# Patient Record
Sex: Female | Born: 1955 | Race: White | Hispanic: No | Marital: Married | Smoking: Never smoker
Health system: Southern US, Community
[De-identification: ages and names within clinical notes are randomized; demographics above are authoritative.]

## PROBLEM LIST (undated history)

## (undated) DIAGNOSIS — G4700 Insomnia, unspecified: Secondary | ICD-10-CM

## (undated) DIAGNOSIS — M8580 Other specified disorders of bone density and structure, unspecified site: Secondary | ICD-10-CM

## (undated) DIAGNOSIS — E785 Hyperlipidemia, unspecified: Secondary | ICD-10-CM

## (undated) HISTORY — DX: Other specified disorders of bone density and structure, unspecified site: M85.80

## (undated) HISTORY — PX: OTHER SURGICAL HISTORY: SHX169

## (undated) HISTORY — DX: Hyperlipidemia, unspecified: E78.5

## (undated) HISTORY — DX: Insomnia, unspecified: G47.00

---

## 1997-02-13 HISTORY — PX: OOPHORECTOMY: SHX86

## 1998-04-01 ENCOUNTER — Other Ambulatory Visit: Admission: RE | Admit: 1998-04-01 | Discharge: 1998-04-01 | Payer: Self-pay | Admitting: Obstetrics and Gynecology

## 1999-04-12 ENCOUNTER — Other Ambulatory Visit: Admission: RE | Admit: 1999-04-12 | Discharge: 1999-04-12 | Payer: Self-pay | Admitting: Obstetrics and Gynecology

## 2000-04-24 ENCOUNTER — Other Ambulatory Visit: Admission: RE | Admit: 2000-04-24 | Discharge: 2000-04-24 | Payer: Self-pay | Admitting: Obstetrics and Gynecology

## 2001-05-13 ENCOUNTER — Other Ambulatory Visit: Admission: RE | Admit: 2001-05-13 | Discharge: 2001-05-13 | Payer: Self-pay | Admitting: Obstetrics and Gynecology

## 2002-05-26 ENCOUNTER — Other Ambulatory Visit: Admission: RE | Admit: 2002-05-26 | Discharge: 2002-05-26 | Payer: Self-pay | Admitting: Obstetrics & Gynecology

## 2003-06-05 ENCOUNTER — Other Ambulatory Visit: Admission: RE | Admit: 2003-06-05 | Discharge: 2003-06-05 | Payer: Self-pay | Admitting: Obstetrics & Gynecology

## 2005-06-16 ENCOUNTER — Encounter: Admission: RE | Admit: 2005-06-16 | Discharge: 2005-06-16 | Payer: Self-pay | Admitting: Family Medicine

## 2005-07-09 ENCOUNTER — Emergency Department (HOSPITAL_COMMUNITY): Admission: EM | Admit: 2005-07-09 | Discharge: 2005-07-09 | Payer: Self-pay | Admitting: Emergency Medicine

## 2005-08-15 ENCOUNTER — Ambulatory Visit: Payer: Self-pay | Admitting: Family Medicine

## 2006-06-08 DIAGNOSIS — E785 Hyperlipidemia, unspecified: Secondary | ICD-10-CM

## 2006-06-08 DIAGNOSIS — G43909 Migraine, unspecified, not intractable, without status migrainosus: Secondary | ICD-10-CM | POA: Insufficient documentation

## 2006-06-08 DIAGNOSIS — F329 Major depressive disorder, single episode, unspecified: Secondary | ICD-10-CM

## 2006-06-08 DIAGNOSIS — F32A Depression, unspecified: Secondary | ICD-10-CM | POA: Insufficient documentation

## 2006-06-08 DIAGNOSIS — G4700 Insomnia, unspecified: Secondary | ICD-10-CM | POA: Insufficient documentation

## 2007-03-05 ENCOUNTER — Ambulatory Visit: Payer: Self-pay | Admitting: Family Medicine

## 2007-03-06 LAB — CONVERTED CEMR LAB
Basophils Relative: 0.6 % (ref 0.0–1.0)
Cholesterol: 184 mg/dL (ref 0–200)
Eosinophils Relative: 4.4 % (ref 0.0–5.0)
LDL Cholesterol: 101 mg/dL — ABNORMAL HIGH (ref 0–99)
Lymphocytes Relative: 24.5 % (ref 12.0–46.0)
MCHC: 35.2 g/dL (ref 30.0–36.0)
Monocytes Absolute: 0.3 10*3/uL (ref 0.2–0.7)
Neutro Abs: 3.5 10*3/uL (ref 1.4–7.7)
Platelets: 223 10*3/uL (ref 150–400)
RBC: 4.02 M/uL (ref 3.87–5.11)
TSH: 1.4 microintl units/mL (ref 0.35–5.50)
Total CHOL/HDL Ratio: 2.9

## 2007-03-07 ENCOUNTER — Encounter (INDEPENDENT_AMBULATORY_CARE_PROVIDER_SITE_OTHER): Payer: Self-pay | Admitting: *Deleted

## 2007-05-01 ENCOUNTER — Ambulatory Visit: Payer: Self-pay | Admitting: Gastroenterology

## 2007-08-07 ENCOUNTER — Ambulatory Visit: Payer: Self-pay | Admitting: Gastroenterology

## 2007-08-14 ENCOUNTER — Ambulatory Visit: Payer: Self-pay | Admitting: Gastroenterology

## 2007-08-14 HISTORY — PX: COLONOSCOPY: SHX174

## 2008-07-30 ENCOUNTER — Ambulatory Visit: Payer: Self-pay | Admitting: Family Medicine

## 2008-07-30 DIAGNOSIS — M25529 Pain in unspecified elbow: Secondary | ICD-10-CM | POA: Insufficient documentation

## 2008-07-30 LAB — CONVERTED CEMR LAB
ALT: 20 units/L (ref 0–35)
Alkaline Phosphatase: 46 units/L (ref 39–117)
Bilirubin, Direct: 0.1 mg/dL (ref 0.0–0.3)
CO2: 30 meq/L (ref 19–32)
Calcium: 8.8 mg/dL (ref 8.4–10.5)
Chloride: 103 meq/L (ref 96–112)
Creatinine, Ser: 0.7 mg/dL (ref 0.4–1.2)
Eosinophils Absolute: 0.4 10*3/uL (ref 0.0–0.7)
Eosinophils Relative: 6.7 % — ABNORMAL HIGH (ref 0.0–5.0)
HCT: 38.4 % (ref 36.0–46.0)
HDL: 60.3 mg/dL (ref >39.00)
Hemoglobin: 13.8 g/dL (ref 12.0–15.0)
LDL Cholesterol: 116 mg/dL — ABNORMAL HIGH (ref 0–99)
MCV: 94.8 fL (ref 78.0–100.0)
Monocytes Absolute: 0.4 10*3/uL (ref 0.1–1.0)
Neutro Abs: 4.1 10*3/uL (ref 1.4–7.7)
RBC: 4.05 M/uL (ref 3.87–5.11)
Sodium: 141 meq/L (ref 135–145)
Total CHOL/HDL Ratio: 3
WBC: 6.3 10*3/uL (ref 4.5–10.5)

## 2008-07-31 ENCOUNTER — Encounter (INDEPENDENT_AMBULATORY_CARE_PROVIDER_SITE_OTHER): Payer: Self-pay | Admitting: *Deleted

## 2009-03-05 ENCOUNTER — Ambulatory Visit: Payer: Self-pay | Admitting: Internal Medicine

## 2009-09-06 ENCOUNTER — Ambulatory Visit: Payer: Self-pay | Admitting: Family Medicine

## 2009-09-06 DIAGNOSIS — K1370 Unspecified lesions of oral mucosa: Secondary | ICD-10-CM | POA: Insufficient documentation

## 2009-09-07 ENCOUNTER — Encounter: Payer: Self-pay | Admitting: Family Medicine

## 2009-09-08 ENCOUNTER — Telehealth (INDEPENDENT_AMBULATORY_CARE_PROVIDER_SITE_OTHER): Payer: Self-pay | Admitting: *Deleted

## 2009-09-08 LAB — CONVERTED CEMR LAB
ALT: 32 units/L (ref 0–35)
AST: 21 units/L (ref 0–37)
Alkaline Phosphatase: 50 units/L (ref 39–117)
Basophils Absolute: 0 10*3/uL (ref 0.0–0.1)
Basophils Relative: 0.7 % (ref 0.0–3.0)
Calcium: 9.5 mg/dL (ref 8.4–10.5)
Chloride: 102 meq/L (ref 96–112)
Cholesterol: 256 mg/dL — ABNORMAL HIGH (ref 0–200)
Direct LDL: 170.5 mg/dL
Eosinophils Absolute: 0.2 10*3/uL (ref 0.0–0.7)
Eosinophils Relative: 3.7 % (ref 0.0–5.0)
GFR calc non Af Amer: 102.58 mL/min (ref >60)
Glucose, Bld: 89 mg/dL (ref 70–99)
HDL: 65.8 mg/dL (ref >39.00)
Monocytes Absolute: 0.3 10*3/uL (ref 0.1–1.0)
Monocytes Relative: 5.3 % (ref 3.0–12.0)
Neutro Abs: 3.4 10*3/uL (ref 1.4–7.7)
Platelets: 221 10*3/uL (ref 150.0–400.0)
RDW: 12.7 % (ref 11.5–14.6)
Total Bilirubin: 1 mg/dL (ref 0.3–1.2)
Total CHOL/HDL Ratio: 4
WBC: 5.7 10*3/uL (ref 4.5–10.5)

## 2009-11-19 ENCOUNTER — Ambulatory Visit: Payer: Self-pay | Admitting: Family Medicine

## 2009-11-23 LAB — CONVERTED CEMR LAB
Albumin: 5.2 g/dL (ref 3.5–5.2)
Alkaline Phosphatase: 53 units/L (ref 39–117)
Bilirubin, Direct: 0.2 mg/dL (ref 0.0–0.3)
Indirect Bilirubin: 0.7 mg/dL (ref 0.0–0.9)
Total Bilirubin: 0.9 mg/dL (ref 0.3–1.2)

## 2009-12-02 ENCOUNTER — Encounter: Payer: Self-pay | Admitting: Family Medicine

## 2009-12-02 ENCOUNTER — Ambulatory Visit: Payer: Self-pay | Admitting: Family Medicine

## 2009-12-02 DIAGNOSIS — D485 Neoplasm of uncertain behavior of skin: Secondary | ICD-10-CM

## 2010-01-24 ENCOUNTER — Telehealth (INDEPENDENT_AMBULATORY_CARE_PROVIDER_SITE_OTHER): Payer: Self-pay | Admitting: *Deleted

## 2010-02-15 ENCOUNTER — Ambulatory Visit
Admission: RE | Admit: 2010-02-15 | Discharge: 2010-02-15 | Payer: Self-pay | Source: Home / Self Care | Attending: Family Medicine | Admitting: Family Medicine

## 2010-02-15 DIAGNOSIS — M7580 Other shoulder lesions, unspecified shoulder: Secondary | ICD-10-CM

## 2010-03-02 ENCOUNTER — Telehealth: Payer: Self-pay | Admitting: Family Medicine

## 2010-03-07 ENCOUNTER — Ambulatory Visit: Admit: 2010-03-07 | Payer: Self-pay | Admitting: Family Medicine

## 2010-03-15 NOTE — Assessment & Plan Note (Signed)
Summary: growth in mouth//lch   Vital Signs:  Patient profile:   55 year old female Weight:      113 pounds BP sitting:   120 / 74  (left arm)  Vitals Entered By: Doristine Devoid CMA (September 06, 2009 9:48 AM) CC: sore in mouth x3 months    History of Present Illness: 55 yo woman here today for mouth sore.  first appeared 3 months ago.  'it's not bad today'.  'when something bothers it the whole area will get red'.  'it will look like a pimple'.  has never drained.  will get painful when 'it gets a whitehead'.  no fevers.  remote hx of cold sores.  has been evaluated by dentist- 'he told me not to worry about it'.  Problems Prior to Update: 1)  Other&unspecified Diseases The Oral Soft Tissues  (ICD-528.9) 2)  Elbow Pain, Left  (ICD-719.42) 3)  Screening Colorectal-cancer  (ICD-V76.51) 4)  Well Adult Exam  (ICD-V70.0) 5)  Family History Diabetes 1st Degree Relative  (ICD-V18.0) 6)  Insomnia  (ICD-780.52) 7)  Migraine Headache  (ICD-346.90) 8)  Hyperlipidemia  (ICD-272.4) 9)  Depression  (ICD-311)  Current Medications (verified): 1)  Aspirin 81 Mg  Tabs (Aspirin) .... One Tablet By Mouth Once Daily 2)  Fish Oil   Oil (Fish Oil) .... One Tablet By Mouth Once Daily 3)  Calcium Carbonate-Vitamin D 600-400 Mg-Unit  Tabs (Calcium Carbonate-Vitamin D) .... One Tablet By Mouth Once Daily 4)  Niacin 500 Mg  Tabs (Niacin) .... Take 1 Tablet By Mouth Once A Day 5)  Maxalt-Mlt 10 Mg Tbdp (Rizatriptan Benzoate) .Marland Kitchen.. 1 By Mouth Once Daily As Needed  Allergies (verified): 1)  ! Penicillin  Review of Systems      See HPI  Physical Exam  General:  alert, well-developed, and well-nourished.   Mouth:  <1 cm firm, pearly mass on hard palate.  nontender.   Impression & Recommendations:  Problem # 1:  OTHER&UNSPECIFIED DISEASES THE ORAL SOFT TISSUES (ICD-528.9) Assessment New small, firm mass on hard palate.  appears to be a cyst.  defer tx to dentist or peridontist.  Complete Medication  List: 1)  Aspirin 81 Mg Tabs (Aspirin) .... One tablet by mouth once daily 2)  Fish Oil Oil (Fish oil) .... One tablet by mouth once daily 3)  Calcium Carbonate-vitamin D 600-400 Mg-unit Tabs (Calcium carbonate-vitamin d) .... One tablet by mouth once daily 4)  Niacin 500 Mg Tabs (Niacin) .... Take 1 tablet by mouth once a day 5)  Maxalt-mlt 10 Mg Tbdp (Rizatriptan benzoate) .Marland Kitchen.. 1 by mouth once daily as needed  Other Orders: Venipuncture (04540) TLB-Lipid Panel (80061-LIPID) TLB-Hepatic/Liver Function Pnl (80076-HEPATIC) TLB-BMP (Basic Metabolic Panel-BMET) (80048-METABOL) TLB-CBC Platelet - w/Differential (85025-CBCD) TLB-TSH (Thyroid Stimulating Hormone) (84443-TSH) T-Vitamin D (25-Hydroxy) (98119-14782) Specimen Handling (95621)  Patient Instructions: 1)  Please schedule your physical at your convenience- you can eat before this appt 2)  Please discuss your mouth sore with your dentist- it appears to be a small cyst 3)  If you don't make any headway w/ your dentist- let me know- we can refer you to a peridontist.  Prevention & Chronic Care Immunizations   Influenza vaccine: Historical  (12/29/2008)    Tetanus booster: 02/13/1998: given    Pneumococcal vaccine: Not documented  Colorectal Screening   Hemoccult: Not documented    Colonoscopy: Location:  [facility].    (08/14/2007)   Colonoscopy due: 08/2017  Other Screening   Pap smear: Not  documented    Mammogram: normal  (11/02/2008)   Smoking status: never  (03/05/2009)  Lipids   Total Cholesterol: 200  (07/30/2008)   LDL: 116  (07/30/2008)   LDL Direct: Not documented   HDL: 60.30  (07/30/2008)   Triglycerides: 121.0  (07/30/2008)    SGOT (AST): 22  (07/30/2008)   SGPT (ALT): 20  (07/30/2008)   Alkaline phosphatase: 46  (07/30/2008)   Total bilirubin: 1.1  (07/30/2008)  Self-Management Support :    Lipid self-management support: Not documented

## 2010-03-15 NOTE — Assessment & Plan Note (Signed)
Summary: acute/ migraines nausea/ pt of Dr Tabori/alr   Vital Signs:  Patient profile:   55 year old female Weight:      115 pounds BMI:     20.12 O2 Sat:      99 % on Room air Temp:     97.9 degrees F oral Pulse rate:   70 / minute Pulse rhythm:   regular Resp:     20 per minute BP sitting:   100 / 70  (left arm) Cuff size:   regular  Vitals Entered By: Glendell Docker CMA (March 05, 2009 11:29 AM)  O2 Flow:  Room air  Primary Care Provider:  Neena Rhymes MD  CC:  Headache.  History of Present Illness: c/o headache since Wednesday, with sensitivity ot light, but not sound, nausea but no vomiting. She has taken one dose of Imitrex yesterday with no relief.  some sinus congestion.  it feels like she may have cold.  Preventive Screening-Counseling & Management  Alcohol-Tobacco     Smoking Status: never  Allergies: 1)  ! 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   Ears:  R ear normal and L ear normal.   Mouth:  No deformity or lesions, dentition normal. Neck:  No deformities, masses, or tenderness noted. Lungs:  Normal respiratory effort, chest expands symmetrically. Lungs are clear to auscultation, no crackles or wheezes. Heart:  Normal rate and regular rhythm. S1 and S2 normal without gallop, murmur, click, rub or other extra sounds. Neurologic:  cranial nerves II-XII intact and gait normal.     Impression &  Recommendations:  Problem # 1:  MIGRAINE HEADACHE (ICD-346.90) 55 y/o with refractory migraine.  phenergen 12.5 mg IM x 1.  pt advised to take advil and maxal mlt together.  use prednisone as directed.  Patient advised to call office if symptoms persist or worsen.  The following medications were removed from the medication list:    Imitrex 50 Mg Tabs (Sumatriptan succinate) .Marland Kitchen... Take 1 tablet by mouth two times a day as needed migraine Her updated medication list for this problem includes:    Aspirin 81 Mg Tabs (Aspirin) ..... One tablet by mouth once daily    Maxalt-mlt 10 Mg Tbdp (Rizatriptan benzoate) .Marland Kitchen... 1 by mouth once daily as needed  Complete Medication List: 1)  Aspirin 81 Mg Tabs (Aspirin) .... One tablet by mouth once daily 2)  Fish Oil Oil (Fish oil) .... One tablet by mouth once daily 3)  Calcium Carbonate-vitamin D 600-400 Mg-unit Tabs (Calcium carbonate-vitamin d) .... One tablet by mouth once daily 4)  Niacin 500 Mg Tabs (Niacin) .... Take 1 tablet by mouth once a day 5)  Maxalt-mlt 10 Mg Tbdp (Rizatriptan benzoate) .Marland Kitchen.. 1 by mouth once daily as needed 6)  Prednisone 10 Mg Tabs (Prednisone) .... One by mouth two times a day x 3 days, one by mouth once daily x 3 days, than 1/2 tab x 4 days  Patient Instructions: 1)  Call our office  if your symptoms do not  improve or gets worse. 2)  take 400-600 mg of ibuprofen with maxalt. Prescriptions: PREDNISONE 10 MG TABS (PREDNISONE) one by mouth two times a day x 3 days, one by mouth once daily x 3 days, than 1/2 tab x 4 days  #11 x 0   Entered and Authorized by:   D. Thomos Lemons DO   Signed by:   D. Thomos Lemons DO on 03/05/2009   Method used:   Electronically to        [facility] [phone]* (retail)       [address]       [place], [place]  [address]       Ph: [phone]       Fax: [phone]   RxID:   [id number]    Immunization History:  Influenza Immunization History:    Influenza:  historical  (12/29/2008)    Preventive Care Screening  Mammogram:    Date:  11/02/2008    Results:  normal    Current Allergies (reviewed today): ! PENICILLIN

## 2010-03-15 NOTE — Progress Notes (Signed)
Summary: labs-  Phone Note Outgoing Call   Call placed by: Doristine Devoid CMA,  September 08, 2009 11:38 AM Call placed to: Patient Summary of Call: LDL has increased from 116-->170.5.  needs to start Simvastatin 40mg  at bedtime and repeat LFTs in 6-8 weeks  Follow-up for Phone Call        left message on machine .......Marland KitchenDoristine Devoid CMA  September 08, 2009 11:39 AM   spoke w/ patient aware of labs and that medication to be started also mailed copy of labs........Marland KitchenDoristine Devoid CMA  September 08, 2009 4:14 PM     New/Updated Medications: SIMVASTATIN 40 MG TABS (SIMVASTATIN) take one tablet at bedtime Prescriptions: SIMVASTATIN 40 MG TABS (SIMVASTATIN) take one tablet at bedtime  #30 x 3   Entered by:   Doristine Devoid CMA   Authorized by:   Neena Rhymes MD   Signed by:   Doristine Devoid CMA on 09/08/2009   Method used:   Electronically to        [facility] [address] #[id number]* (retail)       [address]       [place], [place]  [address]       Ph: [phone]       Fax: [phone]   RxID:   [id number]

## 2010-03-15 NOTE — Assessment & Plan Note (Signed)
Summary: cpx/cbs   Vital Signs:  Patient profile:   55 year old female Height:      63.50 inches Weight:      115 pounds BMI:     20.12 Pulse rate:   115 / minute BP sitting:   102 / 60  (left arm)  Vitals Entered By: Doristine Devoid CMA (December 02, 2009 3:24 PM) CC: CPX   History of Present Illness: 55 yo woman here today for CPE.  GYN- Cousins  1) hyperlipidemia- tolerating statin w/out difficulty.  no N/V, abd pain, myalgias.  LFTs at last check were normal.  Preventive Screening-Counseling & Management  Alcohol-Tobacco     Alcohol drinks/day: <1     Smoking Status: never  Caffeine-Diet-Exercise     Does Patient Exercise: no      Sexual History:  currently monogamous.        Drug Use:  never.    Current Medications (verified): 1)  Aspirin 81 Mg  Tabs (Aspirin) .... One Tablet By Mouth Once Daily 2)  Fish Oil   Oil (Fish Oil) .... One Tablet By Mouth Once Daily 3)  Calcium Carbonate-Vitamin D 600-400 Mg-Unit  Tabs (Calcium Carbonate-Vitamin D) .... One Tablet By Mouth Once Daily 4)  Maxalt-Mlt 10 Mg Tbdp (Rizatriptan Benzoate) .Marland Kitchen.. 1 By Mouth Once Daily As Needed 5)  Simvastatin 40 Mg Tabs (Simvastatin) .... Take One Tablet At Bedtime  Allergies (verified): 1)  ! 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  Past Medical History: Reviewed history from Mar 10, 2009 and no changes required. Depression Hyperlipidemia      Past  Surgical History: Reviewed history from 2009/03/10 and no changes required. Left oopherectomy-dermoid cyst, 1999     Family History: Reviewed history from 2009/03/10 and no changes required. parkinson: Father- deceased at age 84 Family History Diabetes 1st degree relative: mother Family History High cholesterol Family History Hypertension      Social History: Reviewed history from 03/10/09 and no changes required. 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    Physical Exam  General:  alert, well-developed, and well-nourished.   Head:  Normocephalic and atraumatic. Eyes:  No corneal or conjunctival inflammation noted. EOMI. Perrla. Funduscopic exam benign, without hemorrhages, exudates or papilledema. Vision grossly normal. Ears:  R ear normal and L ear normal.   Nose:  External nasal examination shows no deformity or inflammation. Nasal mucosa are pink and moist without lesions or exudates. Mouth:  Oral mucosa and oropharynx without lesions or exudates.  Teeth in good repair. Neck:  No deformities, masses, or tenderness noted. Breasts:  deferred to gyn Lungs:  Normal  respiratory effort, chest expands symmetrically. Lungs are clear to auscultation, no crackles or wheezes. Heart:  Normal rate and regular rhythm. S1 and S2 normal without gallop, murmur, click, rub or other extra sounds. Abdomen:  Soft, nontender and nondistended. No masses, hepatosplenomegaly or  hernias noted. Normal bowel sounds. Genitalia:  deferred to gyn Pulses:  +2 carotid, radial, DP Extremities:  No clubbing, cyanosis, edema or deformities noted. Neurologic:  No cranial nerve deficits noted. Station and gait are normal. Plantar reflexes are down-going bilaterally. DTRs are symmetrical throughout. Sensory, motor and coordinative functions appear intact. Skin:  hyperpigmented mole on R lateral thigh Cervical Nodes:  No lymphadenopathy noted Axillary Nodes:  No palpable lymphadenopathy Psych:  Cognition and judgment appear intact. Alert and cooperative with normal attention span and concentration. No apparent delusions, illusions, hallucinations   Impression & Recommendations:  Problem # 1:  WELL ADULT EXAM (ICD-V70.0) Assessment Unchanged  PE WNL.  UTD on health maintainence.  anticipatory guidance provided. EKG done as baseline.  Orders: EKG w/ Interpretation (93000)  Problem # 2:  HYPERLIPIDEMIA (ICD-272.4) Assessment: Unchanged tolerating statin w/out difficulty.  continue.  recheck in 6 months. The following medications were removed from the medication list:    Niacin 500 Mg Tabs (Niacin) .Marland Kitchen... Take 1 tablet by mouth once a day Her updated medication list for this problem includes:    Simvastatin 40 Mg Tabs (Simvastatin) .Marland Kitchen... Take one tablet at bedtime  Problem # 3:  NEOPLASM OF UNCERTAIN BEHAVIOR OF SKIN (ICD-238.2) Assessment: New  hyperpigmented mole on R lateral thigh.  refer to derm  Orders: Dermatology Referral (Derma)  Complete Medication List: 1)  Aspirin 81 Mg Tabs (Aspirin) .... One tablet by mouth once daily 2)  Fish Oil Oil (Fish oil) .... One tablet by mouth once daily 3)  Calcium Carbonate-vitamin D 600-400 Mg-unit Tabs (Calcium carbonate-vitamin d) .... One tablet by mouth once daily 4)  Maxalt-mlt 10 Mg Tbdp (Rizatriptan benzoate) .Marland Kitchen.. 1 by mouth once daily as needed 5)  Simvastatin 40 Mg Tabs (Simvastatin) .... Take one tablet at  bedtime  Patient Instructions: 1)  Recheck cholesterol in 6 months- do not eat before this appt 2)  Your exam looks great!  Keep up the good work! 3)  We'll refer you to derm for your mole 4)  Call with any questions or concerns 5)  Have a great holiday season!!!   Orders Added: 1)  Est. Patient 40-64 years [99396] 2)  EKG w/ Interpretation [93000] 3)  Dermatology Referral [Derma]

## 2010-03-17 NOTE — Progress Notes (Signed)
Summary: simvastatin refill  Phone Note Refill Request Message from:  Fax from Pharmacy on January 24, 2010 10:07 AM  Refills Requested: Medication #1:  SIMVASTATIN 40 MG TABS take one tablet at bedtime.   Last Refilled: 12/23/2009 [facility], [address], [place], [place]   phone=[phone]    fax = [phone]  qty - 30  Next Appointment Scheduled: none Initial call taken by: Jerolyn Shin,  January 24, 2010 10:17 AM    Prescriptions: SIMVASTATIN 40 MG TABS (SIMVASTATIN) take one tablet at bedtime  #30 x 3   Entered by:   Doristine Devoid CMA   Authorized by:   Neena Rhymes MD   Signed by:   Doristine Devoid CMA on 01/24/2010   Method used:   Electronically to        [facility] [address] #[id number]* (retail)       [address]       [place], [place]  [address]       Ph: [phone]       Fax: [phone]   RxID:   [id number]

## 2010-03-17 NOTE — Assessment & Plan Note (Signed)
Summary: LEFT SHOLDER PAIN/KN   Vital Signs:  Patient profile:   55 year old female Weight:      119 pounds BMI:     20.82 BP sitting:   100 / 64  (left arm)  Vitals Entered By: Doristine Devoid CMA (February 15, 2010 4:03 PM) CC: L shoulder pain xmonths worse w/ movement   History of Present Illness: 55 yo woman here today for L shoulder pain.  sxs started  ~2 months ago.  initially pain was located at head of humerus but has been progressively worsening and now is travelling down to elbow and in towards chest and collarbone.  difficulty yesterday picking up coffee cup.  denies weakness in arm but has limited motion due to pain.  no injury that she can recall.  has not been using NSAIDs.  unable to lift arm or reach behind back.  Current Medications (verified): 1)  Aspirin 81 Mg  Tabs (Aspirin) .... One Tablet By Mouth Once Daily 2)  Fish Oil   Oil (Fish Oil) .... One Tablet By Mouth Once Daily 3)  Calcium Carbonate-Vitamin D 600-400 Mg-Unit  Tabs (Calcium Carbonate-Vitamin D) .... One Tablet By Mouth Once Daily 4)  Maxalt-Mlt 10 Mg Tbdp (Rizatriptan Benzoate) .Marland Kitchen.. 1 By Mouth Once Daily As Needed 5)  Simvastatin 40 Mg Tabs (Simvastatin) .... Take One Tablet At Bedtime  Allergies (verified): 1)  ! Penicillin  Review of Systems      See HPI  Physical Exam  General:  alert, well-developed, and well-nourished.   Msk:  L shoulder normal to inspection and palpation + hawkings and neers.  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  if no improvement will refer for injxn.  reviewed supportive care and red flags that should prompt return.  Pt expresses understanding and is in agreement w/ this plan.  Complete Medication List: 1)  Aspirin 81 Mg Tabs (Aspirin) .... One tablet by mouth once daily 2)  Fish Oil Oil  (Fish oil) .... One tablet by mouth once daily 3)  Calcium Carbonate-vitamin D 600-400 Mg-unit Tabs (Calcium carbonate-vitamin d) .... One tablet by mouth once daily 4)  Maxalt-mlt 10 Mg Tbdp (Rizatriptan benzoate) .Marland Kitchen.. 1 by mouth once daily as needed 5)  Simvastatin 40 Mg Tabs (Simvastatin) .... Take one tablet at bedtime 6)  Naprosyn 500 Mg Tabs (Naproxen) .Marland Kitchen.. 1 two times a day x7-10 days and then as needed.  take w/ food.  Patient Instructions: 1)  You have shoulder impingement 2)  This should improve w/ anti-inflammatories.  If no improvement in pain or motion in the next 10 days- call me and we'll send you to sports med 3)  Take the Naprosyn as directed- take w/ food 4)  ICE! (or heat) 5)  Call with any questions or concerns 6)  Hang in there! 7)  Happy New Year! Prescriptions: NAPROSYN 500 MG TABS (NAPROXEN) 1 two times a day x7-10 days and then as needed.  take w/ food.  #60 x 0   Entered and Authorized by:   Neena Rhymes MD   Signed by:   Neena Rhymes MD on 02/15/2010   Method used:   Electronically to        [facility] [address] [facility] [address] #[id number]* (retail)       [address]  [place], [place]  [address]       Ph: [phone]       Fax: [phone]   RxID:   [id number]    Orders Added: 1)  Est. Patient Level III [59563] 2)  Est. Patient Level III [87564]

## 2010-03-17 NOTE — Progress Notes (Signed)
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

## 2010-03-28 ENCOUNTER — Encounter: Payer: Self-pay | Admitting: Family Medicine

## 2010-03-28 ENCOUNTER — Ambulatory Visit (INDEPENDENT_AMBULATORY_CARE_PROVIDER_SITE_OTHER): Payer: BC Managed Care – PPO | Admitting: Family Medicine

## 2010-04-06 NOTE — Assessment & Plan Note (Signed)
Summary: LEFT SHOULDER IMPINGEMENT SYNDROME/NP/LP/MJD   Vital Signs:  Patient profile:   55 year old female Height:      63 inches (160.02 cm) Weight:      116.2 pounds (52.82 kg) BMI:     20.66 Temp:     98.0 degrees F (36.67 degrees C) oral Pulse rate:   61 / minute BP sitting:   115 / 77  (right arm)  Vitals Entered By: Baxter Hire) (March 28, 2010 4:26 PM) CC: Left shoulder impingement  Pain Assessment Patient in pain? yes     Location: Lt. shoulder Intensity: 2 Onset of pain  gets worse with movement Nutritional Status BMI of 19 -24 = normal  Does patient need assistance? Functional Status Self care Ambulation Normal   Primary Care Provider:  Neena Rhymes MD  CC:  Left shoulder impingement .  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 Has minimally improved since then. Not yet tried PT, home exercises, injection. Pain worse when picking things up with arm extended, doing overhead activities. Can wake her up at night at times. No numbness or tingling. Is left handed No remote issues with left shoulder.  No right shoulder pain.  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 One Tablet By Mouth Once Daily 2)  Fish Oil   Oil (Fish Oil) .... One Tablet By Mouth Once Daily 3)  Calcium Carbonate-Vitamin D 600-400 Mg-Unit  Tabs (Calcium Carbonate-Vitamin D) .... One Tablet By Mouth Once Daily 4)  Maxalt-Mlt 10 Mg Tbdp (Rizatriptan Benzoate) .Marland Kitchen.. 1 By Mouth Once Daily As Needed 5)  Simvastatin 40 Mg Tabs (Simvastatin) .... Take One Tablet At Bedtime 6)  Naprosyn 500 Mg Tabs (Naproxen) .Marland Kitchen.. 1 Two Times A Day X7-10 Days and Then As Needed.  Take W/ Food.  Allergies: 1)  ! Penicillin  Family History: Reviewed history from 03/05/2009 and no changes required. parkinson: Father- deceased at age 4 Family History Diabetes 1st degree relative: mother Family History High cholesterol Family History Hypertension      Social History: Reviewed history from 03/05/2009 and no changes required. 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   Msk:  L shoulder: No gross deformity, swelling, bruising. No TTP at [facility] joint or focally about shoulder. FROM with painful arc. Strength 4+/5 with empty can and painful (strength limited  by pain), 5/5 with resisted IR/ER - pain with ER. + Hawkins and Neers NVI distally  R shoulder: No gross deformity, swelling, bruising. FROM without tenderness.   Impression & Recommendations:  Problem # 1:  SHOULDER IMPINGEMENT SYNDROME, LEFT (ICD-726.2) Assessment Unchanged Agree with diagnosis of left shoulder impingement syndrome.  Decided to proceed with PT and subacromial cortisone injection today.  Try to avoid overhead activities, reaching, lifting with  extended arm when possible.  Continue naprosyn.  F/u in 1 month, u/s if not improving.  After informed written consent patient was seated on the exam table and left shoulder was prepped with alcohol swab.  Utilizing a posterior approach, left subacromial space was injected with 3:1 marcaine:depomedrol.  Patient tolerated the procedure well without any immediate complications.   Orders: Joint Aspirate / Injection, Large (20610)  Complete Medication List: 1)  Aspirin 81 Mg Tabs (Aspirin) .... One tablet by mouth once daily 2)  Fish Oil Oil (Fish oil) .... One tablet by mouth once daily 3)  Calcium Carbonate-vitamin D 600-400 Mg-unit Tabs (Calcium carbonate-vitamin d) .... One tablet by mouth once daily 4)  Maxalt-mlt 10 Mg Tbdp (Rizatriptan benzoate) .Marland Kitchen.. 1 by mouth once daily as needed 5)  Simvastatin 40 Mg Tabs (Simvastatin) .... Take one tablet at bedtime 6)  Naprosyn 500 Mg Tabs (Naproxen) .Marland Kitchen.. 1 two times a day x7-10 days and then as needed.  take w/ food.  Patient Instructions: 1)  You have rotator cuff impingement/subacromial bursitis. 2)  Try to avoid painful activities (overhead activities, lifting with extended arm) as much as possible. 3)  Naproxen twice a day as you have been for pain for next week then as needed. 4)  Start physical therapy after 5-7 more days. 5)  Transition to home program when able. 6)  Follow up with me in 1 month for recheck on your progress. 7)  If not improving we can consider ultrasound to assess your rotator cuff further here in the office.   Orders Added: 1)  New Patient Level III [04540] 2)  Joint Aspirate / Injection, Large [20610]

## 2010-04-11 ENCOUNTER — Ambulatory Visit: Payer: BC Managed Care – PPO | Attending: Family Medicine | Admitting: Physical Therapy

## 2010-04-11 DIAGNOSIS — M25519 Pain in unspecified shoulder: Secondary | ICD-10-CM | POA: Insufficient documentation

## 2010-04-11 DIAGNOSIS — R293 Abnormal posture: Secondary | ICD-10-CM | POA: Insufficient documentation

## 2010-04-11 DIAGNOSIS — M25619 Stiffness of unspecified shoulder, not elsewhere classified: Secondary | ICD-10-CM | POA: Insufficient documentation

## 2010-04-11 DIAGNOSIS — IMO0001 Reserved for inherently not codable concepts without codable children: Secondary | ICD-10-CM | POA: Insufficient documentation

## 2010-04-13 ENCOUNTER — Ambulatory Visit: Payer: BC Managed Care – PPO

## 2010-04-19 ENCOUNTER — Ambulatory Visit: Payer: BC Managed Care – PPO | Attending: Family Medicine | Admitting: Rehabilitation

## 2010-04-19 DIAGNOSIS — M25619 Stiffness of unspecified shoulder, not elsewhere classified: Secondary | ICD-10-CM | POA: Insufficient documentation

## 2010-04-19 DIAGNOSIS — M25519 Pain in unspecified shoulder: Secondary | ICD-10-CM | POA: Insufficient documentation

## 2010-04-19 DIAGNOSIS — IMO0001 Reserved for inherently not codable concepts without codable children: Secondary | ICD-10-CM | POA: Insufficient documentation

## 2010-04-19 DIAGNOSIS — R293 Abnormal posture: Secondary | ICD-10-CM | POA: Insufficient documentation

## 2010-04-21 ENCOUNTER — Ambulatory Visit: Payer: BC Managed Care – PPO | Admitting: Physical Therapy

## 2010-04-23 ENCOUNTER — Encounter: Payer: Self-pay | Admitting: *Deleted

## 2010-04-26 ENCOUNTER — Ambulatory Visit: Payer: BC Managed Care – PPO | Admitting: Physical Therapy

## 2010-04-28 ENCOUNTER — Encounter: Payer: Self-pay | Admitting: Family Medicine

## 2010-04-28 ENCOUNTER — Ambulatory Visit (INDEPENDENT_AMBULATORY_CARE_PROVIDER_SITE_OTHER): Payer: BC Managed Care – PPO | Admitting: Family Medicine

## 2010-05-03 ENCOUNTER — Ambulatory Visit: Payer: BC Managed Care – PPO | Admitting: Physical Therapy

## 2010-05-03 NOTE — Assessment & Plan Note (Signed)
Summary: FOLLOW UP SHOULDER/MJD [phone]   Vital Signs:  Patient profile:   55 year old female Height:      63 inches (160.02 cm) Weight:      115 pounds (52.27 kg) BMI:     20.44 Temp:     98.4 degrees F (36.89 degrees C) oral Pulse rate:   65 / minute BP sitting:   106 / 66  (right arm)  Vitals Entered By: Baxter Hire) (April 28, 2010 4:18 PM) CC: follow-up visit Pain Assessment Patient in pain? no      Nutritional Status BMI of 19 -24 = normal  Does patient need assistance? Functional Status Self care Ambulation Normal   Primary Care Provider:  Neena Rhymes MD  CC:  follow-up visit.  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 At last OV did subacromial injection which patient states did not help even initially. Has done 5 visits of PT so far which seem to have helped though sore after working with them. Noted she has done mostly just range of motion exercises though. Pain worse when picking things up with arm extended, doing overhead activities. Can wake her up at night at times. No numbness or tingling. Is left handed No remote issues with left shoulder.  No right shoulder pain. Is taking ibuprofen and icing as well   Habits & Providers  Alcohol-Tobacco-Diet     Alcohol drinks/day: occassionally     Tobacco Status: never  Current Problems (verified): 1)  Shoulder Impingement Syndrome, Left  (ICD-726.2) 2)  Neoplasm of Uncertain Behavior of Skin  (ICD-238.2) 3)  Other&unspecified Diseases The Oral Soft Tissues  (ICD-528.9) 4)  Elbow Pain, Left  (ICD-719.42) 5)  Screening Colorectal-cancer   (ICD-V76.51) 6)  Well Adult Exam  (ICD-V70.0) 7)  Family History Diabetes 1st Degree Relative  (ICD-V18.0) 8)  Insomnia  (ICD-780.52) 9)  Migraine Headache  (ICD-346.90) 10)  Hyperlipidemia  (ICD-272.4) 11)  Depression  (ICD-311)  Medications Prior to Update: 1)  Aspirin 81 Mg  Tabs (Aspirin) .... One Tablet By Mouth Once Daily 2)  Fish Oil   Oil (Fish Oil) .... One Tablet By Mouth Once Daily 3)  Calcium Carbonate-Vitamin D 600-400 Mg-Unit  Tabs (Calcium Carbonate-Vitamin D) .... One Tablet By Mouth Once Daily 4)  Maxalt-Mlt 10 Mg Tbdp (Rizatriptan Benzoate) .Marland Kitchen.. 1 By Mouth Once Daily As Needed 5)  Simvastatin 40 Mg Tabs (Simvastatin) .... Take One Tablet At Bedtime 6)  Naprosyn 500 Mg Tabs (Naproxen) .Marland Kitchen.. 1 Two Times A Day X7-10 Days and Then As Needed.  Take W/ Food.  Allergies: 1)  ! Penicillin  Physical Exam  General:  alert, well-developed, and well-nourished.   Msk:  L shoulder: No gross deformity, swelling, bruising. No TTP at [facility] joint or focally about shoulder. FROM with painful arc. Strength 4+/5 with empty can and painful (strength limited by pain), 5/5 with resisted IR/ER - pain with ER. + Hawkins and Neers NVI distally  R shoulder: No gross deformity, swelling, bruising. FROM without tenderness. Additional Exam:  MSK u/s: L shoulder supraspinatus in long view appears intact at its insertion with overlying thickened and swollen  bursa.  No calcifications in long view.  In trans view hypoechoic area seen that is not visualized in long view that could represent a small partial tear.  Impingement is evident with abduction of supraspinatus on acromion.  Images saved for documentation.   Impression & Recommendations:  Problem # 1:  SHOULDER IMPINGEMENT SYNDROME, LEFT (ICD-726.2) Assessment Improved Slightly improved.  No full thickness rotator cuff visualized on ultrasound.  Will continue with physical therapy.  Consider MRI if not improving after 1 month of conservative  therapy.  Can consider repeat injection in future but had no benefit from this so doubtful this will help.  Given theraband and showed strengthening exercises in addition to her stretching in PT.  Complete Medication List: 1)  Aspirin 81 Mg Tabs (Aspirin) .... One tablet by mouth once daily 2)  Fish Oil Oil (Fish oil) .... One tablet by mouth once daily 3)  Calcium Carbonate-vitamin D 600-400 Mg-unit Tabs (Calcium carbonate-vitamin d) .... One tablet by mouth once daily 4)  Maxalt-mlt 10 Mg Tbdp (Rizatriptan benzoate) .Marland Kitchen.. 1 by mouth once daily as needed 5)  Simvastatin 40 Mg Tabs (Simvastatin) .... Take one tablet at bedtime 6)  Naprosyn 500 Mg Tabs (Naproxen) .Marland Kitchen.. 1 two times a day x7-10 days and then as needed.  take w/ food.  Patient Instructions: 1)  You have rotator cuff impingement. 2)  Try to avoid painful activities (overhead activities, lifting with extended arm) as much as possible. 3)  Naproxen as needed and before you go to physical therapy. 4)  Continue physical therapy but also do the exercises I showed you once a day. 5)  Follow up with me in 1 month for recheck on your progress. 6)  If not improving at that point we will order an MRI.   Orders Added: 1)  Est. Patient Level III [16109]

## 2010-05-05 ENCOUNTER — Encounter: Payer: BC Managed Care – PPO | Admitting: Physical Therapy

## 2010-05-10 ENCOUNTER — Ambulatory Visit: Payer: BC Managed Care – PPO | Admitting: Physical Therapy

## 2010-05-12 ENCOUNTER — Encounter: Payer: BC Managed Care – PPO | Admitting: Physical Therapy

## 2010-05-17 ENCOUNTER — Other Ambulatory Visit (INDEPENDENT_AMBULATORY_CARE_PROVIDER_SITE_OTHER): Payer: BC Managed Care – PPO

## 2010-05-17 ENCOUNTER — Ambulatory Visit: Payer: BC Managed Care – PPO | Attending: Family Medicine | Admitting: Physical Therapy

## 2010-05-17 DIAGNOSIS — IMO0001 Reserved for inherently not codable concepts without codable children: Secondary | ICD-10-CM | POA: Insufficient documentation

## 2010-05-17 DIAGNOSIS — R293 Abnormal posture: Secondary | ICD-10-CM | POA: Insufficient documentation

## 2010-05-17 DIAGNOSIS — M25619 Stiffness of unspecified shoulder, not elsewhere classified: Secondary | ICD-10-CM | POA: Insufficient documentation

## 2010-05-17 DIAGNOSIS — E785 Hyperlipidemia, unspecified: Secondary | ICD-10-CM

## 2010-05-17 DIAGNOSIS — M25519 Pain in unspecified shoulder: Secondary | ICD-10-CM | POA: Insufficient documentation

## 2010-05-18 ENCOUNTER — Encounter: Payer: Self-pay | Admitting: *Deleted

## 2010-05-18 LAB — LIPID PANEL: Cholesterol: 162 mg/dL (ref 0–200)

## 2010-05-18 LAB — HEPATIC FUNCTION PANEL
Total Bilirubin: 0.5 mg/dL (ref 0.3–1.2)
Total Protein: 6.4 g/dL (ref 6.0–8.3)

## 2010-05-23 ENCOUNTER — Ambulatory Visit: Payer: BC Managed Care – PPO | Admitting: Physical Therapy

## 2010-05-31 ENCOUNTER — Ambulatory Visit: Payer: BC Managed Care – PPO | Admitting: Physical Therapy

## 2010-06-02 ENCOUNTER — Ambulatory Visit: Payer: BC Managed Care – PPO | Admitting: Family Medicine

## 2010-06-02 ENCOUNTER — Ambulatory Visit: Payer: BC Managed Care – PPO | Admitting: Physical Therapy

## 2010-06-07 ENCOUNTER — Other Ambulatory Visit: Payer: Self-pay | Admitting: *Deleted

## 2010-06-07 ENCOUNTER — Ambulatory Visit: Payer: BC Managed Care – PPO | Admitting: Physical Therapy

## 2010-06-07 MED ORDER — SIMVASTATIN 40 MG PO TABS: 40.0000 mg | ORAL_TABLET | Freq: Every day | ORAL | Status: DC

## 2010-06-07 NOTE — Telephone Encounter (Signed)
Pt labs were just done, sent refills.

## 2010-06-09 ENCOUNTER — Encounter: Payer: BC Managed Care – PPO | Admitting: Physical Therapy

## 2010-06-14 ENCOUNTER — Ambulatory Visit: Payer: BC Managed Care – PPO | Admitting: Family Medicine

## 2010-06-20 ENCOUNTER — Encounter: Payer: Self-pay | Admitting: Family Medicine

## 2010-06-20 ENCOUNTER — Ambulatory Visit (INDEPENDENT_AMBULATORY_CARE_PROVIDER_SITE_OTHER): Payer: BC Managed Care – PPO | Admitting: Family Medicine

## 2010-06-20 DIAGNOSIS — M25512 Pain in left shoulder: Secondary | ICD-10-CM

## 2010-06-20 DIAGNOSIS — M25519 Pain in unspecified shoulder: Secondary | ICD-10-CM

## 2010-06-21 ENCOUNTER — Encounter: Payer: Self-pay | Admitting: Family Medicine

## 2010-06-21 DIAGNOSIS — M25512 Pain in left shoulder: Secondary | ICD-10-CM | POA: Insufficient documentation

## 2010-06-21 NOTE — Assessment & Plan Note (Signed)
patient has not improved with conservative treatment over past 3 months of NSAIDs (naproxen, ibuprofen), icing, home exercises, formal PT x 8 weeks, cortisone injection (subacromial).  Will proceed with MRI to further assess.  If no evidence of tear, advised can consider trying cortisone injection again - had no relief with first one however.  Will call her with results of MRI and how to proceed. 

## 2010-06-21 NOTE — Progress Notes (Signed)
Subjective:    Patient ID: Dominique Harrell, female    DOB: 14-Sep-1955, 55 y.o.   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 Was not improving so proceeded with subacromial cortisone injection which patient states did not help even initially. She has completed 8 weeks of PT going approximately 2x/week. Had mild improvement initially but pain has worsened since her last visit here. Pain still worse when picking things up with arm extended, doing overhead activities. Can wake her up at night at times. No numbness or tingling. Is left handed No remote issues with left shoulder.  No right shoulder pain. Tried ibuprofen, naproxen, icing without much benefit.  Past Medical History  Diagnosis Date  . Migraine   . Insomnia   . Hyperlipidemia     Current Outpatient Prescriptions on File Prior to Visit  Medication Sig Dispense Refill  . aspirin 81 MG tablet Take 81 mg by mouth daily.        . Calcium Carbonate-Vit D-Min 600-400 MG-UNIT TABS Take 1 tablet by mouth daily.        . naproxen (NAPROSYN) 500 MG tablet 1 two times a day x7-10 days and then as needed.  take w/ food.       . Omega-3 Fatty Acids (FISH OIL CONCENTRATE PO) Take 1 tablet by mouth daily.        . rizatriptan (MAXALT-MLT) 10 MG disintegrating tablet Take 10 mg by mouth daily. May repeat in 2 hours if needed       . simvastatin (ZOCOR) 40 MG tablet Take 1 tablet (40 mg total) by mouth at bedtime.  30 tablet  6    No past surgical history on file.  Allergies  Allergen Reactions  . Penicillins     History   Social History  . Marital Status: Married    Spouse Name: N/A   Number of Children: N/A  . Years of Education: N/A   Occupational History  . Not on file.   Social History Main Topics  . Smoking status: Never Smoker   . Smokeless tobacco: Not on file  . Alcohol Use: Not on file  . Drug Use: Not on file  . Sexually Active: Not on file   Other Topics Concern  . Not on file   Social History Narrative  . No narrative on file    Family History  Problem Relation Age of Onset  . Diabetes Mother   . Hyperlipidemia Other   . Hypertension Other     BP 105/67  Pulse 69  Temp(Src) 98.3 F (36.8 C) (Oral)  Ht 5\' 3"  (1.6 m)  Wt 115 lb (52.164 kg)  BMI 20.37 kg/m2  Review of Systems See HPI above.    Objective:   Physical Exam  Gen: alert, well-developed, and well-nourished.    L shoulder: No gross deformity, swelling, bruising. No TTP at [facility] joint or focally about shoulder. FROM with painful arc. Strength 4+/5 with empty can and painful (strength limited by pain), 5/5 with resisted IR/ER - minimal pain with ER. + Hawkins and Neers NVI distally  R shoulder: No gross  deformity, swelling, bruising. FROM without tenderness.     Assessment & Plan:  1. Left shoulder pain - patient has not improved with conservative treatment over past 3 months of NSAIDs (naproxen, ibuprofen), icing, home exercises, formal PT x 8 weeks, cortisone injection (subacromial).  Will proceed with MRI to further assess.  If no evidence of tear, advised can consider trying cortisone injection again - had no relief with first one however.  Will call her with results of MRI and how to proceed.

## 2010-06-21 NOTE — Assessment & Plan Note (Signed)
patient has not improved with conservative treatment over past 3 months of NSAIDs (naproxen, ibuprofen), icing, home exercises, formal PT x 8 weeks, cortisone injection (subacromial).  Will proceed with MRI to further assess.  If no evidence of tear, advised can consider trying cortisone injection again - had no relief with first one however.  Will call her with results of MRI and how to proceed.

## 2010-06-22 ENCOUNTER — Ambulatory Visit (HOSPITAL_BASED_OUTPATIENT_CLINIC_OR_DEPARTMENT_OTHER)
Admission: RE | Admit: 2010-06-22 | Discharge: 2010-06-22 | Disposition: A | Discharge status: Home / Self Care | Payer: BC Managed Care – PPO | Source: Ambulatory Visit | Attending: Family Medicine | Admitting: Family Medicine

## 2010-06-22 DIAGNOSIS — M19019 Primary osteoarthritis, unspecified shoulder: Secondary | ICD-10-CM | POA: Insufficient documentation

## 2010-06-22 DIAGNOSIS — M25512 Pain in left shoulder: Secondary | ICD-10-CM

## 2010-06-22 DIAGNOSIS — M25519 Pain in unspecified shoulder: Secondary | ICD-10-CM

## 2010-06-23 ENCOUNTER — Ambulatory Visit: Payer: BC Managed Care – PPO | Admitting: Family Medicine

## 2010-07-07 ENCOUNTER — Encounter: Payer: Self-pay | Admitting: Family Medicine

## 2010-07-07 ENCOUNTER — Ambulatory Visit (INDEPENDENT_AMBULATORY_CARE_PROVIDER_SITE_OTHER): Payer: BC Managed Care – PPO | Admitting: Family Medicine

## 2010-07-07 VITALS — BP 107/71 | HR 80 | Temp 98.6°F | Ht 64.0 in | Wt 115.0 lb

## 2010-07-07 NOTE — Progress Notes (Signed)
Subjective:    Patient ID: Dominique Harrell, female    DOB: Jul 01, 1955, 55 y.o.   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 Was not improving so proceeded with subacromial cortisone injection which patient states did not help even initially. She has completed 8 weeks of PT going approximately 2x/week. Had mild improvement initially but pain has worsened since her last visit here. Pain still worse when picking things up with arm extended, doing overhead activities. Can wake her up at night at times. No numbness or tingling. Is left handed No remote issues with left shoulder.  No right shoulder pain. Tried ibuprofen, naproxen, icing without much benefit.  Today: Patient came in today because pain hadn't been improving last we had talked and MRI was performed - showed downsloping acromion, possible small labral tear, supraspinatus tendinopathy but otherwise normal. She was having a lot of pain until 1 week ago and has done great since then. Done lots of overhead activities today so more sore today but still significantly better than a week ago. Today's visit was to repeat cortisone injection into subacromial and intraarticular space but discussed since she has improved significantly we should hold off on this.  Past Medical History  Diagnosis Date  . Migraine   . Insomnia   . Hyperlipidemia     Current Outpatient Prescriptions on File Prior to Visit  Medication Sig Dispense Refill  . aspirin 81 MG tablet Take 81 mg by mouth daily.        . Calcium Carbonate-Vit D-Min 600-400 MG-UNIT TABS Take 1 tablet by mouth daily.        .  naproxen (NAPROSYN) 500 MG tablet 1 two times a day x7-10 days and then as needed.  take w/ food.       . Omega-3 Fatty Acids (FISH OIL CONCENTRATE PO) Take 1 tablet by mouth daily.        . rizatriptan (MAXALT-MLT) 10 MG disintegrating tablet Take 10 mg by mouth daily. May repeat in 2 hours if needed       . simvastatin (ZOCOR) 40 MG tablet Take 1 tablet (40 mg total) by mouth at bedtime.  30 tablet  6    No past surgical history on file.  Allergies  Allergen Reactions  . Penicillins     History   Social History  . Marital Status: Married    Spouse Name: N/A    Number of Children: N/A  . Years of Education: N/A   Occupational History  . Not on file.   Social History Main Topics  . Smoking status: Never Smoker   . Smokeless tobacco: Not on file  . Alcohol Use: Not on file  . Drug Use: Not on file  . Sexually Active: Not on file   Other Topics Concern  . Not on file   Social History Narrative  . No narrative on file    Family History  Problem Relation Age of Onset  . Diabetes Mother   . Hyperlipidemia Other   . Hypertension Other     BP 107/71  Pulse 80  Temp(Src)  98.6 F (37 C) (Oral)  Ht 5\' 4"  (1.626 m)  Wt 115 lb (52.164 kg)  BMI 19.74 kg/m2  Review of Systems  See HPI above.    Objective:   Physical Exam   Gen: alert, well-developed, and well-nourished.   Below is exam from visit 3 weeks ago: L shoulder: No gross deformity, swelling, bruising. No TTP at [facility] joint or focally about shoulder. FROM with painful arc. Strength 4+/5 with empty can and painful (strength limited by pain), 5/5 with resisted IR/ER - minimal pain with ER. + Hawkins and Neers NVI distally  R shoulder: No gross deformity, swelling, bruising. FROM without tenderness.     Assessment & Plan:  1. Left shoulder pain - patient to continue with home exercises, icing, NSAIDs.  If not improving will proceed with injection and advised her she can do this at any point.  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

## 2010-07-07 NOTE — Assessment & Plan Note (Signed)
patient to continue with home exercises, icing, NSAIDs.  If not improving will proceed with injection and advised her she can do this at any point.  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

## 2010-09-01 ENCOUNTER — Encounter: Payer: Self-pay | Admitting: Family Medicine

## 2011-01-27 ENCOUNTER — Other Ambulatory Visit: Payer: Self-pay | Admitting: Family Medicine

## 2011-01-27 MED ORDER — SIMVASTATIN 40 MG PO TABS: 40.0000 mg | ORAL_TABLET | Freq: Every day | ORAL | Status: DC

## 2011-01-27 NOTE — Telephone Encounter (Signed)
rx sent to pharmacy by e-script For #30 no refill per pt has not been seen in Office since 1 3 2012  Left vm to advise only sent #30 and to call in to schedule CPE

## 2011-03-02 ENCOUNTER — Encounter: Payer: Self-pay | Admitting: *Deleted

## 2011-03-02 ENCOUNTER — Ambulatory Visit (INDEPENDENT_AMBULATORY_CARE_PROVIDER_SITE_OTHER): Payer: BC Managed Care – PPO | Admitting: Family Medicine

## 2011-03-02 ENCOUNTER — Encounter: Payer: Self-pay | Admitting: Family Medicine

## 2011-03-02 DIAGNOSIS — E785 Hyperlipidemia, unspecified: Secondary | ICD-10-CM

## 2011-03-02 DIAGNOSIS — Z0000 Encounter for general adult medical examination without abnormal findings: Secondary | ICD-10-CM

## 2011-03-02 LAB — CBC WITH DIFFERENTIAL/PLATELET
Basophils Absolute: 0 10*3/uL (ref 0.0–0.1)
Basophils Relative: 0.5 % (ref 0.0–3.0)
Eosinophils Absolute: 0.2 10*3/uL (ref 0.0–0.7)
Eosinophils Relative: 4.5 % (ref 0.0–5.0)
Hemoglobin: 13.4 g/dL (ref 12.0–15.0)
Lymphocytes Relative: 36.3 % (ref 12.0–46.0)
Lymphs Abs: 1.5 10*3/uL (ref 0.7–4.0)
MCHC: 34.4 g/dL (ref 30.0–36.0)
Monocytes Relative: 5.3 % (ref 3.0–12.0)
Neutro Abs: 2.3 10*3/uL (ref 1.4–7.7)
Platelets: 190 10*3/uL (ref 150.0–400.0)
RBC: 4.1 Mil/uL (ref 3.87–5.11)
WBC: 4.2 10*3/uL — ABNORMAL LOW (ref 4.5–10.5)

## 2011-03-02 LAB — LIPID PANEL
HDL: 80.3 mg/dL (ref >39.00)
LDL Cholesterol: 72 mg/dL (ref 0–99)
Total CHOL/HDL Ratio: 2
VLDL: 9.2 mg/dL (ref 0.0–40.0)

## 2011-03-02 LAB — BASIC METABOLIC PANEL
CO2: 29 mEq/L (ref 19–32)
Calcium: 9.3 mg/dL (ref 8.4–10.5)
Creatinine, Ser: 0.7 mg/dL (ref 0.4–1.2)
Glucose, Bld: 92 mg/dL (ref 70–99)
Potassium: 4.1 mEq/L (ref 3.5–5.1)

## 2011-03-02 LAB — HEPATIC FUNCTION PANEL
AST: 30 U/L (ref 0–37)
Albumin: 4.6 g/dL (ref 3.5–5.2)

## 2011-03-02 NOTE — Patient Instructions (Signed)
Schedule an appt in 6 months to recheck cholesterol We'll notify you of your lab results You look great!  Keep up the good work! Ask Dr Cherly Hensen about the tetanus [facility] set up your bone density and call you Call with any questions or concerns Happy [birth year] Birthday!!!

## 2011-03-02 NOTE — Progress Notes (Signed)
  Subjective:    Patient ID: Dominique Harrell, female    DOB: [birth year], 56 y.o.   MRN: [id number]  HPI CPE- GYN Dr Cherly Hensen.  Mammo at [place].  No concerns about health today.   Review of Systems Patient reports no vision/ hearing changes, adenopathy,fever, weight change,  persistant/recurrent hoarseness , swallowing issues, chest pain, palpitations, edema, persistant/recurrent cough, hemoptysis, dyspnea (rest/exertional/paroxysmal nocturnal), gastrointestinal bleeding (melena, rectal bleeding), abdominal pain, significant heartburn, bowel changes, GU symptoms (dysuria, hematuria, incontinence), Gyn symptoms (abnormal  bleeding, pain),  syncope, focal weakness, memory loss, numbness & tingling, skin/hair/nail changes, abnormal bruising or bleeding, anxiety, or depression.     Objective:   Physical Exam General Appearance:    Alert, cooperative, no distress, appears stated age  Head:    Normocephalic, without obvious abnormality, atraumatic  Eyes:    PERRL, conjunctiva/corneas clear, EOM's intact, fundi    benign, both eyes  Ears:    Normal TM's and external ear canals, both ears  Nose:   Nares normal, septum midline, mucosa normal, no drainage    or sinus tenderness  Throat:   Lips, mucosa, and tongue normal; teeth and gums normal  Neck:   Supple, symmetrical, trachea midline, no adenopathy;    Thyroid: no enlargement/tenderness/nodules  Back:     Symmetric, no curvature, ROM normal, no CVA tenderness  Lungs:     Clear to auscultation bilaterally, respirations unlabored  Chest Wall:    No tenderness or deformity   Heart:    Regular rate and rhythm, S1 and S2 normal, no murmur, rub   or gallop  Breast Exam:    Deferred to GYN  Abdomen:     Soft, non-tender, bowel sounds active all four quadrants,    no masses, no organomegaly  Genitalia:    Deferred to GYN  Rectal:    Extremities:   Extremities normal, atraumatic, no cyanosis or edema  Pulses:   2+ and symmetric all extremities    Skin:   Skin color, texture, turgor normal, no rashes or lesions  Lymph nodes:   Cervical, supraclavicular, and axillary nodes normal  Neurologic:   CNII-XII intact, normal strength, sensation and reflexes    throughout          Assessment & Plan:

## 2011-03-02 NOTE — Assessment & Plan Note (Signed)
Chronic problem.  Tolerating statin w/out difficulty.  Check labs.  Adjust meds prn  

## 2011-03-02 NOTE — Assessment & Plan Note (Signed)
Pt's PE WNL.  UTD on GYN and colonoscopy.  Will refer for DEXA as baseline post-menopausal screen.  Check labs.  Anticipatory guidance provided.

## 2011-03-06 ENCOUNTER — Other Ambulatory Visit: Payer: Self-pay | Admitting: Family Medicine

## 2011-03-06 MED ORDER — SIMVASTATIN 40 MG PO TABS: 40.0000 mg | ORAL_TABLET | Freq: Every day | ORAL | Status: DC

## 2011-03-06 NOTE — Telephone Encounter (Signed)
rx sent to pharmacy by e-script  

## 2011-03-07 ENCOUNTER — Encounter: Payer: Self-pay | Admitting: *Deleted

## 2011-08-10 ENCOUNTER — Telehealth: Payer: Self-pay | Admitting: *Deleted

## 2011-08-10 NOTE — Telephone Encounter (Signed)
.  left message to have patient return my call to clarify which pharmacy to send new medication to

## 2011-08-10 NOTE — Telephone Encounter (Signed)
Discuss with patient will come in for OV to discuss other options as far as meds due to mixed reviews on fosamax.

## 2011-08-10 NOTE — Telephone Encounter (Signed)
Called pt to advise results from recent bone density test noted that she has Osteoporosis and needs to start a prescription medication called Fosamax 75mg  weekly and 1200 units of Calcium with 800 units of Vit D that can be accomplished by taking 2 caltrates daily. Mailed a copy of the letter to the pt with the results and advice about medication changes.

## 2011-08-11 ENCOUNTER — Ambulatory Visit (INDEPENDENT_AMBULATORY_CARE_PROVIDER_SITE_OTHER): Payer: BC Managed Care – PPO | Admitting: Family Medicine

## 2011-08-11 VITALS — BP 118/62 | HR 79 | Temp 98.7°F | Wt 116.0 lb

## 2011-08-11 DIAGNOSIS — M810 Age-related osteoporosis without current pathological fracture: Secondary | ICD-10-CM

## 2011-08-11 NOTE — Patient Instructions (Addendum)
This is very mild!  Don't stress! Start Calcium 1200 and Vit D 800 (2 Caltrate daily) Make sure you continue to exercise!!! You will not break!! Have a great summer!!!

## 2011-08-12 DIAGNOSIS — M810 Age-related osteoporosis without current pathological fracture: Secondary | ICD-10-CM | POA: Insufficient documentation

## 2011-08-12 NOTE — Progress Notes (Signed)
  Subjective:    Patient ID: Dominique Harrell, female    DOB: 02-22-1955, 56 y.o.   MRN: [id number]  HPI Osteoporosis- pt was notified of DEXA results via phone and 'i freaked out.  i'm not old'.  Wants to discuss options other than Fosamax- mother-in-law was on Fosamax and had spontaneous femur fracture.  Not currently taking Ca or Vit D   Review of Systems For ROS see HPI     Objective:   Physical Exam  Vitals reviewed. Constitutional: She appears well-developed and well-nourished. No distress.  Skin: Skin is warm and dry.  Psychiatric: Her behavior is normal. Judgment and thought content normal.       Mildly anxious          Assessment & Plan:

## 2011-08-12 NOTE — Assessment & Plan Note (Signed)
New.  Reviewed dx w/ pt.  Discussed options- bisphosphonates, Reclast, Prolia.  Showed pt graphical representation of where she falls- barely in the osteoporosis category.  Most sites barely qualify as osteopenic but R femoral neck is -2.5.  Pt agreeable to 1200 units Ca and 800 of Vit D daily plus regular exercise.  Will follow.

## 2011-08-21 ENCOUNTER — Encounter: Payer: Self-pay | Admitting: Family Medicine

## 2011-09-20 ENCOUNTER — Telehealth: Payer: Self-pay | Admitting: Family Medicine

## 2011-09-20 MED ORDER — SIMVASTATIN 40 MG PO TABS: 40.0000 mg | ORAL_TABLET | Freq: Every day | ORAL | Status: DC

## 2011-09-20 NOTE — Telephone Encounter (Signed)
Last Lipid check 02/2011, last OV 08/11/11

## 2011-09-20 NOTE — Telephone Encounter (Signed)
Refill: Simvastatin 40mg  tablets. Take one tablet by mouth at bedtime. Qty 30. Last fill 08-21-11

## 2012-03-25 ENCOUNTER — Telehealth: Payer: Self-pay | Admitting: Family Medicine

## 2012-03-25 DIAGNOSIS — E785 Hyperlipidemia, unspecified: Secondary | ICD-10-CM

## 2012-03-25 MED ORDER — SIMVASTATIN 40 MG PO TABS: 40.0000 mg | ORAL_TABLET | Freq: Every day | ORAL | Status: DC

## 2012-03-25 NOTE — Telephone Encounter (Signed)
refill Simvastatin (Tab) 40 MG Take 1 tablet (40 mg total) by mouth at bedtime #90, last fill 11.7.13

## 2012-03-25 NOTE — Telephone Encounter (Signed)
Refill for simvastatin sent to [facility]

## 2012-07-14 LAB — HM MAMMOGRAPHY: HM Mammogram: NORMAL

## 2012-08-28 ENCOUNTER — Encounter: Payer: Self-pay | Admitting: Family Medicine

## 2012-08-28 ENCOUNTER — Ambulatory Visit (INDEPENDENT_AMBULATORY_CARE_PROVIDER_SITE_OTHER): Payer: BC Managed Care – PPO | Admitting: Family Medicine

## 2012-08-28 VITALS — BP 110/78 | HR 65 | Temp 98.2°F | Ht 63.25 in | Wt 118.0 lb

## 2012-08-28 DIAGNOSIS — Z0000 Encounter for general adult medical examination without abnormal findings: Secondary | ICD-10-CM

## 2012-08-28 LAB — CBC WITH DIFFERENTIAL/PLATELET
Eosinophils Relative: 4.5 % (ref 0.0–5.0)
HCT: 39.9 % (ref 36.0–46.0)
Hemoglobin: 13.5 g/dL (ref 12.0–15.0)
Lymphocytes Relative: 33 % (ref 12.0–46.0)
Neutrophils Relative %: 56.8 % (ref 43.0–77.0)
Platelets: 206 10*3/uL (ref 150.0–400.0)
RDW: 12.9 % (ref 11.5–14.6)

## 2012-08-28 LAB — BASIC METABOLIC PANEL
BUN: 13 mg/dL (ref 6–23)
Calcium: 9.5 mg/dL (ref 8.4–10.5)
Creatinine, Ser: 0.7 mg/dL (ref 0.4–1.2)
GFR: 94.62 mL/min (ref >60.00)
Glucose, Bld: 77 mg/dL (ref 70–99)

## 2012-08-28 LAB — LIPID PANEL
Cholesterol: 174 mg/dL (ref 0–200)
LDL Cholesterol: 91 mg/dL (ref 0–99)
Total CHOL/HDL Ratio: 3
Triglycerides: 69 mg/dL (ref 0.0–149.0)
VLDL: 13.8 mg/dL (ref 0.0–40.0)

## 2012-08-28 LAB — HEPATIC FUNCTION PANEL
AST: 24 U/L (ref 0–37)
Alkaline Phosphatase: 48 U/L (ref 39–117)
Bilirubin, Direct: 0.1 mg/dL (ref 0.0–0.3)
Total Bilirubin: 0.6 mg/dL (ref 0.3–1.2)

## 2012-08-28 LAB — TSH: TSH: 1.2 u[IU]/mL (ref 0.35–5.50)

## 2012-08-28 NOTE — Patient Instructions (Addendum)
Follow up in 6 months to recheck cholesterol Keep up the good work!  You look great! We'll notify you of your lab results and make any changes if needed Call with any questions or concerns Enjoy the beach!!!

## 2012-08-28 NOTE — Assessment & Plan Note (Signed)
Pt's PE WNL.  UTD on GYN.  Check labs.  Anticipatory guidance provided.  

## 2012-08-28 NOTE — Progress Notes (Signed)
  Subjective:    Patient ID: Dominique Harrell, female    DOB: January 23, 1956, 57 y.o.   MRN: [id number]  HPI CPE- UTD on colonoscopy.  GYN- Cousins.  UTD on mammo and DEXA.   Review of Systems Patient reports no vision/ hearing changes, adenopathy,fever, weight change,  persistant/recurrent hoarseness , swallowing issues, chest pain, palpitations, edema, persistant/recurrent cough, hemoptysis, dyspnea (rest/exertional/paroxysmal nocturnal), gastrointestinal bleeding (melena, rectal bleeding), abdominal pain, significant heartburn, bowel changes, GU symptoms (dysuria, hematuria, incontinence), Gyn symptoms (abnormal  bleeding, pain),  syncope, focal weakness, memory loss, numbness & tingling, skin/hair/nail changes, abnormal bruising or bleeding, anxiety, or depression.     Objective:   Physical Exam General Appearance:    Alert, cooperative, no distress, appears stated age  Head:    Normocephalic, without obvious abnormality, atraumatic  Eyes:    PERRL, conjunctiva/corneas clear, EOM's intact, fundi    benign, both eyes  Ears:    Normal TM's and external ear canals, both ears  Nose:   Nares normal, septum midline, mucosa normal, no drainage    or sinus tenderness  Throat:   Lips, mucosa, and tongue normal; teeth and gums normal  Neck:   Supple, symmetrical, trachea midline, no adenopathy;    Thyroid: no enlargement/tenderness/nodules  Back:     Symmetric, no curvature, ROM normal, no CVA tenderness  Lungs:     Clear to auscultation bilaterally, respirations unlabored  Chest Wall:    No tenderness or deformity   Heart:    Regular rate and rhythm, S1 and S2 normal, no murmur, rub   or gallop  Breast Exam:    Deferred to GYN  Abdomen:     Soft, non-tender, bowel sounds active all four quadrants,    no masses, no organomegaly  Genitalia:    Deferred to GYN  Rectal:    Extremities:   Extremities normal, atraumatic, no cyanosis or edema  Pulses:   2+ and symmetric all extremities  Skin:    Skin color, texture, turgor normal, no rashes or lesions  Lymph nodes:   Cervical, supraclavicular, and axillary nodes normal  Neurologic:   CNII-XII intact, normal strength, sensation and reflexes    throughout          Assessment & Plan:

## 2012-08-29 ENCOUNTER — Encounter: Payer: Self-pay | Admitting: *Deleted

## 2012-09-03 ENCOUNTER — Encounter: Payer: Self-pay | Admitting: *Deleted

## 2012-09-24 ENCOUNTER — Other Ambulatory Visit: Payer: Self-pay | Admitting: Family Medicine

## 2012-10-09 ENCOUNTER — Encounter: Payer: BC Managed Care – PPO | Admitting: Family Medicine

## 2012-12-28 ENCOUNTER — Other Ambulatory Visit: Payer: Self-pay | Admitting: Family Medicine

## 2012-12-30 NOTE — Telephone Encounter (Signed)
Med filled.  

## 2013-02-12 LAB — HM MAMMOGRAPHY: HM Mammogram: NORMAL

## 2013-02-25 ENCOUNTER — Encounter: Payer: Self-pay | Admitting: Family Medicine

## 2013-03-05 ENCOUNTER — Encounter: Payer: Self-pay | Admitting: Family Medicine

## 2013-03-29 ENCOUNTER — Other Ambulatory Visit: Payer: Self-pay | Admitting: Family Medicine

## 2013-03-29 NOTE — Telephone Encounter (Signed)
Med filled.  

## 2013-04-12 ENCOUNTER — Ambulatory Visit (INDEPENDENT_AMBULATORY_CARE_PROVIDER_SITE_OTHER): Payer: BC Managed Care – PPO | Admitting: Family Medicine

## 2013-04-12 VITALS — BP 108/64 | HR 58 | Temp 97.9°F | Ht 64.0 in | Wt 115.0 lb

## 2013-04-12 DIAGNOSIS — S29012A Strain of muscle and tendon of back wall of thorax, initial encounter: Secondary | ICD-10-CM

## 2013-04-12 DIAGNOSIS — S239XXA Sprain of unspecified parts of thorax, initial encounter: Secondary | ICD-10-CM

## 2013-04-12 MED ORDER — CYCLOBENZAPRINE HCL 10 MG PO TABS: 10.0000 mg | ORAL_TABLET | Freq: Every evening | ORAL | Status: DC | PRN

## 2013-04-12 MED ORDER — MELOXICAM 15 MG PO TABS: 15.0000 mg | ORAL_TABLET | Freq: Every day | ORAL | Status: DC

## 2013-04-12 NOTE — Patient Instructions (Signed)
I think you have a rhomboid muscle strain in your back  Use heat and massage it  Do the stretch we reviewed  Take meloxicam with food as needed for pain and inflammation Try flexeril at night time- to relax muscle (this will sedate you)   Update if not starting to improve in a week or if worsening

## 2013-04-12 NOTE — Assessment & Plan Note (Signed)
Poss from exercise machine and posture No neurol s/s Disc use of heat and taught rhomboid stretch in the office  meloxicam prn with food daily for pain and inflammation Flexeril  for night time use with caution of sedation Update if not starting to improve in a week or if worsening   Consider PT/films if no imp

## 2013-04-12 NOTE — Progress Notes (Signed)
   Subjective:    Patient ID: Dominique Harrell, female    DOB: Sep 01, 1955, 58 y.o.   MRN: [id number]  HPI    Review of Systems     Objective:   Physical Exam        Assessment & Plan:

## 2013-04-12 NOTE — Progress Notes (Signed)
Subjective:    Patient ID: Dominique Harrell, female    DOB: June 05, 1955, 58 y.o.   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 General medical examination 03/02/2011  . Left shoulder pain 06/21/2010  . SHOULDER IMPINGEMENT SYNDROME, LEFT 02/15/2010  . NEOPLASM OF UNCERTAIN BEHAVIOR OF SKIN 12/02/2009  . OTHER&UNSPECIFIED DISEASES THE ORAL SOFT TISSUES 09/06/2009  . ELBOW PAIN, LEFT 07/30/2008  . HYPERLIPIDEMIA 06/08/2006  . DEPRESSION 06/08/2006  . MIGRAINE HEADACHE 06/08/2006  . INSOMNIA 06/08/2006   Past Medical History  Diagnosis Date  . Migraine   . Insomnia   . Hyperlipidemia    Past Surgical History  Procedure Laterality Date  . Oophorectomy  1999    left, dermoid cyst   History  Substance Use Topics  . Smoking status: Never Smoker   . Smokeless tobacco: Not on file  . Alcohol Use: Not on file   Family History  Problem Relation Age of Onset  . Diabetes Mother   . Hyperlipidemia Other   . Hypertension Other   . Parkinsonism Father    Allergies  Allergen Reactions  . Penicillins    Current Outpatient Prescriptions on File Prior to Visit  Medication Sig Dispense Refill  . aspirin 81 MG tablet Take 81 mg by mouth daily.        . Calcium Carbonate-Vit D-Min 600-400 MG-UNIT TABS Take 1 tablet by mouth 2 (two) times daily.       . Omega-3 Fatty Acids (FISH OIL CONCENTRATE PO) Take 1 tablet by mouth daily.        . simvastatin (ZOCOR) 40 MG  tablet TAKE 1 TABLET BY MOUTH EVERY NIGHT AT BEDTIME  90 tablet  0   No current facility-administered medications on file prior to visit.    Review of Systems Review of Systems  Constitutional: Negative for fever, appetite change, fatigue and unexpected weight change.  Eyes: Negative for pain and visual disturbance.  Respiratory: Negative for cough and shortness of breath.   Cardiovascular: Negative for cp or palpitations    Gastrointestinal: Negative for nausea, diarrhea and constipation.  Genitourinary: Negative for urgency and frequency.  Skin: Negative for pallor or rash   MSK pos for back pain  Neurological: Negative for weakness, light-headedness, numbness and headaches.  Hematological: Negative for adenopathy. Does not bruise/bleed easily.  Psychiatric/Behavioral: Negative for dysphoric mood. The patient is not nervous/anxious.         Objective:   Physical Exam  Constitutional: She appears well-developed and well-nourished. No distress.  HENT:  Head: Normocephalic and atraumatic.  Eyes: Conjunctivae and EOM are normal. Pupils are equal, round, and reactive to light.  Neck: Normal range of motion. Neck supple.  Some back pain with full neck flexion No tenderness  Nl rom   Cardiovascular: Normal rate and regular rhythm.   Musculoskeletal: She exhibits  tenderness. She exhibits no edema.  Tender just medial to R scapula (not on spine) Nl rom TS and upper ext  Some pain with rhomboid stretch (neck flex and head tilt) No trap tenderness No neurol deficits   Lymphadenopathy:    She has no cervical adenopathy.  Neurological: She is alert. She has normal reflexes.  Skin: Skin is warm and dry. No rash noted. No erythema.  Psychiatric: She has a normal mood and affect.          Assessment & Plan:

## 2013-04-13 LAB — HM PAP SMEAR: HM PAP: NORMAL

## 2013-04-13 LAB — HM MAMMOGRAPHY: HM MAMMO: NORMAL

## 2013-06-23 ENCOUNTER — Ambulatory Visit (INDEPENDENT_AMBULATORY_CARE_PROVIDER_SITE_OTHER): Payer: BC Managed Care – PPO | Admitting: Family Medicine

## 2013-06-23 ENCOUNTER — Encounter: Payer: Self-pay | Admitting: Family Medicine

## 2013-06-23 VITALS — BP 120/80 | HR 65 | Temp 98.2°F | Resp 16 | Wt 114.1 lb

## 2013-06-23 DIAGNOSIS — J209 Acute bronchitis, unspecified: Secondary | ICD-10-CM

## 2013-06-23 MED ORDER — SIMVASTATIN 40 MG PO TABS: ORAL_TABLET | ORAL | Status: DC

## 2013-06-23 MED ORDER — PROMETHAZINE-DM 6.25-15 MG/5ML PO SYRP: 5.0000 mL | ORAL_SOLUTION | Freq: Four times a day (QID) | ORAL | Status: DC | PRN

## 2013-06-23 NOTE — Progress Notes (Signed)
Pre visit review using our clinic review tool, if applicable. No additional management support is needed unless otherwise documented below in the visit note. 

## 2013-06-23 NOTE — Progress Notes (Signed)
   Subjective:    Patient ID: Dominique Harrell, female    DOB: [birth year], 58 y.o.   MRN: [id number]  Cough Associated symptoms include headaches.  Headache  Associated symptoms include coughing.   URI- sxs started 8 days ago.  Minimal nasal congestion, + sore throat and chest congestion.  R ear pain.  No sinus pain/pressure.  No fevers.  No N/V/D.  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  Cough is minimally productive- yellow sputum.   Review of Systems  Respiratory: Positive for cough.   Neurological: Positive for headaches.   For ROS see HPI     Objective:   Physical Exam  Vitals reviewed. Constitutional: She appears well-developed and well-nourished. No distress.  HENT:  Head: Normocephalic and atraumatic.  TMs normal bilaterally Mild nasal congestion Throat w/out erythema, edema, or exudate  Eyes: Conjunctivae and EOM are normal. Pupils are equal, round, and reactive to light.  Neck: Normal range of motion. Neck supple.  Cardiovascular: Normal rate, regular rhythm, normal heart sounds and intact distal pulses.   No murmur heard. Pulmonary/Chest: Effort normal and breath sounds normal. No respiratory distress. She has no wheezes.  + hacking cough  Lymphadenopathy:    She has no cervical adenopathy.          Assessment & Plan:

## 2013-06-23 NOTE — Assessment & Plan Note (Signed)
New.  Pt's sxs and PE consistent w/ viral/allergy combo.  No evidence of bacterial infxn- no need for abx.  Cough meds prn.  Reviewed supportive care and red flags that should prompt return.  Pt expressed understanding and is in agreement w/ plan.

## 2013-06-23 NOTE — Patient Instructions (Signed)
Follow up as needed Start Claritin or Zyrtec until feeling better Use the cough syrup for nights/weekends- will cause drowsiness Mucinex DM for daytime cough and congestion Drink plenty of fluids REST! Call with any questions or concerns Hang in there!

## 2013-08-04 LAB — HM DEXA SCAN

## 2013-08-12 ENCOUNTER — Encounter: Payer: Self-pay | Admitting: General Practice

## 2013-08-13 ENCOUNTER — Encounter: Payer: Self-pay | Admitting: General Practice

## 2013-08-22 ENCOUNTER — Encounter: Payer: Self-pay | Admitting: Family Medicine

## 2013-08-26 ENCOUNTER — Encounter: Payer: Self-pay | Admitting: General Practice

## 2013-08-26 ENCOUNTER — Telehealth: Payer: Self-pay | Admitting: General Practice

## 2013-08-26 NOTE — Telephone Encounter (Signed)
Letter sent to Pt awaiting decision    Dr. Birdie Riddle would like for you to continue Calcium 1200mg  and Vitamin D 800iu. These are available over the counter either individually or through 2 caltrate supplements.   Also she would like for you to start either fosamax or a yearly reclast infusion treatment. Please advise the office of your choice.

## 2013-09-04 ENCOUNTER — Encounter: Payer: Self-pay | Admitting: Family Medicine

## 2013-09-04 ENCOUNTER — Other Ambulatory Visit: Payer: Self-pay | Admitting: General Practice

## 2013-09-04 ENCOUNTER — Ambulatory Visit (INDEPENDENT_AMBULATORY_CARE_PROVIDER_SITE_OTHER): Payer: BC Managed Care – PPO | Admitting: Family Medicine

## 2013-09-04 VITALS — BP 106/68 | HR 77 | Temp 98.0°F | Resp 16 | Ht 64.0 in | Wt 115.4 lb

## 2013-09-04 DIAGNOSIS — M810 Age-related osteoporosis without current pathological fracture: Secondary | ICD-10-CM

## 2013-09-04 DIAGNOSIS — Z0000 Encounter for general adult medical examination without abnormal findings: Secondary | ICD-10-CM

## 2013-09-04 LAB — CBC WITH DIFFERENTIAL/PLATELET
Basophils Absolute: 0 10*3/uL (ref 0.0–0.1)
Basophils Relative: 0.3 % (ref 0.0–3.0)
EOS PCT: 4 % (ref 0.0–5.0)
Eosinophils Absolute: 0.2 10*3/uL (ref 0.0–0.7)
HCT: 40.7 % (ref 36.0–46.0)
HEMOGLOBIN: 13.8 g/dL (ref 12.0–15.0)
LYMPHS ABS: 1.5 10*3/uL (ref 0.7–4.0)
Lymphocytes Relative: 30.5 % (ref 12.0–46.0)
MCHC: 34 g/dL (ref 30.0–36.0)
MCV: 93.8 fl (ref 78.0–100.0)
MONOS PCT: 5.7 % (ref 3.0–12.0)
Monocytes Absolute: 0.3 10*3/uL (ref 0.1–1.0)
Neutro Abs: 2.8 10*3/uL (ref 1.4–7.7)
Neutrophils Relative %: 59.5 % (ref 43.0–77.0)
Platelets: 222 10*3/uL (ref 150.0–400.0)
RBC: 4.34 Mil/uL (ref 3.87–5.11)
RDW: 13.4 % (ref 11.5–15.5)
WBC: 4.8 10*3/uL (ref 4.0–10.5)

## 2013-09-04 LAB — LIPID PANEL
CHOLESTEROL: 185 mg/dL (ref 0–200)
HDL: 74.6 mg/dL (ref >39.00)
LDL Cholesterol: 91 mg/dL (ref 0–99)
NONHDL: 110.4
Total CHOL/HDL Ratio: 2
Triglycerides: 99 mg/dL (ref 0.0–149.0)
VLDL: 19.8 mg/dL (ref 0.0–40.0)

## 2013-09-04 LAB — TSH: TSH: 1.29 u[IU]/mL (ref 0.35–4.50)

## 2013-09-04 LAB — BASIC METABOLIC PANEL
BUN: 16 mg/dL (ref 6–23)
CALCIUM: 9.6 mg/dL (ref 8.4–10.5)
CO2: 29 meq/L (ref 19–32)
Chloride: 106 mEq/L (ref 96–112)
Creatinine, Ser: 0.6 mg/dL (ref 0.4–1.2)
GFR: 120.44 mL/min (ref >60.00)
GLUCOSE: 85 mg/dL (ref 70–99)
POTASSIUM: 4 meq/L (ref 3.5–5.1)
SODIUM: 141 meq/L (ref 135–145)

## 2013-09-04 LAB — HEPATIC FUNCTION PANEL
ALK PHOS: 47 U/L (ref 39–117)
ALT: 45 U/L — AB (ref 0–35)
AST: 34 U/L (ref 0–37)
Albumin: 4.4 g/dL (ref 3.5–5.2)
BILIRUBIN DIRECT: 0.1 mg/dL (ref 0.0–0.3)
BILIRUBIN TOTAL: 1 mg/dL (ref 0.2–1.2)
Total Protein: 7.1 g/dL (ref 6.0–8.3)

## 2013-09-04 LAB — VITAMIN D 25 HYDROXY (VIT D DEFICIENCY, FRACTURES): VITD: 41.33 ng/mL (ref 30.00–100.00)

## 2013-09-04 MED ORDER — SIMVASTATIN 40 MG PO TABS: ORAL_TABLET | ORAL | Status: DC

## 2013-09-04 NOTE — Progress Notes (Signed)
   Subjective:    Patient ID: Dominique Harrell, female    DOB: Apr 17, 1955, 58 y.o.   MRN: [id number]  HPI CPE- UTD on mammo, DEXA, pap, colonoscopy.   Review of Systems Patient reports no vision/ hearing changes, adenopathy,fever, weight change,  persistant/recurrent hoarseness , swallowing issues, chest pain, palpitations, edema, persistant/recurrent cough, hemoptysis, dyspnea (rest/exertional/paroxysmal nocturnal), gastrointestinal bleeding (melena, rectal bleeding), abdominal pain, significant heartburn, bowel changes, GU symptoms (dysuria, hematuria, incontinence), Gyn symptoms (abnormal  bleeding, pain),  syncope, focal weakness, memory loss, numbness & tingling, skin/hair/nail changes, abnormal bruising or bleeding, anxiety, or depression.     Objective:   Physical Exam General Appearance:    Alert, cooperative, no distress, appears stated age  Head:    Normocephalic, without obvious abnormality, atraumatic  Eyes:    PERRL, conjunctiva/corneas clear, EOM's intact, fundi    benign, both eyes  Ears:    Normal TM's and external ear canals, both ears  Nose:   Nares normal, septum midline, mucosa normal, no drainage    or sinus tenderness  Throat:   Lips, mucosa, and tongue normal; teeth and gums normal  Neck:   Supple, symmetrical, trachea midline, no adenopathy;    Thyroid: no enlargement/tenderness/nodules  Back:     Symmetric, no curvature, ROM normal, no CVA tenderness  Lungs:     Clear to auscultation bilaterally, respirations unlabored  Chest Wall:    No tenderness or deformity   Heart:    Regular rate and rhythm, S1 and S2 normal, no murmur, rub   or gallop  Breast Exam:    Deferred to GYN  Abdomen:     Soft, non-tender, bowel sounds active all four quadrants,    no masses, no organomegaly  Genitalia:    Deferred to GYN  Rectal:    Extremities:   Extremities normal, atraumatic, no cyanosis or edema  Pulses:   2+ and symmetric all extremities  Skin:   Skin color, texture,  turgor normal, no rashes or lesions  Lymph nodes:   Cervical, supraclavicular, and axillary nodes normal  Neurologic:   CNII-XII intact, normal strength, sensation and reflexes    throughout          Assessment & Plan:

## 2013-09-04 NOTE — Assessment & Plan Note (Signed)
Chronic problem.  Pt's T score has decreased from -2.5 --> -2.9  Based on this and our discussion, pt willing to start weekly Fosamax assuming all her labs are normal today.

## 2013-09-04 NOTE — Progress Notes (Signed)
Pre visit review using our clinic review tool, if applicable. No additional management support is needed unless otherwise documented below in the visit note. 

## 2013-09-04 NOTE — Assessment & Plan Note (Signed)
Pt's PE WNL.  Pt UTD on colonoscopy, mammo, pap, DEXA.  Check labs.  Anticipatory guidance provided.

## 2013-09-04 NOTE — Patient Instructions (Signed)
Follow up in 6 months to recheck cholesterol We'll notify you of your lab results and make any changes if needed If labs look good, we'll consider moving forward w/ the fosamax Keep up the good work!  You look great! Enjoy the rest of your summer!!!

## 2013-09-08 ENCOUNTER — Encounter: Payer: BC Managed Care – PPO | Admitting: Family Medicine

## 2013-09-19 ENCOUNTER — Other Ambulatory Visit: Payer: Self-pay | Admitting: General Practice

## 2013-09-19 ENCOUNTER — Ambulatory Visit: Payer: BC Managed Care – PPO | Admitting: Family Medicine

## 2013-09-19 ENCOUNTER — Other Ambulatory Visit: Payer: Self-pay | Admitting: Family Medicine

## 2013-09-19 ENCOUNTER — Encounter: Payer: Self-pay | Admitting: General Practice

## 2013-09-19 MED ORDER — ALENDRONATE SODIUM 70 MG PO TABS: 70.0000 mg | ORAL_TABLET | ORAL | Status: DC

## 2013-12-15 ENCOUNTER — Encounter: Payer: Self-pay | Admitting: Family Medicine

## 2014-03-28 ENCOUNTER — Other Ambulatory Visit: Payer: Self-pay | Admitting: Family Medicine

## 2014-03-30 NOTE — Telephone Encounter (Signed)
Med filled and letter mailed to pt to make appt/

## 2014-04-16 ENCOUNTER — Encounter: Payer: Self-pay | Admitting: Family Medicine

## 2014-04-16 ENCOUNTER — Ambulatory Visit (INDEPENDENT_AMBULATORY_CARE_PROVIDER_SITE_OTHER): Payer: BC Managed Care – PPO | Admitting: Family Medicine

## 2014-04-16 VITALS — BP 114/70 | HR 61 | Temp 97.9°F | Resp 16 | Wt 117.2 lb

## 2014-04-16 DIAGNOSIS — E785 Hyperlipidemia, unspecified: Secondary | ICD-10-CM

## 2014-04-16 NOTE — Progress Notes (Signed)
Pre visit review using our clinic review tool, if applicable. No additional management support is needed unless otherwise documented below in the visit note. 

## 2014-04-16 NOTE — Progress Notes (Signed)
   Subjective:    Patient ID: Dominique Harrell, female    DOB: 14-Aug-1955, 59 y.o.   MRN: [id number]  HPI Hyperlipidemia- chronic problem, on Simvastatin 40mg .  No abd pain, N/V, CP, SOB, HAs, visual changes, edema.  Pt is walking but has not been going to gym.   Review of Systems For ROS see HPI     Objective:   Physical Exam  Constitutional: She is oriented to person, place, and time. She appears well-developed and well-nourished. No distress.  HENT:  Head: Normocephalic and atraumatic.  Eyes: Conjunctivae and EOM are normal. Pupils are equal, round, and reactive to light.  Neck: Normal range of motion. Neck supple. No thyromegaly present.  Cardiovascular: Normal rate, regular rhythm, normal heart sounds and intact distal pulses.   No murmur heard. Pulmonary/Chest: Effort normal and breath sounds normal. No respiratory distress.  Abdominal: Soft. She exhibits no distension. There is no tenderness.  Musculoskeletal: She exhibits no edema.  Lymphadenopathy:    She has no cervical adenopathy.  Neurological: She is alert and oriented to person, place, and time.  Skin: Skin is warm and dry.  Psychiatric: She has a normal mood and affect. Her behavior is normal.  Vitals reviewed.         Assessment & Plan:

## 2014-04-16 NOTE — Patient Instructions (Signed)
Schedule your complete physical in 6 months We'll notify you of your lab results and make any changes if needed Keep up the good work!  You look great! Call with any questions or concerns Happy Spring!!! 

## 2014-04-16 NOTE — Assessment & Plan Note (Signed)
Chronic problem.  Tolerating statin w/o difficulty.  Check labs.  Adjust meds prn  

## 2014-04-17 LAB — HEPATIC FUNCTION PANEL
ALK PHOS: 37 U/L — AB (ref 39–117)
ALT: 20 U/L (ref 0–35)
AST: 20 U/L (ref 0–37)
Albumin: 4.7 g/dL (ref 3.5–5.2)
BILIRUBIN DIRECT: 0.1 mg/dL (ref 0.0–0.3)
BILIRUBIN TOTAL: 0.7 mg/dL (ref 0.2–1.2)
TOTAL PROTEIN: 7.1 g/dL (ref 6.0–8.3)

## 2014-04-17 LAB — LIPID PANEL
CHOL/HDL RATIO: 2
Cholesterol: 161 mg/dL (ref 0–200)
HDL: 69.1 mg/dL (ref >39.00)
LDL CALC: 79 mg/dL (ref 0–99)
NonHDL: 91.9
TRIGLYCERIDES: 65 mg/dL (ref 0.0–149.0)
VLDL: 13 mg/dL (ref 0.0–40.0)

## 2014-04-17 LAB — BASIC METABOLIC PANEL
BUN: 12 mg/dL (ref 6–23)
CHLORIDE: 105 meq/L (ref 96–112)
CO2: 31 mEq/L (ref 19–32)
CREATININE: 0.71 mg/dL (ref 0.40–1.20)
Calcium: 9.3 mg/dL (ref 8.4–10.5)
GFR: 89.51 mL/min (ref >60.00)
Glucose, Bld: 75 mg/dL (ref 70–99)
Potassium: 4 mEq/L (ref 3.5–5.1)
Sodium: 140 mEq/L (ref 135–145)

## 2014-04-21 ENCOUNTER — Telehealth: Payer: Self-pay | Admitting: *Deleted

## 2014-04-21 NOTE — Telephone Encounter (Signed)
Signed medical release request received via fax from [email or web address]. Forwarded to [employer]. JG//CMA

## 2014-04-25 LAB — HM MAMMOGRAPHY

## 2014-06-15 ENCOUNTER — Other Ambulatory Visit: Payer: Self-pay | Admitting: Family Medicine

## 2014-06-16 NOTE — Telephone Encounter (Signed)
Med filled.  

## 2014-08-05 ENCOUNTER — Other Ambulatory Visit: Payer: Self-pay | Admitting: Family Medicine

## 2014-08-05 NOTE — Telephone Encounter (Signed)
Med filled.  

## 2014-08-18 ENCOUNTER — Encounter: Payer: Self-pay | Admitting: Gastroenterology

## 2014-08-31 ENCOUNTER — Encounter: Payer: Self-pay | Admitting: Medical

## 2014-08-31 ENCOUNTER — Ambulatory Visit (INDEPENDENT_AMBULATORY_CARE_PROVIDER_SITE_OTHER): Payer: BC Managed Care – PPO | Admitting: Medical

## 2014-08-31 VITALS — BP 119/70 | HR 77 | Temp 98.6°F | Ht 64.0 in | Wt 122.4 lb

## 2014-08-31 DIAGNOSIS — J0100 Acute maxillary sinusitis, unspecified: Secondary | ICD-10-CM | POA: Insufficient documentation

## 2014-08-31 MED ORDER — FLUTICASONE PROPIONATE 50 MCG/ACT NA SUSP: 2.0000 | Freq: Every day | NASAL | Status: DC

## 2014-08-31 MED ORDER — AZITHROMYCIN 250 MG PO TABS: ORAL_TABLET | ORAL | Status: DC

## 2014-08-31 NOTE — Progress Notes (Signed)
Subjective:    Patient ID: Dominique Harrell, female    DOB: 1955-07-23, 59 y.o.   MRN: [id number]  HPI   Pt in with sinus pain/pressure on left side. Upper teeth sensitivity and congestion. Pt treated antihistamine and ibuprofen.  Pt states no preceding obvious allergy symptom or cold.  Started 1 wk ago. One day preceding sniffling and mild runny nose.  No fever. No chill. No sweats.  Hx of occasional sinus infection  Review of Systems  Constitutional: Negative for fever, chills and fatigue.  HENT: Positive for congestion, postnasal drip, rhinorrhea and sinus pressure. Negative for sore throat.   Respiratory: Negative for cough, chest tightness, shortness of breath and wheezing.   Cardiovascular: Negative for chest pain and palpitations.  Musculoskeletal: Negative for back pain.  Neurological: Negative for dizziness, seizures, weakness and headaches.  Hematological: Negative for adenopathy. Does not bruise/bleed easily.  Psychiatric/Behavioral: Negative for behavioral problems and confusion. The patient is not nervous/anxious.     Past Medical History  Diagnosis Date  . Migraine   . Insomnia   . Hyperlipidemia     History   Social History  . Marital Status: Married    Spouse Name: N/A  . Number of Children: N/A  . Years of Education: N/A   Occupational History  . Not on file.   Social History Main Topics  . Smoking status: Never Smoker   . Smokeless tobacco: Not on file  . Alcohol Use: Not on file  . Drug Use: Not on file  . Sexual Activity: Not on file   Other Topics Concern  . 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    Past Surgical History  Procedure Laterality Date  . Oophorectomy  1999    left, dermoid cyst    Family History  Problem Relation Age of Onset  . Diabetes Mother   . Hyperlipidemia Other   . Hypertension Other   . Parkinsonism Father     Allergies  Allergen Reactions  . Penicillins      Current Outpatient Prescriptions on File Prior to Visit  Medication Sig Dispense Refill  . alendronate (FOSAMAX) 70 MG tablet TAKE 1 TABLET BY MOUTH EVERY 7 DAYS WITH FULL GLASS OF WATER ON AN EMPTY STOMACH 12 tablet 2  . aspirin 81 MG tablet Take 81 mg by mouth daily.      . Calcium Carbonate-Vit D-Min 600-400 MG-UNIT TABS Take 1 tablet by mouth 2 (two) times daily.     . Omega-3 Fatty Acids (FISH OIL CONCENTRATE PO) Take 1 tablet by mouth daily.      . simvastatin (ZOCOR) 40 MG tablet TAKE 1 TABLET BY MOUTH EVERY NIGHT AT BEDTIME 90 tablet 1   No current facility-administered medications on file prior to visit.    BP 119/70 mmHg  Pulse 77  Temp(Src) 98.6 F (37 C) (Oral)  Ht 5\' 4"  (1.626 m)  Wt 122 lb 6.4 oz (55.52 kg)  BMI 21.00 kg/m2  SpO2 100%       Objective:   Physical Exam  General  Mental Status - Alert. General Appearance - Well groomed. Not in acute distress.  Skin Rashes- No Rashes.  HEENT Head- Normal. Ear Auditory Canal - Left- Normal. Right - Normal.Tympanic Membrane- Left- Normal. Right- Normal. Eye Sclera/Conjunctiva- Left- Normal. Right- Normal. Nose & Sinuses Nasal Mucosa- Left-  Boggy and Congested. Right-  Boggy and  Congested.Bilateral  Lt maxillary and lt frontal sinus pressure.  Mouth & Throat Lips: Upper Lip- Normal: no dryness, cracking, pallor, cyanosis, or vesicular eruption. Lower Lip-Normal: no dryness, cracking, pallor, cyanosis or vesicular eruption. Buccal Mucosa- Bilateral- No Aphthous ulcers. Oropharynx- No Discharge or Erythema. +pnd. Tonsils: Characteristics- Bilateral- No Erythema or Congestion. Size/Enlargement- Bilateral- No enlargement. Discharge- bilateral-None.  Neck Neck- Supple. No Masses.   Chest and Lung Exam Auscultation: Breath Sounds:-Clear even and unlabored.  Cardiovascular Auscultation:Rythm- Regular, rate and rhythm. Murmurs & Other Heart Sounds:Ausculatation of the heart reveal- No  Murmurs.  Lymphatic Head & Neck General Head & Neck Lymphatics: Bilateral: Description- No Localized lymphadenopathy.       Assessment & Plan:

## 2014-08-31 NOTE — Assessment & Plan Note (Addendum)
Your appear to have a sinus infection(possible some recent allergies). I am prescribing azithromycin antibiotic for the infection. To help with the nasal congestion I prescribed flonase  nasal steroid.   Rest, hydrate, tylenol for fever.  Follow up in 7 days or as needed.

## 2014-08-31 NOTE — Patient Instructions (Signed)
Sinusitis, acute maxillary Your appear to have a sinus infection(possible some recent allergies). I am prescribing azithromycin antibiotic for the infection. To help with the nasal congestion I prescribed flonase  nasal steroid.   Rest, hydrate, tylenol for fever.  Follow up in 7 days or as needed.

## 2014-08-31 NOTE — Progress Notes (Signed)
Pre visit review using our clinic review tool, if applicable. No additional management support is needed unless otherwise documented below in the visit note. 

## 2014-10-04 ENCOUNTER — Other Ambulatory Visit: Payer: Self-pay | Admitting: Family Medicine

## 2014-10-05 NOTE — Telephone Encounter (Signed)
Medication filled to pharmacy as requested.   

## 2014-11-02 ENCOUNTER — Encounter: Payer: Self-pay | Admitting: Family Medicine

## 2014-11-02 ENCOUNTER — Ambulatory Visit (INDEPENDENT_AMBULATORY_CARE_PROVIDER_SITE_OTHER): Payer: BC Managed Care – PPO | Admitting: Family Medicine

## 2014-11-02 VITALS — BP 114/72 | HR 59 | Temp 98.0°F | Resp 16 | Ht 64.0 in | Wt 121.0 lb

## 2014-11-02 DIAGNOSIS — Z23 Encounter for immunization: Secondary | ICD-10-CM

## 2014-11-02 DIAGNOSIS — Z0000 Encounter for general adult medical examination without abnormal findings: Secondary | ICD-10-CM

## 2014-11-02 LAB — BASIC METABOLIC PANEL
BUN: 12 mg/dL (ref 6–23)
CO2: 30 mEq/L (ref 19–32)
CREATININE: 0.74 mg/dL (ref 0.40–1.20)
Calcium: 9.5 mg/dL (ref 8.4–10.5)
Chloride: 107 mEq/L (ref 96–112)
GFR: 85.18 mL/min (ref >60.00)
Glucose, Bld: 84 mg/dL (ref 70–99)
Potassium: 4.5 mEq/L (ref 3.5–5.1)
Sodium: 144 mEq/L (ref 135–145)

## 2014-11-02 LAB — CBC WITH DIFFERENTIAL/PLATELET
BASOS ABS: 0 10*3/uL (ref 0.0–0.1)
Basophils Relative: 0.5 % (ref 0.0–3.0)
Eosinophils Absolute: 0.2 10*3/uL (ref 0.0–0.7)
Eosinophils Relative: 3.8 % (ref 0.0–5.0)
HCT: 39.8 % (ref 36.0–46.0)
HEMOGLOBIN: 13.5 g/dL (ref 12.0–15.0)
LYMPHS ABS: 1.7 10*3/uL (ref 0.7–4.0)
Lymphocytes Relative: 36.2 % (ref 12.0–46.0)
MCHC: 33.8 g/dL (ref 30.0–36.0)
MCV: 93.7 fl (ref 78.0–100.0)
MONO ABS: 0.2 10*3/uL (ref 0.1–1.0)
MONOS PCT: 5.4 % (ref 3.0–12.0)
NEUTROS PCT: 54.1 % (ref 43.0–77.0)
Neutro Abs: 2.5 10*3/uL (ref 1.4–7.7)
Platelets: 179 10*3/uL (ref 150.0–400.0)
RBC: 4.25 Mil/uL (ref 3.87–5.11)
RDW: 13.5 % (ref 11.5–15.5)
WBC: 4.6 10*3/uL (ref 4.0–10.5)

## 2014-11-02 LAB — LIPID PANEL
CHOL/HDL RATIO: 2
CHOLESTEROL: 154 mg/dL (ref 0–200)
HDL: 64.1 mg/dL (ref >39.00)
LDL CALC: 72 mg/dL (ref 0–99)
NonHDL: 89.48
TRIGLYCERIDES: 88 mg/dL (ref 0.0–149.0)
VLDL: 17.6 mg/dL (ref 0.0–40.0)

## 2014-11-02 LAB — HEPATIC FUNCTION PANEL
ALBUMIN: 4.4 g/dL (ref 3.5–5.2)
ALT: 21 U/L (ref 0–35)
AST: 21 U/L (ref 0–37)
Alkaline Phosphatase: 32 U/L — ABNORMAL LOW (ref 39–117)
Bilirubin, Direct: 0.1 mg/dL (ref 0.0–0.3)
Total Bilirubin: 0.7 mg/dL (ref 0.2–1.2)
Total Protein: 7 g/dL (ref 6.0–8.3)

## 2014-11-02 LAB — VITAMIN D 25 HYDROXY (VIT D DEFICIENCY, FRACTURES): VITD: 43.37 ng/mL (ref 30.00–100.00)

## 2014-11-02 LAB — TSH: TSH: 1.23 u[IU]/mL (ref 0.35–4.50)

## 2014-11-02 MED ORDER — ALENDRONATE SODIUM 70 MG PO TABS: 70.0000 mg | ORAL_TABLET | ORAL | Status: DC

## 2014-11-02 MED ORDER — SIMVASTATIN 40 MG PO TABS: 40.0000 mg | ORAL_TABLET | Freq: Every day | ORAL | Status: DC

## 2014-11-02 NOTE — Progress Notes (Signed)
Pre visit review using our clinic review tool, if applicable. No additional management support is needed unless otherwise documented below in the visit note. 

## 2014-11-02 NOTE — Patient Instructions (Signed)
Follow up in 6 months to recheck cholesterol We'll notify you of your lab results and make any changes if needed Keep up the good work on healthy diet and regular exercise- you look great! Call with any questions or concerns If you want to join [place] at the new [place] office, any scheduled appointments will automatically transfer and we will see you at [address] [place] [address], [place] [address]  Have a great fall!!!

## 2014-11-02 NOTE — Progress Notes (Signed)
   Subjective:    Patient ID: Dominique Harrell, female    DOB: December 18, 1955, 59 y.o.   MRN: [id number]  HPI CPE- UTD on GYN (Cousins), colonoscopy (due 2019)   Review of Systems Patient reports no vision/ hearing changes, adenopathy,fever, weight change,  persistant/recurrent hoarseness , swallowing issues, chest pain, palpitations, edema, persistant/recurrent cough, hemoptysis, dyspnea (rest/exertional/paroxysmal nocturnal), gastrointestinal bleeding (melena, rectal bleeding), abdominal pain, significant heartburn, bowel changes, GU symptoms (dysuria, hematuria, incontinence), Gyn symptoms (abnormal  bleeding, pain),  syncope, focal weakness, memory loss, numbness & tingling, skin/hair/nail changes, abnormal bruising or bleeding, anxiety, or depression.     Objective:   Physical Exam General Appearance:    Alert, cooperative, no distress, appears stated age  Head:    Normocephalic, without obvious abnormality, atraumatic  Eyes:    PERRL, conjunctiva/corneas clear, EOM's intact, fundi    benign, both eyes  Ears:    Normal TM's and external ear canals, both ears  Nose:   Nares normal, septum midline, mucosa normal, no drainage    or sinus tenderness  Throat:   Lips, mucosa, and tongue normal; teeth and gums normal  Neck:   Supple, symmetrical, trachea midline, no adenopathy;    Thyroid: no enlargement/tenderness/nodules  Back:     Symmetric, no curvature, ROM normal, no CVA tenderness  Lungs:     Clear to auscultation bilaterally, respirations unlabored  Chest Wall:    No tenderness or deformity   Heart:    Regular rate and rhythm, S1 and S2 normal, no murmur, rub   or gallop  Breast Exam:    Deferred to GYN  Abdomen:     Soft, non-tender, bowel sounds active all four quadrants,    no masses, no organomegaly  Genitalia:    Deferred to GYN  Rectal:    Extremities:   Extremities normal, atraumatic, no cyanosis or edema  Pulses:   2+ and symmetric all extremities  Skin:   Skin color,  texture, turgor normal, no rashes or lesions  Lymph nodes:   Cervical, supraclavicular, and axillary nodes normal  Neurologic:   CNII-XII intact, normal strength, sensation and reflexes    throughout          Assessment & Plan:

## 2014-11-02 NOTE — Assessment & Plan Note (Signed)
Pt's PE WNL.  UTD on colonoscopy, GYN.  Check labs.  Flu shot given.  Anticipatory guidance provided.

## 2015-01-02 ENCOUNTER — Other Ambulatory Visit: Payer: Self-pay | Admitting: Family Medicine

## 2015-01-04 NOTE — Telephone Encounter (Signed)
Medication filled to pharmacy as requested.   

## 2015-03-08 LAB — HM MAMMOGRAPHY

## 2015-04-07 ENCOUNTER — Encounter: Payer: Self-pay | Admitting: Physician Assistant

## 2015-04-07 ENCOUNTER — Ambulatory Visit (INDEPENDENT_AMBULATORY_CARE_PROVIDER_SITE_OTHER): Payer: BC Managed Care – PPO | Admitting: Physician Assistant

## 2015-04-07 VITALS — BP 104/63 | HR 66 | Temp 98.0°F | Ht 64.0 in | Wt 120.4 lb

## 2015-04-07 DIAGNOSIS — M5416 Radiculopathy, lumbar region: Secondary | ICD-10-CM | POA: Insufficient documentation

## 2015-04-07 DIAGNOSIS — M5442 Lumbago with sciatica, left side: Secondary | ICD-10-CM | POA: Diagnosis not present

## 2015-04-07 DIAGNOSIS — M5441 Lumbago with sciatica, right side: Secondary | ICD-10-CM | POA: Diagnosis not present

## 2015-04-07 MED ORDER — METHYLPREDNISOLONE ACETATE 80 MG/ML IJ SUSP
80.0000 mg | Freq: Once | INTRAMUSCULAR | Status: AC
  Administered 2015-04-07: 80 mg via INTRAMUSCULAR

## 2015-04-07 MED ORDER — TRAMADOL HCL 50 MG PO TABS: 50.0000 mg | ORAL_TABLET | Freq: Two times a day (BID) | ORAL | Status: DC | PRN

## 2015-04-07 MED ORDER — CYCLOBENZAPRINE HCL 10 MG PO TABS: 10.0000 mg | ORAL_TABLET | Freq: Every day | ORAL | Status: DC

## 2015-04-07 NOTE — Assessment & Plan Note (Signed)
IM Depomedrol given. Rx Tramadol. Rx Flexeril to use at bedtime. Handout on stretches for sciatica given to patient. Follow-up if symptoms are not resolving. She is to take the rest of the week off to relax and feel better.

## 2015-04-07 NOTE — Progress Notes (Signed)
    Patient presents to clinic today c/o low back pain radiating into bilateral lower extremities since Sunday morning. Patient states symptoms started after she reached down to pick up a sponge off of the floor. Denies trauma or injury. Patient denies saddle anesthesia or change in bowel or bladder habits. Denies history of trauma to the back. Has been taking ibuprofen with little relief of symptoms.  Past Medical History  Diagnosis Date  . Migraine   . Insomnia   . Hyperlipidemia     Current Outpatient Prescriptions on File Prior to Visit  Medication Sig Dispense Refill  . alendronate (FOSAMAX) 70 MG tablet Take 1 tablet (70 mg total) by mouth once a week. Take with a full glass of water on an empty stomach. 12 tablet 1  . aspirin 81 MG tablet Take 81 mg by mouth daily.      . Calcium Carbonate-Vit D-Min 600-400 MG-UNIT TABS Take 1 tablet by mouth 2 (two) times daily.     . Omega-3 Fatty Acids (FISH OIL CONCENTRATE PO) Take 1 tablet by mouth daily.      . simvastatin (ZOCOR) 40 MG tablet TAKE 1 TABLET BY MOUTH EVERY NIGHT AT BEDTIME 90 tablet 1   No current facility-administered medications on file prior to visit.    Allergies  Allergen Reactions  . Penicillins     Family History  Problem Relation Age of Onset  . Diabetes Mother   . Hyperlipidemia Other   . Hypertension Other   . Parkinsonism Father     Social History   Social History  . Marital Status: Married    Spouse Name: N/A  . Number of Children: N/A  . Years of Education: N/A   Social History Main Topics  . Smoking status: Never Smoker   . Smokeless tobacco: None  . Alcohol Use: None  . Drug Use: None  . Sexual Activity: Not Asked   Other Topics Concern  . 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   Review of Systems - See HPI.  All other ROS are negative.  BP 104/63 mmHg  Pulse 66  Temp(Src) 98 F (36.7 C) (Oral)  Ht 5\' 4"  (1.626 m)  Wt 120 lb 6.4 oz (54.613 kg)  BMI  20.66 kg/m2  SpO2 100%  Physical Exam  Constitutional: She is oriented to person, place, and time and well-developed, well-nourished, and in no distress.  HENT:  Head: Normocephalic and atraumatic.  Cardiovascular: Normal rate and regular rhythm.   Pulmonary/Chest: Effort normal.  Musculoskeletal:       Thoracic back: Normal.       Lumbar back: She exhibits pain and spasm. She exhibits no tenderness and no bony tenderness.  Neurological: She is alert and oriented to person, place, and time. She has normal sensation.  Skin: Skin is warm and dry. No rash noted.  Psychiatric: Affect normal.  Vitals reviewed.  No results found for this or any previous visit (from the past 2160 hour(s)).  Assessment/Plan: Low back pain with bilateral sciatica IM Depomedrol given. Rx Tramadol. Rx Flexeril to use at bedtime. Handout on stretches for sciatica given to patient. Follow-up if symptoms are not resolving. She is to take the rest of the week off to relax and feel better.

## 2015-04-07 NOTE — Progress Notes (Signed)
Pre visit review using our clinic review tool, if applicable. No additional management support is needed unless otherwise documented below in the visit note. 

## 2015-04-07 NOTE — Addendum Note (Signed)
Addended by: Harl Bowie on: 04/07/2015 08:57 AM   Modules accepted: Orders

## 2015-04-07 NOTE — Patient Instructions (Signed)
The shot given today will start to calm down symptoms. Take the tramadol and Flexeril as directed. Apply heating pad to the lower back for 10 minutes. Do this 2-3 times per day. No heavy lifting or over exertion.  Follow-up if symptoms are not improving within 48 hours. Otherwise follow-up in one week.  Sciatica With Rehab The sciatic nerve runs from the back down the leg and is responsible for sensation and control of the muscles in the back (posterior) side of the thigh, lower leg, and foot. Sciatica is a condition that is characterized by inflammation of this nerve.  SYMPTOMS   Signs of nerve damage, including numbness and/or weakness along the posterior side of the lower extremity.  Pain in the back of the thigh that may also travel down the leg.  Pain that worsens when sitting for long periods of time.  Occasionally, pain in the back or buttock. CAUSES  Inflammation of the sciatic nerve is the cause of sciatica. The inflammation is due to something irritating the nerve. Common sources of irritation include:  Sitting for long periods of time.  Direct trauma to the nerve.  Arthritis of the spine.  Herniated or ruptured disk.  Slipping of the vertebrae (spondylolisthesis).  Pressure from soft tissues, such as muscles or ligament-like tissue (fascia). RISK INCREASES WITH:  Sports that place pressure or stress on the spine (football or weightlifting).  Poor strength and flexibility.  Failure to warm up properly before activity.  Family history of low back pain or disk disorders.  Previous back injury or surgery.  Poor body mechanics, especially when lifting, or poor posture. PREVENTION   Warm up and stretch properly before activity.  Maintain physical fitness:  Strength, flexibility, and endurance.  Cardiovascular fitness.  Learn and use proper technique, especially with posture and lifting. When possible, have coach correct improper technique.  Avoid  activities that place stress on the spine. PROGNOSIS If treated properly, then sciatica usually resolves within 6 weeks. However, occasionally surgery is necessary.  RELATED COMPLICATIONS   Permanent nerve damage, including pain, numbness, tingle, or weakness.  Chronic back pain.  Risks of surgery: infection, bleeding, nerve damage, or damage to surrounding tissues. TREATMENT Treatment initially involves resting from any activities that aggravate your symptoms. The use of ice and medication may help reduce pain and inflammation. The use of strengthening and stretching exercises may help reduce pain with activity. These exercises may be performed at home or with referral to a therapist. A therapist may recommend further treatments, such as transcutaneous electronic nerve stimulation (TENS) or ultrasound. Your caregiver may recommend corticosteroid injections to help reduce inflammation of the sciatic nerve. If symptoms persist despite non-surgical (conservative) treatment, then surgery may be recommended. MEDICATION  If pain medication is necessary, then nonsteroidal anti-inflammatory medications, such as aspirin and ibuprofen, or other minor pain relievers, such as acetaminophen, are often recommended.  Do not take pain medication for 7 days before surgery.  Prescription pain relievers may be given if deemed necessary by your caregiver. Use only as directed and only as much as you need.  Ointments applied to the skin may be helpful.  Corticosteroid injections may be given by your caregiver. These injections should be reserved for the most serious cases, because they may only be given a certain number of times. HEAT AND COLD  Cold treatment (icing) relieves pain and reduces inflammation. Cold treatment should be applied for 10 to 15 minutes every 2 to 3 hours for inflammation and pain and immediately  after any activity that aggravates your symptoms. Use ice packs or massage the area with a  piece of ice (ice massage).  Heat treatment may be used prior to performing the stretching and strengthening activities prescribed by your caregiver, physical therapist, or athletic trainer. Use a heat pack or soak the injury in warm water. SEEK MEDICAL CARE IF:  Treatment seems to offer no benefit, or the condition worsens.  Any medications produce adverse side effects. EXERCISES  RANGE OF MOTION (ROM) AND STRETCHING EXERCISES - Sciatica Most people with sciatic will find that their symptoms worsen with either excessive bending forward (flexion) or arching at the low back (extension). The exercises which will help resolve your symptoms will focus on the opposite motion. Your physician, physical therapist or athletic trainer will help you determine which exercises will be most helpful to resolve your low back pain. Do not complete any exercises without first consulting with your clinician. Discontinue any exercises which worsen your symptoms until you speak to your clinician. If you have pain, numbness or tingling which travels down into your buttocks, leg or foot, the goal of the therapy is for these symptoms to move closer to your back and eventually resolve. Occasionally, these leg symptoms will get better, but your low back pain may worsen; this is typically an indication of progress in your rehabilitation. Be certain to be very alert to any changes in your symptoms and the activities in which you participated in the 24 hours prior to the change. Sharing this information with your clinician will allow him/her to most efficiently treat your condition. These exercises may help you when beginning to rehabilitate your injury. Your symptoms may resolve with or without further involvement from your physician, physical therapist or athletic trainer. While completing these exercises, remember:   Restoring tissue flexibility helps normal motion to return to the joints. This allows healthier, less painful  movement and activity.  An effective stretch should be held for at least 30 seconds.  A stretch should never be painful. You should only feel a gentle lengthening or release in the stretched tissue. FLEXION RANGE OF MOTION AND STRETCHING EXERCISES: STRETCH - Flexion, Single Knee to Chest   Lie on a firm bed or floor with both legs extended in front of you.  Keeping one leg in contact with the floor, bring your opposite knee to your chest. Hold your leg in place by either grabbing behind your thigh or at your knee.  Pull until you feel a gentle stretch in your low back. Hold __________ seconds.  Slowly release your grasp and repeat the exercise with the opposite side. Repeat __________ times. Complete this exercise __________ times per day.  STRETCH - Flexion, Double Knee to Chest  Lie on a firm bed or floor with both legs extended in front of you.  Keeping one leg in contact with the floor, bring your opposite knee to your chest.  Tense your stomach muscles to support your back and then lift your other knee to your chest. Hold your legs in place by either grabbing behind your thighs or at your knees.  Pull both knees toward your chest until you feel a gentle stretch in your low back. Hold __________ seconds.  Tense your stomach muscles and slowly return one leg at a time to the floor. Repeat __________ times. Complete this exercise __________ times per day.  STRETCH - Low Trunk Rotation   Lie on a firm bed or floor. Keeping your legs in front of  you, bend your knees so they are both pointed toward the ceiling and your feet are flat on the floor.  Extend your arms out to the side. This will stabilize your upper body by keeping your shoulders in contact with the floor.  Gently and slowly drop both knees together to one side until you feel a gentle stretch in your low back. Hold for __________ seconds.  Tense your stomach muscles to support your low back as you bring your knees back  to the starting position. Repeat the exercise to the other side. Repeat __________ times. Complete this exercise __________ times per day  EXTENSION RANGE OF MOTION AND FLEXIBILITY EXERCISES: STRETCH - Extension, Prone on Elbows  Lie on your stomach on the floor, a bed will be too soft. Place your palms about shoulder width apart and at the height of your head.  Place your elbows under your shoulders. If this is too painful, stack pillows under your chest.  Allow your body to relax so that your hips drop lower and make contact more completely with the floor.  Hold this position for __________ seconds.  Slowly return to lying flat on the floor. Repeat __________ times. Complete this exercise __________ times per day.  RANGE OF MOTION - Extension, Prone Press Ups  Lie on your stomach on the floor, a bed will be too soft. Place your palms about shoulder width apart and at the height of your head.  Keeping your back as relaxed as possible, slowly straighten your elbows while keeping your hips on the floor. You may adjust the placement of your hands to maximize your comfort. As you gain motion, your hands will come more underneath your shoulders.  Hold this position __________ seconds.  Slowly return to lying flat on the floor. Repeat __________ times. Complete this exercise __________ times per day.  STRENGTHENING EXERCISES - Sciatica  These exercises may help you when beginning to rehabilitate your injury. These exercises should be done near your "sweet spot." This is the neutral, low-back arch, somewhere between fully rounded and fully arched, that is your least painful position. When performed in this safe range of motion, these exercises can be used for people who have either a flexion or extension based injury. These exercises may resolve your symptoms with or without further involvement from your physician, physical therapist or athletic trainer. While completing these exercises,  remember:   Muscles can gain both the endurance and the strength needed for everyday activities through controlled exercises.  Complete these exercises as instructed by your physician, physical therapist or athletic trainer. Progress with the resistance and repetition exercises only as your caregiver advises.  You may experience muscle soreness or fatigue, but the pain or discomfort you are trying to eliminate should never worsen during these exercises. If this pain does worsen, stop and make certain you are following the directions exactly. If the pain is still present after adjustments, discontinue the exercise until you can discuss the trouble with your clinician. STRENGTHENING - Deep Abdominals, Pelvic Tilt   Lie on a firm bed or floor. Keeping your legs in front of you, bend your knees so they are both pointed toward the ceiling and your feet are flat on the floor.  Tense your lower abdominal muscles to press your low back into the floor. This motion will rotate your pelvis so that your tail bone is scooping upwards rather than pointing at your feet or into the floor.  With a gentle tension and even breathing,  hold this position for __________ seconds. Repeat __________ times. Complete this exercise __________ times per day.  STRENGTHENING - Abdominals, Crunches   Lie on a firm bed or floor. Keeping your legs in front of you, bend your knees so they are both pointed toward the ceiling and your feet are flat on the floor. Cross your arms over your chest.  Slightly tip your chin down without bending your neck.  Tense your abdominals and slowly lift your trunk high enough to just clear your shoulder blades. Lifting higher can put excessive stress on the low back and does not further strengthen your abdominal muscles.  Control your return to the starting position. Repeat __________ times. Complete this exercise __________ times per day.  STRENGTHENING - Quadruped, Opposite UE/LE  Lift  Assume a hands and knees position on a firm surface. Keep your hands under your shoulders and your knees under your hips. You may place padding under your knees for comfort.  Find your neutral spine and gently tense your abdominal muscles so that you can maintain this position. Your shoulders and hips should form a rectangle that is parallel with the floor and is not twisted.  Keeping your trunk steady, lift your right hand no higher than your shoulder and then your left leg no higher than your hip. Make sure you are not holding your breath. Hold this position __________ seconds.  Continuing to keep your abdominal muscles tense and your back steady, slowly return to your starting position. Repeat with the opposite arm and leg. Repeat __________ times. Complete this exercise __________ times per day.  STRENGTHENING - Abdominals and Quadriceps, Straight Leg Raise   Lie on a firm bed or floor with both legs extended in front of you.  Keeping one leg in contact with the floor, bend the other knee so that your foot can rest flat on the floor.  Find your neutral spine, and tense your abdominal muscles to maintain your spinal position throughout the exercise.  Slowly lift your straight leg off the floor about 6 inches for a count of 15, making sure to not hold your breath.  Still keeping your neutral spine, slowly lower your leg all the way to the floor. Repeat this exercise with each leg __________ times. Complete this exercise __________ times per day. POSTURE AND BODY MECHANICS CONSIDERATIONS - Sciatica Keeping correct posture when sitting, standing or completing your activities will reduce the stress put on different body tissues, allowing injured tissues a chance to heal and limiting painful experiences. The following are general guidelines for improved posture. Your physician or physical therapist will provide you with any instructions specific to your needs. While reading these  guidelines, remember:  The exercises prescribed by your provider will help you have the flexibility and strength to maintain correct postures.  The correct posture provides the optimal environment for your joints to work. All of your joints have less wear and tear when properly supported by a spine with good posture. This means you will experience a healthier, less painful body.  Correct posture must be practiced with all of your activities, especially prolonged sitting and standing. Correct posture is as important when doing repetitive low-stress activities (typing) as it is when doing a single heavy-load activity (lifting). RESTING POSITIONS Consider which positions are most painful for you when choosing a resting position. If you have pain with flexion-based activities (sitting, bending, stooping, squatting), choose a position that allows you to rest in a less flexed posture. You would want  to avoid curling into a fetal position on your side. If your pain worsens with extension-based activities (prolonged standing, working overhead), avoid resting in an extended position such as sleeping on your stomach. Most people will find more comfort when they rest with their spine in a more neutral position, neither too rounded nor too arched. Lying on a non-sagging bed on your side with a pillow between your knees, or on your back with a pillow under your knees will often provide some relief. Keep in mind, being in any one position for a prolonged period of time, no matter how correct your posture, can still lead to stiffness. PROPER SITTING POSTURE In order to minimize stress and discomfort on your spine, you must sit with correct posture Sitting with good posture should be effortless for a healthy body. Returning to good posture is a gradual process. Many people can work toward this most comfortably by using various supports until they have the flexibility and strength to maintain this posture on their  own. When sitting with proper posture, your ears will fall over your shoulders and your shoulders will fall over your hips. You should use the back of the chair to support your upper back. Your low back will be in a neutral position, just slightly arched. You may place a small pillow or folded towel at the base of your low back for support.  When working at a desk, create an environment that supports good, upright posture. Without extra support, muscles fatigue and lead to excessive strain on joints and other tissues. Keep these recommendations in mind: CHAIR:   A chair should be able to slide under your desk when your back makes contact with the back of the chair. This allows you to work closely.  The chair's height should allow your eyes to be level with the upper part of your monitor and your hands to be slightly lower than your elbows. BODY POSITION  Your feet should make contact with the floor. If this is not possible, use a foot rest.  Keep your ears over your shoulders. This will reduce stress on your neck and low back. INCORRECT SITTING POSTURES   If you are feeling tired and unable to assume a healthy sitting posture, do not slouch or slump. This puts excessive strain on your back tissues, causing more damage and pain. Healthier options include:  Using more support, like a lumbar pillow.  Switching tasks to something that requires you to be upright or walking.  Talking a brief walk.  Lying down to rest in a neutral-spine position. PROLONGED STANDING WHILE SLIGHTLY LEANING FORWARD  When completing a task that requires you to lean forward while standing in one place for a long time, place either foot up on a stationary 2-4 inch high object to help maintain the best posture. When both feet are on the ground, the low back tends to lose its slight inward curve. If this curve flattens (or becomes too large), then the back and your other joints will experience too much stress, fatigue more  quickly and can cause pain.  CORRECT STANDING POSTURES Proper standing posture should be assumed with all daily activities, even if they only take a few moments, like when brushing your teeth. As in sitting, your ears should fall over your shoulders and your shoulders should fall over your hips. You should keep a slight tension in your abdominal muscles to brace your spine. Your tailbone should point down to the ground, not behind your body,  resulting in an over-extended swayback posture.  INCORRECT STANDING POSTURES  Common incorrect standing postures include a forward head, locked knees and/or an excessive swayback. WALKING Walk with an upright posture. Your ears, shoulders and hips should all line-up. PROLONGED ACTIVITY IN A FLEXED POSITION When completing a task that requires you to bend forward at your waist or lean over a low surface, try to find a way to stabilize 3 of 4 of your limbs. You can place a hand or elbow on your thigh or rest a knee on the surface you are reaching across. This will provide you more stability so that your muscles do not fatigue as quickly. By keeping your knees relaxed, or slightly bent, you will also reduce stress across your low back. CORRECT LIFTING TECHNIQUES DO :   Assume a wide stance. This will provide you more stability and the opportunity to get as close as possible to the object which you are lifting.  Tense your abdominals to brace your spine; then bend at the knees and hips. Keeping your back locked in a neutral-spine position, lift using your leg muscles. Lift with your legs, keeping your back straight.  Test the weight of unknown objects before attempting to lift them.  Try to keep your elbows locked down at your sides in order get the best strength from your shoulders when carrying an object.  Always ask for help when lifting heavy or awkward objects. INCORRECT LIFTING TECHNIQUES DO NOT:   Lock your knees when lifting, even if it is a small  object.  Bend and twist. Pivot at your feet or move your feet when needing to change directions.  Assume that you cannot safely pick up a paperclip without proper posture.   This information is not intended to replace advice given to you by your health care provider. Make sure you discuss any questions you have with your health care provider.   Document Released: 01/30/2005 Document Revised: 06/16/2014 Document Reviewed: 05/14/2008 [employer] Interactive Patient Education [employer].

## 2015-04-08 ENCOUNTER — Encounter: Payer: Self-pay | Admitting: Physician Assistant

## 2015-06-23 ENCOUNTER — Other Ambulatory Visit: Payer: Self-pay | Admitting: Family Medicine

## 2015-06-23 NOTE — Telephone Encounter (Signed)
Medication filled to pharmacy as requested.   

## 2015-06-30 ENCOUNTER — Encounter: Payer: Self-pay | Admitting: Family Medicine

## 2015-06-30 ENCOUNTER — Ambulatory Visit (INDEPENDENT_AMBULATORY_CARE_PROVIDER_SITE_OTHER): Payer: BC Managed Care – PPO | Admitting: Family Medicine

## 2015-06-30 VITALS — BP 124/86 | HR 63 | Temp 98.1°F | Resp 16 | Ht 64.0 in | Wt 115.2 lb

## 2015-06-30 DIAGNOSIS — M5417 Radiculopathy, lumbosacral region: Secondary | ICD-10-CM

## 2015-06-30 DIAGNOSIS — M5416 Radiculopathy, lumbar region: Secondary | ICD-10-CM

## 2015-06-30 MED ORDER — PREDNISONE 10 MG PO TABS: ORAL_TABLET | ORAL | Status: DC

## 2015-06-30 MED ORDER — METHYLPREDNISOLONE ACETATE 80 MG/ML IJ SUSP
80.0000 mg | Freq: Once | INTRAMUSCULAR | Status: AC
  Administered 2015-06-30: 80 mg via INTRAMUSCULAR

## 2015-06-30 NOTE — Progress Notes (Signed)
   Subjective:    Patient ID: Dominique Harrell Seen, female    DOB: 08-14-1955, 60 y.o.   MRN: [id number]  HPI LBP- pt reports ongoing issue for pt, 'once a year it goes out'.  Pt has had multiple episodes this year- December, Feb, April, and again Sunday.  Was picking up a light weight gift bag and back seized up.  Today the pain is constant.  Pain starts in back and then radiates around hip to groin and down front leg.  Back pain is R sided.  No bowel or bladder incontinence.  No fevers.  Changing positions is the worse- able to sit or stand w/ minimal difficulty.   Review of Systems For ROS see HPI     Objective:   Physical Exam  Constitutional: She is oriented to person, place, and time. She appears well-developed and well-nourished.  Obviously uncomfortable  HENT:  Head: Normocephalic and atraumatic.  Musculoskeletal: She exhibits tenderness (TTP over R SI joint, pain w/ lumbar flexion>extension). She exhibits no edema.  Neurological: She is alert and oriented to person, place, and time. She has normal reflexes.  (-) SLR bilaterally  Skin: Skin is warm and dry.  Psychiatric: She has a normal mood and affect. Her behavior is normal. Thought content normal.  Vitals reviewed.         Assessment & Plan:

## 2015-06-30 NOTE — Patient Instructions (Signed)
Start the Prednisone tomorrow morning- take w/ food Use the flexeril for spasm and pain relief- will cause drowsiness Alternate heat/ice We'll call you with your MRI appt Call with any questions or concerns Hang in there!!!

## 2015-06-30 NOTE — Progress Notes (Signed)
Pre visit review using our clinic review tool, if applicable. No additional management support is needed unless otherwise documented below in the visit note. 

## 2015-07-01 NOTE — Assessment & Plan Note (Signed)
Recurrent.  Pt has had 4 episodes in the last 5 months which is more frequent and severe than she has previously had.  Depo-medrol given today and she will start prednisone tomorrow.  Will get imaging to assess given change in frequency and severity.  Reviewed supportive care and red flags that should prompt return.  Pt expressed understanding and is in agreement w/ plan.

## 2015-07-02 ENCOUNTER — Other Ambulatory Visit: Payer: Self-pay | Admitting: Family Medicine

## 2015-07-02 NOTE — Telephone Encounter (Signed)
Medication filled to pharmacy as requested.   

## 2015-07-05 ENCOUNTER — Telehealth: Payer: Self-pay | Admitting: Family Medicine

## 2015-07-05 DIAGNOSIS — M5416 Radiculopathy, lumbar region: Secondary | ICD-10-CM

## 2015-07-05 NOTE — Telephone Encounter (Signed)
Called [employer] in an attempt to get MRI lumbar spine approved.  I was told that despite her increasing frequency and severity of radicular LBP w/ new TTP over SI joint pt will need at least 6 weeks of conservative tx and lumbar films prior to approval of MRI.  Based on this, will order lumbar plain films and refer to PT.

## 2015-07-06 NOTE — Telephone Encounter (Signed)
Called patient and LMOVM to return call.     

## 2015-07-07 NOTE — Telephone Encounter (Signed)
Patient notified of PCP recommendations and is agreement and expresses an understanding.  

## 2015-07-13 ENCOUNTER — Ambulatory Visit (HOSPITAL_BASED_OUTPATIENT_CLINIC_OR_DEPARTMENT_OTHER)
Admission: RE | Admit: 2015-07-13 | Discharge: 2015-07-13 | Disposition: A | Discharge status: Home / Self Care | Payer: BC Managed Care – PPO | Source: Ambulatory Visit | Attending: Family Medicine | Admitting: Family Medicine

## 2015-07-13 DIAGNOSIS — M545 Low back pain: Secondary | ICD-10-CM | POA: Diagnosis not present

## 2015-07-13 DIAGNOSIS — M5417 Radiculopathy, lumbosacral region: Secondary | ICD-10-CM | POA: Diagnosis present

## 2015-07-13 DIAGNOSIS — M5416 Radiculopathy, lumbar region: Secondary | ICD-10-CM

## 2015-07-18 ENCOUNTER — Encounter: Payer: Self-pay | Admitting: Family Medicine

## 2015-07-21 ENCOUNTER — Ambulatory Visit (INDEPENDENT_AMBULATORY_CARE_PROVIDER_SITE_OTHER): Payer: BC Managed Care – PPO | Admitting: Family Medicine

## 2015-07-21 ENCOUNTER — Encounter: Payer: Self-pay | Admitting: Family Medicine

## 2015-07-21 VITALS — BP 121/82 | HR 60 | Temp 98.3°F | Resp 16 | Ht 64.0 in | Wt 115.0 lb

## 2015-07-21 DIAGNOSIS — R350 Frequency of micturition: Secondary | ICD-10-CM | POA: Diagnosis not present

## 2015-07-21 DIAGNOSIS — R300 Dysuria: Secondary | ICD-10-CM

## 2015-07-21 LAB — POC URINALSYSI DIPSTICK (AUTOMATED)
BILIRUBIN UA: NEGATIVE
Blood, UA: NEGATIVE
GLUCOSE UA: NEGATIVE
KETONES UA: NEGATIVE
Leukocytes, UA: NEGATIVE
Nitrite, UA: NEGATIVE
Protein, UA: NEGATIVE
SPEC GRAV UA: 1.02
Urobilinogen, UA: NEGATIVE
pH, UA: 7

## 2015-07-21 MED ORDER — FLUCONAZOLE 150 MG PO TABS: 150.0000 mg | ORAL_TABLET | Freq: Once | ORAL | Status: DC

## 2015-07-21 NOTE — Progress Notes (Signed)
Pre visit review using our clinic review tool, if applicable. No additional management support is needed unless otherwise documented below in the visit note. 

## 2015-07-21 NOTE — Patient Instructions (Signed)
We'll notify you of your urine culture results Take the Diflucan x1 dose Drink plenty of fluids to flush system Call with any questions or concerns Hang in there!!! [place]!!!

## 2015-07-21 NOTE — Progress Notes (Signed)
   Subjective:    Patient ID: Dominique Harrell Seen, female    DOB: [birth year], 60 y.o.   MRN: [id number]  HPI Urinary frequency- sxs started weekend after stopping Prednisone, 10 days ago.  No discharge, no pain w/ urination but 'it's a strange sensation- like it's swelling'.  No blood.  Sensation of incomplete emptying.  No change in TP or products.  + vaginal itching.     Review of Systems For ROS see HPI     Objective:   Physical Exam  Constitutional: She is oriented to person, place, and time. She appears well-developed and well-nourished. No distress.  HENT:  Head: Normocephalic and atraumatic.  Abdominal: Soft. She exhibits no distension. There is no tenderness (no suprapubic or CVA tenderness).  Neurological: She is alert and oriented to person, place, and time.  Skin: Skin is warm and dry.  Psychiatric: She has a normal mood and affect. Her behavior is normal. Thought content normal.  Vitals reviewed.         Assessment & Plan:  Urinary frequency- suspect she is diuresing from the water retention caused by her recent prednisone use.  UA is unremarkable but due to sxs will send for cx and tx prn.  Pt expressed understanding and is in agreement w/ plan.   Vaginal itching- suspect that she has a yeast vaginitis w/ her recent prednisone use.  Start diflucan and monitor for improvement.  Pt expressed understanding and is in agreement w/ plan.

## 2015-07-23 LAB — URINE CULTURE: Colony Count: 25000

## 2015-07-30 ENCOUNTER — Other Ambulatory Visit (HOSPITAL_COMMUNITY)
Admission: RE | Admit: 2015-07-30 | Discharge: 2015-07-30 | Disposition: A | Discharge status: Home / Self Care | Payer: BC Managed Care – PPO | Source: Ambulatory Visit | Attending: Family Medicine | Admitting: Family Medicine

## 2015-07-30 ENCOUNTER — Encounter: Payer: Self-pay | Admitting: Family Medicine

## 2015-07-30 ENCOUNTER — Ambulatory Visit (INDEPENDENT_AMBULATORY_CARE_PROVIDER_SITE_OTHER): Payer: BC Managed Care – PPO | Admitting: Family Medicine

## 2015-07-30 VITALS — BP 118/76 | HR 78 | Temp 98.0°F | Resp 16 | Ht 64.0 in | Wt 118.0 lb

## 2015-07-30 DIAGNOSIS — N760 Acute vaginitis: Secondary | ICD-10-CM | POA: Diagnosis not present

## 2015-07-30 DIAGNOSIS — R319 Hematuria, unspecified: Secondary | ICD-10-CM | POA: Diagnosis not present

## 2015-07-30 DIAGNOSIS — R300 Dysuria: Secondary | ICD-10-CM | POA: Diagnosis not present

## 2015-07-30 LAB — POC URINALSYSI DIPSTICK (AUTOMATED)
Bilirubin, UA: NEGATIVE
GLUCOSE UA: NEGATIVE
Ketones, UA: NEGATIVE
Leukocytes, UA: NEGATIVE
NITRITE UA: NEGATIVE
PH UA: 6
Protein, UA: NEGATIVE
SPEC GRAV UA: 1.025
UROBILINOGEN UA: 4

## 2015-07-30 MED ORDER — CIPROFLOXACIN HCL 500 MG PO TABS: 500.0000 mg | ORAL_TABLET | Freq: Two times a day (BID) | ORAL | Status: DC

## 2015-07-30 NOTE — Progress Notes (Signed)
   Subjective:    Patient ID: Dominique Harrell, female    DOB: 10/21/1955, 60 y.o.   MRN: [id number]  HPI Vaginal burning- pt took Diflucan 9 days ago but continues to have constant irritation and urinary frequency.  + suprapubic pressure.  sxs will wax and wane but not resolve.     Review of Systems For ROS see HPI     Objective:   Physical Exam  Constitutional: She appears well-developed and well-nourished. No distress.  Abdominal: Soft. Bowel sounds are normal. She exhibits no distension. There is tenderness (+ suprapubic but no CVA tenderness ). There is no rebound and no guarding.  Genitourinary: Vagina normal. No vaginal discharge found.  No external rashes  Vitals reviewed.         Assessment & Plan:  Urinary frequency/vaginal burning- pt continues to have urinary frequency despite negative UA last visit.  Was tx'd w/ diflucan due to burning after course of prednisone which was suspected to be due to yeast.  No improvement.  Wet prep collected today and urine sent for cx.  Due to urinary frequency and suprapubic pressure will start Cipro for possible UTI.  Reviewed supportive care and red flags that should prompt return.  Pt expressed understanding and is in agreement w/ plan.

## 2015-07-30 NOTE — Patient Instructions (Signed)
Follow up as needed I'll notify you of the urine culture and swab results Start the Cipro today- twice daily- take w/ food Drink plenty of fluids Call with any questions or concerns Have fun!!!

## 2015-07-30 NOTE — Progress Notes (Signed)
Pre visit review using our clinic review tool, if applicable. No additional management support is needed unless otherwise documented below in the visit note. 

## 2015-07-31 LAB — URINE CULTURE
Colony Count: NO GROWTH
ORGANISM ID, BACTERIA: NO GROWTH

## 2015-08-03 LAB — CERVICOVAGINAL ANCILLARY ONLY: WET PREP (BD AFFIRM): NEGATIVE

## 2015-08-05 ENCOUNTER — Ambulatory Visit: Payer: BC Managed Care – PPO | Admitting: Physical Therapy

## 2015-08-09 ENCOUNTER — Telehealth: Payer: Self-pay | Admitting: Family Medicine

## 2015-08-09 DIAGNOSIS — Z1231 Encounter for screening mammogram for malignant neoplasm of breast: Secondary | ICD-10-CM

## 2015-08-09 DIAGNOSIS — M810 Age-related osteoporosis without current pathological fracture: Secondary | ICD-10-CM

## 2015-08-09 NOTE — Telephone Encounter (Signed)
Please advise on the Dx for Bone density and Mammogram is just routine screening correct?

## 2015-08-09 NOTE — Telephone Encounter (Signed)
Caller name: Janett Billow with [facility] Can be reached: [phone] Fax: [phone]   Reason for call: Pt is scheduled for mammogram and bone density on 08/12/15 11:30am. Please fax order for bone density to be completed.

## 2015-08-09 NOTE — Telephone Encounter (Signed)
Orders were placed. Can we please fax them over for the [facility] on 08/12/15.

## 2015-08-09 NOTE — Telephone Encounter (Signed)
DEXA- osteoporosis Mammo- Z12.31

## 2015-08-09 NOTE — Telephone Encounter (Signed)
Orders faxed to [facility] as requested as requested.  Fax # [phone]

## 2015-08-11 ENCOUNTER — Encounter: Payer: Self-pay | Admitting: Rehabilitative and Restorative Service Providers"

## 2015-08-11 ENCOUNTER — Ambulatory Visit (INDEPENDENT_AMBULATORY_CARE_PROVIDER_SITE_OTHER): Payer: BC Managed Care – PPO | Admitting: Rehabilitative and Restorative Service Providers"

## 2015-08-11 DIAGNOSIS — M5416 Radiculopathy, lumbar region: Secondary | ICD-10-CM | POA: Diagnosis not present

## 2015-08-11 DIAGNOSIS — R29898 Other symptoms and signs involving the musculoskeletal system: Secondary | ICD-10-CM

## 2015-08-11 NOTE — Therapy (Signed)
[facility] [place] [place] [employer] [place], [place], [address] Phone: [phone]   Fax:  [phone]  Physical Therapy Evaluation  Patient Details  Name: Dominique Harrell MRN: [id number] Date of Birth: 09-21-1955 Referring Provider: Dr. Annye Asa   Encounter Date: 08/11/2015      PT End of Session - 08/11/15 0852    Visit Number 1   Number of Visits 12   Date for PT Re-Evaluation 09/26/15   PT Start Time 0852   PT Stop Time 0947   PT Time Calculation (min) 55 min   Activity Tolerance Patient tolerated treatment well      Past Medical History  Diagnosis Date  . Migraine   . Insomnia   . Hyperlipidemia     Past Surgical History  Procedure Laterality Date  . Oophorectomy  1999    left, dermoid cyst    There were no vitals filed for this visit.       Subjective Assessment - 08/11/15 0854    Subjective Patient reports that she has a history of her back "going out" about once a year for the past 20 years which resolved with rest and OTC meds. She has had 4 episodes since January. She had to have injections twice and a course of prednisone.    Pertinent History osteoporosis   Currently in Pain? No/denies   Pain Location Leg   Pain Orientation Right   Pain Descriptors / Indicators Constant;Shooting   Pain Radiating Towards low back to posterior hip to front of thigh - occasionally lateral leg not past the ankle    Pain Onset More than a month ago   Pain Frequency Intermittent   Aggravating Factors  unknown    Pain Relieving Factors rest; meds             [facility] PT Assessment - 08/11/15 0001    Assessment   Medical Diagnosis Rt lumbar radiculopathy   Referring Provider Dr. Annye Asa    Hand Dominance Left   Next MD Visit no return scheduled    Prior Therapy none for back    Precautions   Precautions None   Balance Screen   Has the patient fallen in the past 6 months No   Has the patient had  a decrease in activity level because of a fear of falling?  No   Is the patient reluctant to leave their home because of a fear of falling?  No   Prior Function   Level of Independence Independent   Vocation Retired   Museum/gallery curator for 25 years    Leisure household chores   Observation/Other Assessments   Observations Lt thigh larger than Rt - increased bulk in Lt hamstring noted    Focus on Therapeutic Outcomes (FOTO)  32% limitation    Sensation   Additional Comments WFL's    Posture/Postural Control   Posture Comments slight head forward; knees hyperextended    AROM   Lumbar Flexion 50% available    Lumbar Extension 60%   Lumbar - Right Side Bend 80%   Lumbar - Left Side Bend 80%   Lumbar - Right Rotation 50%   Lumbar - Left Rotation 50%   Strength   Overall Strength Comments 5/5   Flexibility   Hamstrings tight Rt 65 deg; Lt 65 deg    Quadriceps tight bilat heel to buttock ~ 100 deg    ITB tight Rt > Lt    Piriformis tight Rt >  LT    Palpation   Spinal mobility tightness lumbar spine with CPA mobs    SI assessment  WFL's    Palpation comment tight Rt piriformis/hip musculature                    [facility] Adult PT Treatment/Exercise - 08/11/15 0001    Self-Care   Self-Care Other Self-Care Comments   Other Self-Care Comments  Pt educated on body mechanics and engaging core with transitional movements.  Pt verbalized understanding.    Exercises   Exercises Lumbar;Knee/Hip   Lumbar Exercises: Stretches   Passive Hamstring Stretch 3 reps;30 seconds  each leg, supine with strap   Press Ups --  2 sec hold, 10 reps   Quad Stretch 3 reps;30 seconds  each leg, prone with strap   ITB Stretch 3 reps;30 seconds   Piriformis Stretch 2 reps;30 seconds  each side   Lumbar Exercises: Supine   Ab Set 5 seconds;5 reps  3 part core    Lumbar Exercises: Prone   Other Prone Lumbar Exercises prone pelvic press 5  sec hold x 10 reps                  PT Education - 08/11/15 1026    Education provided Yes   Education Details HEP    Person(s) Educated Patient   Methods Explanation;Demonstration;Tactile cues;Verbal cues;Handout   Comprehension Verbalized understanding;Returned demonstration;Verbal cues required;Tactile cues required             PT Long Term Goals - 08/11/15 0927    PT LONG TERM GOAL #1   Title Improve trunk and LE mobility and ROM 09/26/15    Time 6   Period Weeks   Status New   PT LONG TERM GOAL #2   Title instruct patient with return demo of good core stabilization program 09/26/15   Time 6   Period Weeks   Status New   PT LONG TERM GOAL #3   Title Increase hamstring flexibility to 80 deg bilat; quad flexibility to 120 deg knee flexion in prone bilat 09/26/15   Time 6   Period Weeks   Status New   PT LONG TERM GOAL #4   Title Independent in HEP with appropriate program for community 09/26/15   Time 6   Period Weeks   Status New   PT LONG TERM GOAL #5   Title Improve FOTO to </= 26% limitation 09/26/15   Time 6   Period Weeks   Status New               Plan - 08/11/15 0924    Clinical Impression Statement Dominique Harrell presents with recurrent LBP and Rt LE radicular pain. SHe has poor lumbar mobility and ROM; tightness through bilat LE's; muscular tightness to palpation Rt hip; poor core stability; poor body mechanics.    Rehab Potential Good   PT Frequency 1x / week   PT Duration 6 weeks   PT Treatment/Interventions Patient/family education;ADLs/Self Care Home Management;Cryotherapy;Electrical Stimulation;Iontophoresis 4mg /ml Dexamethasone;Moist Heat;Ultrasound;Manual techniques;Dry needling;Therapeutic activities;Therapeutic exercise   PT Next Visit Plan progress with flexibility to LE's and core stabilization; instruction in body mechaniics    Consulted and Agree with Plan of Care Patient      Patient will benefit from skilled therapeutic intervention in order to improve the following deficits  and impairments:  Postural dysfunction, Improper body mechanics, Decreased range of motion, Decreased mobility, Increased fascial restricitons, Increased muscle spasms, Decreased activity tolerance  Visit  Diagnosis: Radiculopathy, lumbar region - Plan: PT plan of care cert/re-cert  Other symptoms and signs involving the musculoskeletal system - Plan: PT plan of care cert/re-cert     Problem List Patient Active Problem List   Diagnosis Date Noted  . Lumbar back pain with radiculopathy affecting right lower extremity 04/07/2015  . Rhomboid muscle strain 04/12/2013  . 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 General medical examination 03/02/2011  . Left shoulder pain 06/21/2010  . SHOULDER IMPINGEMENT SYNDROME, LEFT 02/15/2010  . NEOPLASM OF UNCERTAIN BEHAVIOR OF SKIN 12/02/2009  . OTHER&UNSPECIFIED DISEASES THE ORAL SOFT TISSUES 09/06/2009  . Hyperlipidemia 06/08/2006  . DEPRESSION 06/08/2006  . MIGRAINE HEADACHE 06/08/2006  . INSOMNIA 06/08/2006    Celyn Nilda Simmer PT, MPH  08/11/2015, 10:29 AM  [facility] [place], [place], [address] Phone: [phone]   Fax:  [phone]  Name: Dominique Harrell MRN: [id number] Date of Birth: 18-Mar-1955

## 2015-08-11 NOTE — Patient Instructions (Addendum)
3 part core exercise    With neutral spine, tighten pelvic floor, then tighten abdominal muscles sucking your belly button to back bone, then tighten muscles in low back at waist. Hold 10 seconds. Repeat _10_ times. Do _several__ times a day.  * When you have mastered this, you can contract all 3 muscle groups at same time.   * Progress to do this activity sitting, standing, walking and functional activities.    Pelvic Press     Place hands under belly between navel and pubic bone, palms up. Feel pressure on hands. Increase pressure on hands by pressing pelvis down. This is NOT a pelvic tilt. Hold __5_ seconds. Relax. Repeat _10__ times. Once a day. KNEE: Quadriceps - Prone    Place strap around ankle. Bring ankle toward buttocks. Press hip into surface. Hold _30__ seconds. _2__ reps per set, _1-2__ sets per day, _5__ days per week  Press-Up    Press upper body upward, keeping hips in contact with floor. Keep lower back and buttocks relaxed. Hold _2-3___ seconds. Repeat _10___ times per set. Do __1__ sets per session. Do __1-2__ sessions per day.  Hamstring Step 1    Straighten left knee. Keep knee level with other knee or on bolster. Hold _30__ seconds. Relax knee by returning foot to start. Repeat _2-3__ times.   Outer Hip Stretch: Reclined IT Band Stretch (Strap)    Strap around opposite foot, pull across only as far as possible with shoulders on mat. Hold for _30___ seconds. Repeat _2-3___ times each leg.  Piriformis (Supine)    Cross legs, right on top. Gently pull other knee toward chest until stretch is felt in buttock/hip of top leg. Hold __30__ seconds. Repeat _2-3___ times per set. Do _1___ sets per session. Do _1-2___ sessions per day.   Sleeping on Back  Place pillow under knees. A pillow with cervical support and a roll around waist are also helpful. Copyright  VHI. All rights reserved.  Sleeping on Side Place pillow between knees. Use cervical  support under neck and a roll around waist as needed. Copyright  VHI. All rights reserved.   Sleeping on Stomach   If this is the only desirable sleeping position, place pillow under lower legs, and under stomach or chest as needed.  Posture - Sitting   Sit upright, head facing forward. Try using a roll to support lower back. Keep shoulders relaxed, and avoid rounded back. Keep hips level with knees. Avoid crossing legs for long periods. Stand to Sit / Sit to Stand   To sit: Bend knees to lower self onto front edge of chair, then scoot back on seat. To stand: Reverse sequence by placing one foot forward, and scoot to front of seat. Use rocking motion to stand up.   Work Height and Reach  Ideal work height is no more than 2 to 4 inches below elbow level when standing, and at elbow level when sitting. Reaching should be limited to arm's length, with elbows slightly bent.  Bending  Bend at hips and knees, not back. Keep feet shoulder-width apart.    Posture - Standing   Good posture is important. Avoid slouching and forward head thrust. Maintain curve in low back and align ears over shoul- ders, hips over ankles.  Alternating Positions   Alternate tasks and change positions frequently to reduce fatigue and muscle tension. Take rest breaks. Computer Work   Position work to Programmer, multimedia. Use proper work and seat height. Keep shoulders back and  down, wrists straight, and elbows at right angles. Use chair that provides full back support. Add footrest and lumbar roll as needed.  Getting Into / Out of Car  Lower self onto seat, scoot back, then bring in one leg at a time. Reverse sequence to get out.  Dressing  Lie on back to pull socks or slacks over feet, or sit and bend leg while keeping back straight.    Housework - Sink  Place one foot on ledge of cabinet under sink when standing at sink for prolonged periods.   Pushing / Pulling  Pushing is preferable to pulling.  Keep back in proper alignment, and use leg muscles to do the work.  Deep Squat   Squat and lift with both arms held against upper trunk. Tighten stomach muscles without holding breath. Use smooth movements to avoid jerking.  Avoid Twisting   Avoid twisting or bending back. Pivot around using foot movements, and bend at knees if needed when reaching for articles.  Carrying Luggage   Distribute weight evenly on both sides. Use a cart whenever possible. Do not twist trunk. Move body as a unit.   Lifting Principles .Maintain proper posture and head alignment. .Slide object as close as possible before lifting. .Move obstacles out of the way. .Test before lifting; ask for help if too heavy. .Tighten stomach muscles without holding breath. .Use smooth movements; do not jerk. .Use legs to do the work, and pivot with feet. .Distribute the work load symmetrically and close to the center of trunk. .Push instead of pull whenever possible.   Ask For Help   Ask for help and delegate to others when possible. Coordinate your movements when lifting together, and maintain the low back curve.  Log Roll   Lying on back, bend left knee and place left arm across chest. Roll all in one movement to the right. Reverse to roll to the left. Always move as one unit. Housework - Sweeping  Use long-handled equipment to avoid stooping.   Housework - Wiping  Position yourself as close as possible to reach work surface. Avoid straining your back.  Laundry - Unloading Wash   To unload small items at bottom of washer, lift leg opposite to arm being used to reach.  [place] close to area to be raked. Use arm movements to do the work. Keep back straight and avoid twisting.     Cart  When reaching into cart with one arm, lift opposite leg to keep back straight.   Getting Into / Out of Bed  Lower self to lie down on one side by raising legs and lowering head at the same time.  Use arms to assist moving without twisting. Bend both knees to roll onto back if desired. To sit up, start from lying on side, and use same move-ments in reverse. Housework - Vacuuming  Hold the vacuum with arm held at side. Step back and forth to move it, keeping head up. Avoid twisting.   Laundry - IT consultant so that bending and twisting can be avoided.   Laundry - Unloading Dryer  Squat down to reach into clothes dryer or use a reacher.  Gardening - Weeding / Probation officer or Kneel. Knee pads may be helpful.

## 2015-08-12 LAB — HM DEXA SCAN

## 2015-08-19 ENCOUNTER — Encounter: Payer: Self-pay | Admitting: General Practice

## 2015-08-20 ENCOUNTER — Telehealth: Payer: Self-pay | Admitting: Family Medicine

## 2015-08-20 ENCOUNTER — Ambulatory Visit (INDEPENDENT_AMBULATORY_CARE_PROVIDER_SITE_OTHER): Payer: BC Managed Care – PPO | Admitting: Physical Therapy

## 2015-08-20 DIAGNOSIS — R29898 Other symptoms and signs involving the musculoskeletal system: Secondary | ICD-10-CM

## 2015-08-20 DIAGNOSIS — M5416 Radiculopathy, lumbar region: Secondary | ICD-10-CM | POA: Diagnosis not present

## 2015-08-20 NOTE — Telephone Encounter (Signed)
Pt informed of the DEXA results.  Please advise if pt should remain on the fosamax. Per the DEXA results you had advised the pt to continue vitamin D and calcium, do you want her to continue the fosamax?

## 2015-08-20 NOTE — Patient Instructions (Signed)
Pelvic Press    KNEE: Flexion - Prone   Hold pelvic press. Bend knee, then return the foot down. Repeat on opposite leg. Do not raise hips. _5-10__ reps per set. When this is mastered, pull both heels up at same time, x 10 reps.  Once a day   Leg Lift: One-Leg   Press pelvis down. Keep knee straight; lengthen and lift one leg (from waist). Do not twist body. Keep other leg down. Hold _1__ seconds. Relax. Repeat 10 time. Repeat with other leg.  Axial Extension- Upper body sequence * always start with pelvic press    Lie on stomach with forehead resting on floor and arms at sides. Tuck chin in and raise head from floor without bending it up or down. Repeat ___10_ times per set. Do __1__ sets per session. Do _1___ sessions per day.  Progression:  Arms at side Arms in T shape Arms in W shape  Arms in M shape Arms in Y shape  [facility] [place], [place] [address]  [phone] (office) [phone] (fax)

## 2015-08-20 NOTE — Telephone Encounter (Signed)
Pt states that she received a call from [facility] to schedule an appt, pt states that she was just there for a bone density test and is unsure if she needs to go back, pt also asking for results of that test

## 2015-08-20 NOTE — Telephone Encounter (Signed)
Patient notified of PCP recommendations and is agreement and expresses an understanding.  

## 2015-08-20 NOTE — Telephone Encounter (Signed)
Yes- continue the Fosamax

## 2015-08-20 NOTE — Therapy (Signed)
Dominique Harrell, [place], [address] Phone: [phone]   Fax:  [phone]  Physical Therapy Treatment  Patient Details  Name: Dominique Harrell MRN: [id number] Date of Birth: [birth year] Referring Provider: Dr. Annye Asa   Encounter Date: 08/20/2015      PT End of Session - 08/20/15 0812    Visit Number 2   Number of Visits 12   Date for PT Re-Evaluation 09/26/15   PT Start Time 0803   PT Stop Time 0850   PT Time Calculation (min) 47 min      Past Medical History  Diagnosis Date  . Migraine   . Insomnia   . Hyperlipidemia     Past Surgical History  Procedure Laterality Date  . Oophorectomy  1999    left, dermoid cyst    There were no vitals filed for this visit.      Subjective Assessment - 08/20/15 0812    Subjective [name] reports her flexility is improving.  She states she's been compliant with HEP. Some discomfort in upper back a few hours after completing HEP; not sure if her form is poor.  She brought her TENS unit for instruction on use.     Pertinent History osteoporosis   Currently in Pain? No/denies            [facility] PT Assessment - 08/20/15 0001    Assessment   Medical Diagnosis Rt lumbar radiculopathy   Hand Dominance Left   Next MD Visit no return scheduled    Prior Therapy none for back    Flexibility   Hamstrings Lt 72 deg, Rt 74   Quadriceps Rt knee 135 deg, Lt 132 deg            [facility] Adult PT Treatment/Exercise - 08/20/15 0001    Self-Care   Self-Care Other Self-Care Comments   Other Self-Care Comments  Pt educated on use and parameters for application of TENS unit.  Pt verbalized understanding and returned demo of TENS operation.    Exercises   Exercises Lumbar;Knee/Hip   Lumbar Exercises: Stretches   Passive Hamstring Stretch 2 reps;30 seconds   Press Ups 3 reps  5 sec hold. Pt shown POE as alternative   Quad Stretch 30 seconds;2 reps  each  leg, prone with strap   ITB Stretch 30 seconds;2 reps   Piriformis Stretch 2 reps;30 seconds  each side   Piriformis Stretch Limitations (given strap for assistance)   Lumbar Exercises: Supine   Ab Set 5 reps;5 seconds   Lumbar Exercises: Prone   Other Prone Lumbar Exercises Prone pelvic press x 5 sec hold x 5 reps.  Pelvic press with unilateral knee flexion/ext x 5 each leg, then bilat knee flex x 5 reps    Knee/Hip Exercises: Stretches   Gastroc Stretch Right;Left;3 reps;30 seconds   Soleus Stretch Right;Left;2 reps;30 seconds   Knee/Hip Exercises: Aerobic   Nustep L5: 5 min                 PT Education - 08/20/15 0831    Education provided Yes   Education Details HEP, TENS use.    Person(s) Educated Patient   Methods Explanation;Handout   Comprehension Verbalized understanding;Returned demonstration             PT Long Term Goals - 08/20/15 0835    PT LONG TERM GOAL #1   Title Improve trunk and LE mobility and ROM 09/26/15    Time  6   Period Weeks   Status On-going   PT LONG TERM GOAL #2   Title instruct patient with return demo of good core stabilization program 09/26/15   Time 6   Period Weeks   Status On-going   PT LONG TERM GOAL #3   Title Increase hamstring flexibility to 80 deg bilat; quad flexibility to 120 deg knee flexion in prone bilat 09/26/15   Time 6   Period Weeks   Status Partially Met   PT LONG TERM GOAL #4   Title Independent in HEP with appropriate program for community 09/26/15   Time 6   Period Weeks   Status On-going   PT LONG TERM GOAL #5   Title Improve FOTO to </= 26% limitation 09/26/15   Time 6   Period Weeks   Status On-going               Plan - 08/20/15 4196    Clinical Impression Statement Pt demonstrated improved flexibility; has partially met LTG # 3. Pt demonstrated good mulitifidi contraction with pelvic press; no cues needed for proper engagement. Progressing well towards goals.    Rehab Potential Good   PT  Frequency 1x / week   PT Duration 6 weeks   PT Treatment/Interventions Patient/family education;ADLs/Self Care Home Management;Cryotherapy;Electrical Stimulation;Iontophoresis 58m/ml Dexamethasone;Moist Heat;Ultrasound;Manual techniques;Dry needling;Therapeutic activities;Therapeutic exercise   PT Next Visit Plan progress with flexibility to LE's and core stabilization; review body mechaniics    Consulted and Agree with Plan of Care Patient      Patient will benefit from skilled therapeutic intervention in order to improve the following deficits and impairments:  Postural dysfunction, Improper body mechanics, Decreased range of motion, Decreased mobility, Increased fascial restricitons, Increased muscle spasms, Decreased activity tolerance  Visit Diagnosis: Radiculopathy, lumbar region  Other symptoms and signs involving the musculoskeletal system     Problem List Patient Active Problem List   Diagnosis Date Noted  . Lumbar back pain with radiculopathy affecting right lower extremity 04/07/2015  . Rhomboid muscle strain 04/12/2013  . 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 General medical examination 03/02/2011  . Left shoulder pain 06/21/2010  . SHOULDER IMPINGEMENT SYNDROME, LEFT 02/15/2010  . NEOPLASM OF UNCERTAIN BEHAVIOR OF SKIN 12/02/2009  . OTHER&UNSPECIFIED DISEASES THE ORAL SOFT TISSUES 09/06/2009  . Hyperlipidemia 06/08/2006  . DEPRESSION 06/08/2006  . MIGRAINE HEADACHE 06/08/2006  . INSOMNIA 06/08/2006   JKerin Perna PTA 08/20/2015 9:06 AM  [facility] [place] [address]Phone: [phone]  Fax:  [phone] Name: Dominique BIHLMRN: [id number] of Birth: 1April 08, 1957

## 2015-09-01 ENCOUNTER — Encounter: Payer: Self-pay | Admitting: General Practice

## 2015-09-01 ENCOUNTER — Ambulatory Visit (INDEPENDENT_AMBULATORY_CARE_PROVIDER_SITE_OTHER): Payer: BC Managed Care – PPO | Admitting: Physical Therapy

## 2015-09-01 DIAGNOSIS — R29898 Other symptoms and signs involving the musculoskeletal system: Secondary | ICD-10-CM | POA: Diagnosis not present

## 2015-09-01 DIAGNOSIS — M5416 Radiculopathy, lumbar region: Secondary | ICD-10-CM

## 2015-09-01 NOTE — Therapy (Addendum)
[facility] [place], [place], [address] Phone: [phone]   Fax:  [phone]  Physical Therapy Treatment  Patient Details  Name: Dominique Harrell MRN: [id number] Date of Birth: [birth year] Referring Provider: Dr. Annye Asa   Encounter Date: 09/01/2015      PT End of Session - 09/01/15 0848    Visit Number 3   Number of Visits 12   Date for PT Re-Evaluation 09/26/15   PT Start Time 0848   PT Stop Time 0931   PT Time Calculation (min) 43 min      Past Medical History  Diagnosis Date  . Migraine   . Insomnia   . Hyperlipidemia     Past Surgical History  Procedure Laterality Date  . Oophorectomy  1999    left, dermoid cyst    There were no vitals filed for this visit.      Subjective Assessment - 09/01/15 0853    Subjective Pt reports she has not had any pain in back since April.  She rode her bike for 5 miles this week without any pain or discomfort.  Continues to have tension on back of Lt thigh after exercising, but it resolves with rest.    Pertinent History osteoporosis/ osteopenia   Currently in Pain? No/denies            [facility] PT Assessment - 09/01/15 0001    Assessment   Medical Diagnosis Rt lumbar radiculopathy   Hand Dominance Left   Next MD Visit PRN   Observation/Other Assessments   Focus on Therapeutic Outcomes (FOTO)  18% limited.     AROM   Lumbar Flexion Able to touch shins.    Lumbar - Right Side Bend WNL   Lumbar - Left Side Bend WNL   Lumbar - Right Rotation WNL    Lumbar - Left Rotation WNL   Flexibility   Hamstrings Lt 84 (2nd attempt), Rt 87         [facility] Adult PT Treatment/Exercise - 09/01/15 0001    Lumbar Exercises: Stretches   Passive Hamstring Stretch 2 reps;20 seconds   Lumbar Exercises: Aerobic   Stationary Bike NuStep L5: 6 min    Lumbar Exercises: Supine   Ab Set 5 reps;5 seconds   Clam 10 reps  with TA contraciton   Heel Slides 5  reps  TA engaged. Each leg.    Bent Knee Raise 10 reps  with TA contraction    Bent Knee Raise Limitations (added opp arm raise)    Bridge 10 reps  feet on green ball, arms crossed.    Lumbar Exercises: Prone   Other Prone Lumbar Exercises prone pelvic press 5 sec hold x 5 reps;  press with hip ext (knee straight x 5 reps each leg x 2 sets;  bilat knee flex/ext x 10 reps.     Other Prone Lumbar Exercises Axial extension with arms at side x 5 reps, goal posts x 5 reps, T's x 5 reps.  HIGH KNEELING:  ball roll outs with forearms on green therapy ball x 8 reps (mirror for visual feedback)                 PT Education - 09/01/15 1243    Education provided Yes   Education Details HEP   Person(s) Educated Patient   Methods Handout;Explanation;Demonstration   Comprehension Verbalized understanding;Returned demonstration             PT Long Term Goals -  09/01/15 1237    PT LONG TERM GOAL #1   Title Improve trunk and LE mobility and ROM 09/26/15    Time 6   Period Weeks   Status Partially Met   PT LONG TERM GOAL #2   Title instruct patient with return demo of good core stabilization program 09/26/15   Time 6   Period Weeks   Status Achieved   PT LONG TERM GOAL #3   Title Increase hamstring flexibility to 80 deg bilat; quad flexibility to 120 deg knee flexion in prone bilat 09/26/15   Time 6   Period Weeks   Status Achieved   PT LONG TERM GOAL #4   Title Independent in HEP with appropriate program for community 09/26/15   Time 6   Period Weeks   Status Achieved   PT LONG TERM GOAL #5   Title Improve FOTO to </= 26% limitation 09/26/15   Time 6   Period Weeks   Status Achieved               Plan - 09/01/15 9211    Clinical Impression Statement Pt reported some discomfort in Lt hamstring area with hip extension. Otherwise pt tolerated all exercises well, without any production of symptoms in low back or radicular.  Pt has partially met her goals , but states she  is satisfied with current level of function.  Pt requests to d/c to HEP.    Rehab Potential Good   PT Frequency 1x / week   PT Duration 6 weeks   PT Treatment/Interventions Patient/family education;ADLs/Self Care Home Management;Cryotherapy;Electrical Stimulation;Iontophoresis '4mg'$ /ml Dexamethasone;Moist Heat;Ultrasound;Manual techniques;Dry needling;Therapeutic activities;Therapeutic exercise   PT Next Visit Plan Spoke to supervising PT; will d/c to HEP at this time.    Consulted and Agree with Plan of Care Patient      Patient will benefit from skilled therapeutic intervention in order to improve the following deficits and impairments:  Postural dysfunction, Improper body mechanics, Decreased range of motion, Decreased mobility, Increased fascial restricitons, Increased muscle spasms, Decreased activity tolerance  Visit Diagnosis: Radiculopathy, lumbar region  Other symptoms and signs involving the musculoskeletal system     Problem List Patient Active Problem List   Diagnosis Date Noted  . Lumbar back pain with radiculopathy affecting right lower extremity 04/07/2015  . Rhomboid muscle strain 04/12/2013  . 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 General medical examination 03/02/2011  . Left shoulder pain 06/21/2010  . SHOULDER IMPINGEMENT SYNDROME, LEFT 02/15/2010  . NEOPLASM OF UNCERTAIN BEHAVIOR OF SKIN 12/02/2009  . OTHER&UNSPECIFIED DISEASES THE ORAL SOFT TISSUES 09/06/2009  . Hyperlipidemia 06/08/2006  . DEPRESSION 06/08/2006  . MIGRAINE HEADACHE 06/08/2006  . INSOMNIA 06/08/2006   Kerin Perna, PTA 09/01/2015 12:43 PM  [address] [place], [place], [address] Phone: [phone]   Fax:  [phone]  Name: Dominique Harrell MRN: [id number] Date of Birth: Aug 19, 1955    PHYSICAL THERAPY DISCHARGE SUMMARY  Visits from Start of Care: 3  Current functional level related to goals /  functional outcomes: Progressing well toward stated goals of therapy. Patient requested d/c and felt confident in continuing with independent HEP    Remaining deficits: See progress note for discharge status    Education / Equipment: HEP Plan: Patient agrees to discharge.  Patient goals were partially met. Patient is being discharged due to the patient's request.  ?????     Celyn P. Helene Kelp PT, MPH 10/06/15 1:06 PM

## 2015-09-01 NOTE — Patient Instructions (Signed)
  Abdominal Bracing With Pelvic Floor (Hook-Lying)   With neutral spine, tighten pelvic floor and abdominals. Hold 10 seconds. Repeat __10_ times. Do _1__ times a day.   Knee to Chest: Transverse Plane Stability   Bring one knee up, then return. Be sure pelvis does not roll side to side. Keep pelvis still. Lift knee __10_ times each leg. Restabilize pelvis. Repeat with other leg.  Can add opposite arm lift (don't let back arch). Do _1-2__ sets, _1__ times per day.  Hip External Rotation With Pillow: Transverse Plane Stability   One knee bent, one leg straight, on pillow. Slowly roll bent knee out. Be sure pelvis does not rotate. Do _10__ times. Restabilize pelvis. Repeat with other leg. Do _1-2__ sets, _1__ times per day.  Heel Slide: 4-10 Inches - Transverse Plane Stability   Slide heel 4 inches down. Be sure pelvis does not rotate. Do _10__ times. Restabilize pelvis. Repeat with other leg. Do __1_ sets, _1__ times per day.   [facility] [place], [place] [address]  [phone] (office) [phone] (fax)

## 2015-11-04 ENCOUNTER — Other Ambulatory Visit (INDEPENDENT_AMBULATORY_CARE_PROVIDER_SITE_OTHER): Payer: BC Managed Care – PPO

## 2015-11-04 ENCOUNTER — Encounter: Payer: Self-pay | Admitting: Family Medicine

## 2015-11-04 ENCOUNTER — Ambulatory Visit (INDEPENDENT_AMBULATORY_CARE_PROVIDER_SITE_OTHER): Payer: BC Managed Care – PPO | Admitting: Family Medicine

## 2015-11-04 VITALS — BP 120/68 | HR 65 | Temp 98.1°F | Resp 16 | Ht 64.0 in | Wt 120.4 lb

## 2015-11-04 DIAGNOSIS — Z1159 Encounter for screening for other viral diseases: Secondary | ICD-10-CM

## 2015-11-04 DIAGNOSIS — Z0000 Encounter for general adult medical examination without abnormal findings: Secondary | ICD-10-CM

## 2015-11-04 DIAGNOSIS — M6289 Other specified disorders of muscle: Secondary | ICD-10-CM | POA: Insufficient documentation

## 2015-11-04 MED ORDER — ALENDRONATE SODIUM 70 MG PO TABS: ORAL_TABLET | ORAL | 1 refills | Status: DC

## 2015-11-04 MED ORDER — SIMVASTATIN 40 MG PO TABS: ORAL_TABLET | ORAL | 1 refills | Status: DC

## 2015-11-04 NOTE — Patient Instructions (Signed)
Follow up in 6 months to recheck cholesterol Schedule a lab appt at your convenience- please arrive fasting We'll call you with your ultrasound appt Keep up the good work on healthy diet and regular exercise- you look great!!! Call with any questions or concerns Happy Fall/Retirement!!!

## 2015-11-04 NOTE — Assessment & Plan Note (Signed)
Pt's PE WNL w/ exception of marked L hamstring hypertrophy.  UTD on GYN, mammo, DEXA.  Check labs.  Anticipatory guidance provided.

## 2015-11-04 NOTE — Progress Notes (Signed)
Pre visit review using our clinic review tool, if applicable. No additional management support is needed unless otherwise documented below in the visit note. 

## 2015-11-04 NOTE — Progress Notes (Signed)
   Subjective:    Patient ID: Dominique Harrell Seen, female    DOB: Jun 23, 1955, 60 y.o.   MRN: [id number]  HPI CPE- UTD on GYN, colonoscopy due 2019.  UTD on Zostavax, will hold off on flu until later in the season.     Review of Systems Patient reports no vision/ hearing changes, adenopathy,fever, weight change,  persistant/recurrent hoarseness , swallowing issues, chest pain, palpitations, edema, persistant/recurrent cough, hemoptysis, dyspnea (rest/exertional/paroxysmal nocturnal), gastrointestinal bleeding (melena, rectal bleeding), abdominal pain, significant heartburn, bowel changes, GU symptoms (dysuria, hematuria, incontinence), Gyn symptoms (abnormal  bleeding, pain),  syncope, focal weakness, memory loss, numbness & tingling, skin/hair/nail changes, abnormal bruising or bleeding, anxiety, or depression.     Objective:   Physical Exam General Appearance:    Alert, cooperative, no distress, appears stated age  Head:    Normocephalic, without obvious abnormality, atraumatic  Eyes:    PERRL, conjunctiva/corneas clear, EOM's intact, fundi    benign, both eyes  Ears:    Normal TM's and external ear canals, both ears  Nose:   Nares normal, septum midline, mucosa normal, no drainage    or sinus tenderness  Throat:   Lips, mucosa, and tongue normal; teeth and gums normal  Neck:   Supple, symmetrical, trachea midline, no adenopathy;    Thyroid: no enlargement/tenderness/nodules  Back:     Symmetric, no curvature, ROM normal, no CVA tenderness  Lungs:     Clear to auscultation bilaterally, respirations unlabored  Chest Wall:    No tenderness or deformity   Heart:    Regular rate and rhythm, S1 and S2 normal, no murmur, rub   or gallop  Breast Exam:    Deferred to GYN  Abdomen:     Soft, non-tender, bowel sounds active all four quadrants,    no masses, no organomegaly  Genitalia:    Deferred to GYN  Rectal:    Extremities:   Extremities normal, atraumatic, no cyanosis or edema Marked  hypertrophy of L hamstring, no TTP but firm  Pulses:   2+ and symmetric all extremities  Skin:   Skin color, texture, turgor normal, no rashes or lesions  Lymph nodes:   Cervical, supraclavicular, and axillary nodes normal  Neurologic:   CNII-XII intact, normal strength, sensation and reflexes    throughout          Assessment & Plan:

## 2015-11-04 NOTE — Assessment & Plan Note (Signed)
New.  Pt reports she noticed this over the summer when her shorts were much tighter on the L leg.  Area is firm, non-tender.  Lipoma vs soft tissue mass.  Get [place] to assess and proceed w/ additional imaging if needed.  Pt expressed understanding and is in agreement w/ plan.

## 2015-11-05 LAB — HEPATIC FUNCTION PANEL
ALT: 28 U/L (ref 0–35)
AST: 24 U/L (ref 0–37)
Albumin: 4.5 g/dL (ref 3.5–5.2)
Alkaline Phosphatase: 40 U/L (ref 39–117)
BILIRUBIN DIRECT: 0 mg/dL (ref 0.0–0.3)
BILIRUBIN TOTAL: 0.7 mg/dL (ref 0.2–1.2)
Total Protein: 7.3 g/dL (ref 6.0–8.3)

## 2015-11-05 LAB — BASIC METABOLIC PANEL
BUN: 15 mg/dL (ref 6–23)
CHLORIDE: 105 meq/L (ref 96–112)
CO2: 31 mEq/L (ref 19–32)
Calcium: 9.2 mg/dL (ref 8.4–10.5)
Creatinine, Ser: 0.78 mg/dL (ref 0.40–1.20)
GFR: 79.89 mL/min (ref >60.00)
GLUCOSE: 85 mg/dL (ref 70–99)
POTASSIUM: 4.4 meq/L (ref 3.5–5.1)
SODIUM: 143 meq/L (ref 135–145)

## 2015-11-05 LAB — CBC WITH DIFFERENTIAL/PLATELET
BASOS ABS: 0 10*3/uL (ref 0.0–0.1)
Basophils Relative: 0.4 % (ref 0.0–3.0)
EOS ABS: 0.1 10*3/uL (ref 0.0–0.7)
Eosinophils Relative: 1.4 % (ref 0.0–5.0)
HCT: 40.9 % (ref 36.0–46.0)
Hemoglobin: 14.4 g/dL (ref 12.0–15.0)
LYMPHS ABS: 1.8 10*3/uL (ref 0.7–4.0)
Lymphocytes Relative: 28.9 % (ref 12.0–46.0)
MCHC: 35.1 g/dL (ref 30.0–36.0)
MCV: 92.7 fl (ref 78.0–100.0)
MONO ABS: 0.2 10*3/uL (ref 0.1–1.0)
MONOS PCT: 4.1 % (ref 3.0–12.0)
NEUTROS ABS: 4 10*3/uL (ref 1.4–7.7)
NEUTROS PCT: 65.2 % (ref 43.0–77.0)
PLATELETS: 201 10*3/uL (ref 150.0–400.0)
RBC: 4.41 Mil/uL (ref 3.87–5.11)
RDW: 12.8 % (ref 11.5–15.5)
WBC: 6.1 10*3/uL (ref 4.0–10.5)

## 2015-11-05 LAB — HEPATITIS C ANTIBODY: HCV AB: NEGATIVE

## 2015-11-05 LAB — TSH: TSH: 1.2 u[IU]/mL (ref 0.35–4.50)

## 2015-11-05 LAB — LIPID PANEL
CHOLESTEROL: 154 mg/dL (ref 0–200)
HDL: 63 mg/dL (ref >39.00)
LDL CALC: 76 mg/dL (ref 0–99)
NONHDL: 90.67
Total CHOL/HDL Ratio: 2
Triglycerides: 73 mg/dL (ref 0.0–149.0)
VLDL: 14.6 mg/dL (ref 0.0–40.0)

## 2015-11-05 LAB — VITAMIN D 25 HYDROXY (VIT D DEFICIENCY, FRACTURES): VITD: 40.41 ng/mL (ref 30.00–100.00)

## 2015-11-08 ENCOUNTER — Ambulatory Visit (HOSPITAL_BASED_OUTPATIENT_CLINIC_OR_DEPARTMENT_OTHER)
Admission: RE | Admit: 2015-11-08 | Discharge: 2015-11-08 | Disposition: A | Discharge status: Home / Self Care | Payer: BC Managed Care – PPO | Source: Ambulatory Visit | Attending: Family Medicine | Admitting: Family Medicine

## 2015-11-08 DIAGNOSIS — M6289 Other specified disorders of muscle: Secondary | ICD-10-CM | POA: Diagnosis present

## 2015-11-08 DIAGNOSIS — R2242 Localized swelling, mass and lump, left lower limb: Secondary | ICD-10-CM | POA: Diagnosis not present

## 2015-11-09 ENCOUNTER — Other Ambulatory Visit: Payer: Self-pay | Admitting: Family Medicine

## 2015-11-09 DIAGNOSIS — R2242 Localized swelling, mass and lump, left lower limb: Secondary | ICD-10-CM

## 2015-11-13 ENCOUNTER — Ambulatory Visit (HOSPITAL_BASED_OUTPATIENT_CLINIC_OR_DEPARTMENT_OTHER)
Admission: RE | Admit: 2015-11-13 | Discharge: 2015-11-13 | Disposition: A | Discharge status: Home / Self Care | Payer: BC Managed Care – PPO | Source: Ambulatory Visit | Attending: Family Medicine | Admitting: Family Medicine

## 2015-11-13 DIAGNOSIS — R2242 Localized swelling, mass and lump, left lower limb: Secondary | ICD-10-CM | POA: Insufficient documentation

## 2015-11-13 MED ORDER — GADOBENATE DIMEGLUMINE 529 MG/ML IV SOLN: 10.0000 mL | Freq: Once | INTRAVENOUS | Status: DC | PRN

## 2015-11-15 ENCOUNTER — Other Ambulatory Visit: Payer: Self-pay | Admitting: Family Medicine

## 2015-11-15 DIAGNOSIS — C4922 Malignant neoplasm of connective and soft tissue of left lower limb, including hip: Secondary | ICD-10-CM

## 2015-11-15 NOTE — Progress Notes (Signed)
Called pt and lmovm to return call.

## 2015-12-13 LAB — HM PAP SMEAR

## 2016-03-08 LAB — HM MAMMOGRAPHY

## 2016-03-14 ENCOUNTER — Encounter: Payer: Self-pay | Admitting: General Practice

## 2016-05-08 ENCOUNTER — Encounter: Payer: Self-pay | Admitting: Family Medicine

## 2016-05-08 ENCOUNTER — Ambulatory Visit (INDEPENDENT_AMBULATORY_CARE_PROVIDER_SITE_OTHER): Payer: BC Managed Care – PPO | Admitting: Family Medicine

## 2016-05-08 VITALS — BP 120/70 | HR 54 | Temp 98.1°F | Resp 16 | Ht 64.0 in | Wt 119.1 lb

## 2016-05-08 DIAGNOSIS — E785 Hyperlipidemia, unspecified: Secondary | ICD-10-CM

## 2016-05-08 DIAGNOSIS — M542 Cervicalgia: Secondary | ICD-10-CM | POA: Diagnosis not present

## 2016-05-08 LAB — BASIC METABOLIC PANEL
BUN: 18 mg/dL (ref 6–23)
CALCIUM: 9.5 mg/dL (ref 8.4–10.5)
CO2: 30 mEq/L (ref 19–32)
Chloride: 104 mEq/L (ref 96–112)
Creatinine, Ser: 0.72 mg/dL (ref 0.40–1.20)
GFR: 87.47 mL/min (ref >60.00)
GLUCOSE: 83 mg/dL (ref 70–99)
Potassium: 4.3 mEq/L (ref 3.5–5.1)
Sodium: 141 mEq/L (ref 135–145)

## 2016-05-08 LAB — HEPATIC FUNCTION PANEL
ALBUMIN: 4.9 g/dL (ref 3.5–5.2)
ALT: 28 U/L (ref 0–35)
AST: 19 U/L (ref 0–37)
Alkaline Phosphatase: 39 U/L (ref 39–117)
BILIRUBIN TOTAL: 0.7 mg/dL (ref 0.2–1.2)
Bilirubin, Direct: 0.1 mg/dL (ref 0.0–0.3)
Total Protein: 7.1 g/dL (ref 6.0–8.3)

## 2016-05-08 LAB — LIPID PANEL
CHOL/HDL RATIO: 3
CHOLESTEROL: 162 mg/dL (ref 0–200)
HDL: 61.9 mg/dL (ref >39.00)
LDL CALC: 87 mg/dL (ref 0–99)
NonHDL: 99.72
Triglycerides: 62 mg/dL (ref 0.0–149.0)
VLDL: 12.4 mg/dL (ref 0.0–40.0)

## 2016-05-08 MED ORDER — PREDNISONE 10 MG PO TABS: ORAL_TABLET | ORAL | 0 refills | Status: DC

## 2016-05-08 NOTE — Progress Notes (Signed)
   Subjective:    Patient ID: [name], female    DOB: 1955/09/13, 61 y.o.   MRN: [id number]  HPI Hyperlipidemia- chronic problem, on simvastatin daily.  Denies abd pain, N/V, myalgias.  Exercising regularly  Neck/back pain- sxs started ~3.5 weeks ago.  Having pain radiating into both upper arms and shoulders, L>R.     Review of Systems For ROS see HPI     Objective:   Physical Exam  Constitutional: She is oriented to person, place, and time. She appears well-developed and well-nourished. No distress.  HENT:  Head: Normocephalic and atraumatic.  Eyes: Conjunctivae and EOM are normal. Pupils are equal, round, and reactive to light.  Neck: Normal range of motion. Neck supple. No thyromegaly present.  Cardiovascular: Normal rate, regular rhythm, normal heart sounds and intact distal pulses.   No murmur heard. Pulmonary/Chest: Effort normal and breath sounds normal. No respiratory distress.  Abdominal: Soft. She exhibits no distension. There is no tenderness.  Musculoskeletal: She exhibits tenderness (TTP over Trap spasm bilaterally). She exhibits no edema.  Lymphadenopathy:    She has no cervical adenopathy.  Neurological: She is alert and oriented to person, place, and time.  Skin: Skin is warm and dry.  Psychiatric: She has a normal mood and affect. Her behavior is normal.  Vitals reviewed.         Assessment & Plan:

## 2016-05-08 NOTE — Assessment & Plan Note (Signed)
New.  Pt has radicular pain from neck into both shoulders.  Start Prednisone taper.  If no improvement, will refer to ortho.  Reviewed supportive care and red flags that should prompt return.  Pt expressed understanding and is in agreement w/ plan.

## 2016-05-08 NOTE — Patient Instructions (Signed)
Schedule your complete physical in 6 months We'll notify you of your lab results and make any changes if needed Continue to work on healthy diet and regular exercise- you look great! Start the Prednisone as directed- take w/ food If no improvement in back/neck pain after prednisone- let me know! Call with any questions or concerns Enjoy your trip!!!

## 2016-05-08 NOTE — Progress Notes (Signed)
Pre visit review using our clinic review tool, if applicable. No additional management support is needed unless otherwise documented below in the visit note. 

## 2016-05-08 NOTE — Assessment & Plan Note (Signed)
Chronic problem.  Tolerating statin w/o difficulty.  Check labs.  Adjust meds prn  

## 2016-05-29 ENCOUNTER — Other Ambulatory Visit: Payer: Self-pay | Admitting: Family Medicine

## 2016-06-07 ENCOUNTER — Ambulatory Visit (INDEPENDENT_AMBULATORY_CARE_PROVIDER_SITE_OTHER): Payer: BC Managed Care – PPO | Admitting: Family Medicine

## 2016-06-07 ENCOUNTER — Encounter: Payer: Self-pay | Admitting: Family Medicine

## 2016-06-07 VITALS — BP 119/72 | HR 71 | Temp 98.1°F | Resp 16 | Ht 64.0 in | Wt 120.4 lb

## 2016-06-07 DIAGNOSIS — B351 Tinea unguium: Secondary | ICD-10-CM | POA: Diagnosis not present

## 2016-06-07 NOTE — Progress Notes (Signed)
   Subjective:    Patient ID: Dominique Harrell Seen, female    DOB: 09/28/1955, 61 y.o.   MRN: [id number]  HPI Foot pain- R great toe.  Pt reports nail had been yellow prior to injury but while she was in [place], grandchild dropped a block on her foot.  Lateral edge of nail peeled off.  No bruising or oozing.  Remainder of nail is peeling.  No pain.  Able to wear shoes w/o difficulty   Review of Systems For ROS see HPI     Objective:   Physical Exam  Constitutional: She is oriented to person, place, and time. She appears well-developed and well-nourished. No distress.  HENT:  Head: Normocephalic and atraumatic.  Cardiovascular: Intact distal pulses.   Neurological: She is alert and oriented to person, place, and time.  Skin: Skin is warm and dry. No erythema.  R great toe nail is yellowed and thickened at the base, peeling distally along medial margin and broken 1/2 way down nail on lateral margin  Psychiatric: She has a normal mood and affect. Her behavior is normal. Thought content normal.  Vitals reviewed.         Assessment & Plan:  Nail fungus- new.  Suspect this is why pt's nail broke so easily when the block was dropped on it.  No toe damage from the injury.  Pt is pain free.  No bruising, bleeding, signs of infxn.  Due to yellowed, thickened nail and the fact that remaining nail is peeling, will refer to podiatry for evaluation and tx.  Pt expressed understanding and is in agreement w/ plan.

## 2016-06-07 NOTE — Patient Instructions (Signed)
Follow up as needed/scheduled We'll call you with your podiatry appt Call with any questions or concerns Happy [place]!!!

## 2016-06-07 NOTE — Progress Notes (Signed)
Pre visit review using our clinic review tool, if applicable. No additional management support is needed unless otherwise documented below in the visit note. 

## 2016-06-28 ENCOUNTER — Other Ambulatory Visit: Payer: Self-pay | Admitting: Family Medicine

## 2016-08-29 ENCOUNTER — Other Ambulatory Visit: Payer: Self-pay | Admitting: Family Medicine

## 2016-09-21 ENCOUNTER — Other Ambulatory Visit: Payer: Self-pay | Admitting: General Practice

## 2016-09-21 MED ORDER — SIMVASTATIN 40 MG PO TABS: ORAL_TABLET | ORAL | 0 refills | Status: DC

## 2016-11-06 ENCOUNTER — Encounter: Payer: Self-pay | Admitting: Family Medicine

## 2016-11-06 ENCOUNTER — Ambulatory Visit (INDEPENDENT_AMBULATORY_CARE_PROVIDER_SITE_OTHER): Payer: BC Managed Care – PPO | Admitting: Family Medicine

## 2016-11-06 VITALS — BP 120/80 | HR 66 | Temp 98.0°F | Resp 16 | Ht 64.0 in | Wt 119.4 lb

## 2016-11-06 DIAGNOSIS — H6982 Other specified disorders of Eustachian tube, left ear: Secondary | ICD-10-CM

## 2016-11-06 MED ORDER — FLUTICASONE PROPIONATE 50 MCG/ACT NA SUSP: 2.0000 | Freq: Every day | NASAL | 6 refills | Status: DC

## 2016-11-06 MED ORDER — SIMVASTATIN 40 MG PO TABS: ORAL_TABLET | ORAL | 1 refills | Status: DC

## 2016-11-06 MED ORDER — ALENDRONATE SODIUM 70 MG PO TABS
ORAL_TABLET | ORAL | 3 refills | Status: DC
Start: 2016-11-06 — End: 2017-10-16

## 2016-11-06 NOTE — Progress Notes (Signed)
Pre visit review using our clinic review tool, if applicable. No additional management support is needed unless otherwise documented below in the visit note. 

## 2016-11-06 NOTE — Progress Notes (Signed)
   Subjective:    Patient ID: Dominique Harrell, female    DOB: 1955-02-23, 61 y.o.   MRN: [id number]  HPI Ear pain- L sided, started ~2 weeks ago.  Mild accompanying sore throat.  No fevers.  'i feel fine.  It's just the ear'.  Pain is intermittent.  Denies seasonal allergy sxs.  Had sharp pain when she went underwater when swimming last week.   Review of Systems For ROS see HPI     Objective:   Physical Exam  Constitutional: She appears well-developed and well-nourished. No distress.  HENT:  Head: Normocephalic and atraumatic.  Right Ear: Tympanic membrane normal.  Left Ear: Tympanic membrane is retracted.  Nose: Mucosal edema and rhinorrhea present. Right sinus exhibits no maxillary sinus tenderness and no frontal sinus tenderness. Left sinus exhibits no maxillary sinus tenderness and no frontal sinus tenderness.  Mouth/Throat: Mucous membranes are normal. Posterior oropharyngeal erythema (w/ PND) present.  Eyes: Pupils are equal, round, and reactive to light. Conjunctivae and EOM are normal.  Neck: Normal range of motion. Neck supple.  Cardiovascular: Normal rate, regular rhythm and normal heart sounds.   Pulmonary/Chest: Effort normal and breath sounds normal. No respiratory distress. She has no wheezes. She has no rales.  Lymphadenopathy:    She has no cervical adenopathy.  Vitals reviewed.         Assessment & Plan:  Eustachian tube dysfunction- new.  Pt's sxs and PE consistent w/ allergic congestion and eustachian tube dysfunction.  Start daily nasal steroid and add sudafed prn.  Reviewed supportive care and red flags that should prompt return.  Pt expressed understanding and is in agreement w/ plan.

## 2016-11-06 NOTE — Patient Instructions (Signed)
Follow up as needed or as scheduled This is called Eustachian tube dysfunction and is allergy related Start the Flonase- 2 sprays each nostril (or just the left nostril) daily Drink plenty of fluids Sudafed 1 tab up to 3x/day as needed for ear pressure Call with any questions or concerns Hang in there!!!

## 2016-11-08 ENCOUNTER — Encounter: Payer: BC Managed Care – PPO | Admitting: Family Medicine

## 2016-12-08 ENCOUNTER — Encounter: Payer: Self-pay | Admitting: Family Medicine

## 2016-12-08 ENCOUNTER — Ambulatory Visit (INDEPENDENT_AMBULATORY_CARE_PROVIDER_SITE_OTHER): Payer: BC Managed Care – PPO | Admitting: Family Medicine

## 2016-12-08 VITALS — BP 118/72 | HR 65 | Temp 97.9°F | Resp 16 | Ht 64.0 in | Wt 117.0 lb

## 2016-12-08 DIAGNOSIS — Z0000 Encounter for general adult medical examination without abnormal findings: Secondary | ICD-10-CM | POA: Diagnosis not present

## 2016-12-08 DIAGNOSIS — E785 Hyperlipidemia, unspecified: Secondary | ICD-10-CM | POA: Diagnosis not present

## 2016-12-08 LAB — HEPATIC FUNCTION PANEL
ALBUMIN: 4.7 g/dL (ref 3.5–5.2)
ALK PHOS: 53 U/L (ref 39–117)
ALT: 52 U/L — ABNORMAL HIGH (ref 0–35)
AST: 32 U/L (ref 0–37)
BILIRUBIN TOTAL: 0.8 mg/dL (ref 0.2–1.2)
Bilirubin, Direct: 0.2 mg/dL (ref 0.0–0.3)
Total Protein: 6.8 g/dL (ref 6.0–8.3)

## 2016-12-08 LAB — BASIC METABOLIC PANEL
BUN: 16 mg/dL (ref 6–23)
CHLORIDE: 103 meq/L (ref 96–112)
CO2: 32 mEq/L (ref 19–32)
Calcium: 9.6 mg/dL (ref 8.4–10.5)
Creatinine, Ser: 0.7 mg/dL (ref 0.40–1.20)
GFR: 90.19 mL/min (ref >60.00)
Glucose, Bld: 85 mg/dL (ref 70–99)
POTASSIUM: 4.4 meq/L (ref 3.5–5.1)
SODIUM: 141 meq/L (ref 135–145)

## 2016-12-08 LAB — CBC WITH DIFFERENTIAL/PLATELET
BASOS PCT: 0.5 % (ref 0.0–3.0)
Basophils Absolute: 0 10*3/uL (ref 0.0–0.1)
EOS PCT: 2.4 % (ref 0.0–5.0)
Eosinophils Absolute: 0.2 10*3/uL (ref 0.0–0.7)
HCT: 42.7 % (ref 36.0–46.0)
HEMOGLOBIN: 14.3 g/dL (ref 12.0–15.0)
LYMPHS ABS: 1.9 10*3/uL (ref 0.7–4.0)
Lymphocytes Relative: 29 % (ref 12.0–46.0)
MCHC: 33.6 g/dL (ref 30.0–36.0)
MCV: 94.3 fl (ref 78.0–100.0)
MONOS PCT: 4.3 % (ref 3.0–12.0)
Monocytes Absolute: 0.3 10*3/uL (ref 0.1–1.0)
Neutro Abs: 4.2 10*3/uL (ref 1.4–7.7)
Neutrophils Relative %: 63.8 % (ref 43.0–77.0)
Platelets: 218 10*3/uL (ref 150.0–400.0)
RBC: 4.52 Mil/uL (ref 3.87–5.11)
RDW: 12.7 % (ref 11.5–15.5)
WBC: 6.5 10*3/uL (ref 4.0–10.5)

## 2016-12-08 LAB — LIPID PANEL
CHOL/HDL RATIO: 3
CHOLESTEROL: 167 mg/dL (ref 0–200)
HDL: 64.5 mg/dL (ref >39.00)
LDL CALC: 88 mg/dL (ref 0–99)
NonHDL: 102.94
TRIGLYCERIDES: 73 mg/dL (ref 0.0–149.0)
VLDL: 14.6 mg/dL (ref 0.0–40.0)

## 2016-12-08 LAB — TSH: TSH: 1.66 u[IU]/mL (ref 0.35–4.50)

## 2016-12-08 NOTE — Assessment & Plan Note (Signed)
Chronic problem.  Tolerating statin w/o difficulty.  Check labs.  Adjust meds prn  

## 2016-12-08 NOTE — Progress Notes (Signed)
   Subjective:    Patient ID: Dominique Harrell, female    DOB: 1955/02/17, 61 y.o.   MRN: [id number]  HPI CPE- UTD on pap, mammo, and colonoscopy (due next year).  UTD on flu.    Review of Systems Patient reports no vision/ hearing changes, adenopathy,fever, weight change,  persistant/recurrent hoarseness , swallowing issues, chest pain, palpitations, edema, persistant/recurrent cough, hemoptysis, dyspnea (rest/exertional/paroxysmal nocturnal), gastrointestinal bleeding (melena, rectal bleeding), abdominal pain, significant heartburn, bowel changes, GU symptoms (dysuria, hematuria, incontinence), Gyn symptoms (abnormal  bleeding, pain),  syncope, focal weakness, memory loss, numbness & tingling, skin/hair/nail changes, abnormal bruising or bleeding, anxiety, or depression.     Objective:   Physical Exam General Appearance:    Alert, cooperative, no distress, appears stated age  Head:    Normocephalic, without obvious abnormality, atraumatic  Eyes:    PERRL, conjunctiva/corneas clear, EOM's intact, fundi    benign, both eyes  Ears:    Normal TM's and external ear canals, both ears  Nose:   Nares normal, septum midline, mucosa normal, no drainage    or sinus tenderness  Throat:   Lips, mucosa, and tongue normal; teeth and gums normal  Neck:   Supple, symmetrical, trachea midline, no adenopathy;    Thyroid: no enlargement/tenderness/nodules  Back:     Symmetric, no curvature, ROM normal, no CVA tenderness  Lungs:     Clear to auscultation bilaterally, respirations unlabored  Chest Wall:    No tenderness or deformity   Heart:    Regular rate and rhythm, S1 and S2 normal, no murmur, rub   or gallop  Breast Exam:    Deferred to mammo  Abdomen:     Soft, non-tender, bowel sounds active all four quadrants,    no masses, no organomegaly  Genitalia:    Deferred to GYN  Rectal:    Extremities:   Extremities normal, atraumatic, no cyanosis or edema  Pulses:   2+ and symmetric all extremities    Skin:   Skin color, texture, turgor normal, no rashes or lesions  Lymph nodes:   Cervical, supraclavicular, and axillary nodes normal  Neurologic:   CNII-XII intact, normal strength, sensation and reflexes    throughout          Assessment & Plan:

## 2016-12-08 NOTE — Assessment & Plan Note (Signed)
Pt's PE WNL.  UTD on GYN, colonoscopy, Tdap, immunizations.  Check labs.  Anticipatory guidance provided.

## 2016-12-08 NOTE — Patient Instructions (Signed)
Follow up in 6 months to recheck cholesterol We'll notify you of your lab results and make any changes if needed Continue to work on healthy diet and regular exercise- you look great! Call with any questions or concerns Happy Fall!!! 

## 2016-12-11 ENCOUNTER — Encounter: Payer: Self-pay | Admitting: Family Medicine

## 2016-12-25 ENCOUNTER — Other Ambulatory Visit (INDEPENDENT_AMBULATORY_CARE_PROVIDER_SITE_OTHER): Payer: BC Managed Care – PPO

## 2016-12-25 ENCOUNTER — Encounter: Payer: Self-pay | Admitting: General Practice

## 2016-12-25 DIAGNOSIS — R748 Abnormal levels of other serum enzymes: Secondary | ICD-10-CM | POA: Diagnosis not present

## 2016-12-25 LAB — HEPATIC FUNCTION PANEL
ALBUMIN: 4.5 g/dL (ref 3.5–5.2)
ALK PHOS: 48 U/L (ref 39–117)
ALT: 43 U/L — ABNORMAL HIGH (ref 0–35)
AST: 26 U/L (ref 0–37)
BILIRUBIN DIRECT: 0.1 mg/dL (ref 0.0–0.3)
Total Bilirubin: 0.7 mg/dL (ref 0.2–1.2)
Total Protein: 6.8 g/dL (ref 6.0–8.3)

## 2016-12-29 ENCOUNTER — Telehealth: Payer: Self-pay | Admitting: Family Medicine

## 2016-12-29 NOTE — Telephone Encounter (Signed)
Ok for her to resume her normal habits- no need to hold tylenol, alcohol going forward.  Just wanted to make sure the levels weren't climbing

## 2016-12-29 NOTE — Telephone Encounter (Signed)
Patient is aware of notes below. Stated verbal understanding.

## 2016-12-29 NOTE — Telephone Encounter (Signed)
Copied from [facility] #[id number]. Topic: General - Other >> Dec 29, 2016  9:25 AM Yvette Rack wrote: Reason for CRM: patient wanting Dr Birdie Riddle to give her a call to go over lab results

## 2016-12-29 NOTE — Telephone Encounter (Signed)
Patient is questioning her results.   It says that levels were better, no changes.    She is questioning if this still means no tylenol, aspirin  or alcohol.    If so, she is asking what she can take for a headache.    She was confused as to if she is okay to return to her normal routine or hold products.   She is aware that I am forwarding to Dr. Birdie Riddle for clarification and that I will call her back when I have an answer.

## 2017-03-12 LAB — HM MAMMOGRAPHY

## 2017-03-27 ENCOUNTER — Encounter: Payer: Self-pay | Admitting: General Practice

## 2017-06-01 ENCOUNTER — Other Ambulatory Visit: Payer: Self-pay | Admitting: Family Medicine

## 2017-06-18 ENCOUNTER — Ambulatory Visit: Payer: BC Managed Care – PPO | Admitting: Family Medicine

## 2017-06-25 ENCOUNTER — Other Ambulatory Visit: Payer: Self-pay

## 2017-06-25 ENCOUNTER — Encounter: Payer: Self-pay | Admitting: Family Medicine

## 2017-06-25 ENCOUNTER — Encounter: Payer: Self-pay | Admitting: General Practice

## 2017-06-25 ENCOUNTER — Ambulatory Visit: Payer: BC Managed Care – PPO | Admitting: Family Medicine

## 2017-06-25 VITALS — BP 120/82 | HR 52 | Temp 98.1°F | Resp 16 | Ht 64.0 in | Wt 119.1 lb

## 2017-06-25 DIAGNOSIS — Z0184 Encounter for antibody response examination: Secondary | ICD-10-CM | POA: Diagnosis not present

## 2017-06-25 DIAGNOSIS — E785 Hyperlipidemia, unspecified: Secondary | ICD-10-CM | POA: Diagnosis not present

## 2017-06-25 LAB — CBC WITH DIFFERENTIAL/PLATELET
Basophils Absolute: 0 10*3/uL (ref 0.0–0.1)
Basophils Relative: 0.6 % (ref 0.0–3.0)
Eosinophils Absolute: 0.2 10*3/uL (ref 0.0–0.7)
Eosinophils Relative: 4.4 % (ref 0.0–5.0)
HCT: 41.4 % (ref 36.0–46.0)
Hemoglobin: 14 g/dL (ref 12.0–15.0)
LYMPHS ABS: 1.7 10*3/uL (ref 0.7–4.0)
Lymphocytes Relative: 33.5 % (ref 12.0–46.0)
MCHC: 33.9 g/dL (ref 30.0–36.0)
MCV: 93.9 fl (ref 78.0–100.0)
MONO ABS: 0.2 10*3/uL (ref 0.1–1.0)
Monocytes Relative: 4.6 % (ref 3.0–12.0)
NEUTROS PCT: 56.9 % (ref 43.0–77.0)
Neutro Abs: 3 10*3/uL (ref 1.4–7.7)
Platelets: 203 10*3/uL (ref 150.0–400.0)
RBC: 4.41 Mil/uL (ref 3.87–5.11)
RDW: 13 % (ref 11.5–15.5)
WBC: 5.2 10*3/uL (ref 4.0–10.5)

## 2017-06-25 LAB — HEPATIC FUNCTION PANEL
ALBUMIN: 4.7 g/dL (ref 3.5–5.2)
ALK PHOS: 42 U/L (ref 39–117)
ALT: 27 U/L (ref 0–35)
AST: 20 U/L (ref 0–37)
Bilirubin, Direct: 0.1 mg/dL (ref 0.0–0.3)
TOTAL PROTEIN: 7.1 g/dL (ref 6.0–8.3)
Total Bilirubin: 0.7 mg/dL (ref 0.2–1.2)

## 2017-06-25 LAB — BASIC METABOLIC PANEL
BUN: 18 mg/dL (ref 6–23)
CO2: 31 mEq/L (ref 19–32)
Calcium: 9.5 mg/dL (ref 8.4–10.5)
Chloride: 105 mEq/L (ref 96–112)
Creatinine, Ser: 0.71 mg/dL (ref 0.40–1.20)
GFR: 88.56 mL/min (ref >60.00)
GLUCOSE: 90 mg/dL (ref 70–99)
POTASSIUM: 4.4 meq/L (ref 3.5–5.1)
SODIUM: 142 meq/L (ref 135–145)

## 2017-06-25 LAB — LIPID PANEL
CHOLESTEROL: 178 mg/dL (ref 0–200)
HDL: 70.8 mg/dL (ref >39.00)
LDL Cholesterol: 92 mg/dL (ref 0–99)
NonHDL: 107.26
Total CHOL/HDL Ratio: 3
Triglycerides: 78 mg/dL (ref 0.0–149.0)
VLDL: 15.6 mg/dL (ref 0.0–40.0)

## 2017-06-25 NOTE — Progress Notes (Signed)
   Subjective:    Patient ID: Dominique Harrell, female    DOB: May 28, 1955, 62 y.o.   MRN: [id number]  HPI Hyperlipidemia- chronic problem, on Simvastatin '40mg'$  daily.  Denies CP, SOB, abd pain, N/V.  Limited exercise.  Pt needs MMR titers due to new grandson   Review of Systems For ROS see HPI     Objective:   Physical Exam  Constitutional: She is oriented to person, place, and time. She appears well-developed and well-nourished. No distress.  HENT:  Head: Normocephalic and atraumatic.  Eyes: Pupils are equal, round, and reactive to light. Conjunctivae and EOM are normal.  Neck: Normal range of motion. Neck supple. No thyromegaly present.  Cardiovascular: Normal rate, regular rhythm, normal heart sounds and intact distal pulses.  No murmur heard. Pulmonary/Chest: Effort normal and breath sounds normal. No respiratory distress.  Abdominal: Soft. She exhibits no distension. There is no tenderness.  Musculoskeletal: She exhibits no edema.  Lymphadenopathy:    She has no cervical adenopathy.  Neurological: She is alert and oriented to person, place, and time.  Skin: Skin is warm and dry.  Psychiatric: She has a normal mood and affect. Her behavior is normal.  Vitals reviewed.         Assessment & Plan:

## 2017-06-25 NOTE — Assessment & Plan Note (Signed)
Chronic problem.  Tolerating statin w/o difficulty.  Encouraged her to resume her regular exercise.  Check labs.  Adjust meds prn

## 2017-06-25 NOTE — Patient Instructions (Signed)
Schedule your complete physical in 6 months We'll notify you of your lab results and make any changes if needed Continue to work on healthy diet and regular exercise- you look great! Call with any questions or concerns Have a great summer!!  

## 2017-06-26 LAB — MEASLES/MUMPS/RUBELLA IMMUNITY
Mumps IgG: >300 AU/mL
Rubella: 6.53 index

## 2017-06-27 ENCOUNTER — Encounter: Payer: Self-pay | Admitting: General Practice

## 2017-07-06 ENCOUNTER — Encounter: Payer: Self-pay | Admitting: Gastroenterology

## 2017-08-14 LAB — HM DEXA SCAN

## 2017-08-28 ENCOUNTER — Encounter: Payer: Self-pay | Admitting: General Practice

## 2017-09-09 ENCOUNTER — Other Ambulatory Visit: Payer: Self-pay | Admitting: Family Medicine

## 2017-10-11 ENCOUNTER — Encounter: Payer: Self-pay | Admitting: Family Medicine

## 2017-10-11 ENCOUNTER — Other Ambulatory Visit: Payer: Self-pay

## 2017-10-11 ENCOUNTER — Ambulatory Visit: Payer: BC Managed Care – PPO | Admitting: Family Medicine

## 2017-10-11 VITALS — BP 112/68 | HR 80 | Temp 98.3°F | Ht 64.0 in | Wt 122.0 lb

## 2017-10-11 DIAGNOSIS — J301 Allergic rhinitis due to pollen: Secondary | ICD-10-CM

## 2017-10-11 NOTE — Patient Instructions (Signed)
Please follow up if symptoms do not improve or as needed.   Start flonase daily again. Add allegra or zyrtec or claritin if needed.  Advil can help with the pain.

## 2017-10-11 NOTE — Progress Notes (Signed)
Subjective  CC:  Chief Complaint  Patient presents with  . Ear Pain    x 2 days  . Cough    mucus running down back of throat     HPI: Dominique Harrell is a 62 y.o. female who presents to the office today to address the problems listed above in the chief complaint.  C/o st x 2 days with pnd and left ear pressure with associated sneezing. Had allergy sxs like this last fall. No f/c/s. 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   Assessment  1. Seasonal allergic rhinitis due to pollen      Plan   AR:  Reassured. flonase and oral antihistamine. advil if needed.   Pt elects returning later for flu shot.   Follow up: prn   No orders of the defined types were placed in this encounter.  No orders of the defined types were placed in this encounter.     I reviewed the patients updated PMH, FH, and SocHx.    Patient Active Problem List   Diagnosis Date Noted  . Lumbar back pain with radiculopathy affecting right lower extremity 04/07/2015  . 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 General medical examination 03/02/2011  . SHOULDER IMPINGEMENT SYNDROME, LEFT 02/15/2010  . NEOPLASM OF UNCERTAIN BEHAVIOR OF SKIN 12/02/2009  . Hyperlipidemia 06/08/2006  . DEPRESSION 06/08/2006  . MIGRAINE HEADACHE 06/08/2006  . INSOMNIA 06/08/2006   Current Meds  Medication Sig  . alendronate (FOSAMAX) 70 MG tablet TAKE 1 TABLET BY MOUTH EVERY 7 DAYS WITH FULL GLASS OF WATER AND ON AN EMPTY STOMACH  . aspirin 81 MG tablet Take 81 mg by mouth daily.    . Calcium Carbonate-Vit D-Min 600-400 MG-UNIT TABS Take 1 tablet by mouth 2 (two) times daily.   . Omega-3 Fatty Acids (FISH OIL CONCENTRATE PO) Take 1 tablet by mouth daily.    . simvastatin (ZOCOR) 40 MG tablet TAKE 1 TABLET(40 MG) BY MOUTH AT BEDTIME    Allergies: Patient is allergic to penicillins and tramadol. Family History: Patient family history includes Diabetes in her  mother; Hyperlipidemia in her other; Hypertension in her other; Parkinsonism in her father. Social History:  Patient  reports that she has never smoked. She has never used smokeless tobacco.  Review of Systems: Constitutional: Negative for fever malaise or anorexia Cardiovascular: negative for chest pain Respiratory: negative for SOB or persistent cough Gastrointestinal: negative for abdominal pain  Objective  Vitals: BP 112/68   Pulse 80   Temp 98.3 F (36.8 C)   Ht 5\' 4"  (1.626 m)   Wt 122 lb (55.3 kg)   SpO2 98%   BMI 20.94 kg/m  General: no acute distress , A&Ox3 HEENT: PEERL, conjunctiva normal, Oropharynx moist,neck is supple, serous effusions bilateral TMs, posterior pharynx is red w/o exudate, no LAD Cardiovascular:  RRR without murmur or gallop.  Respiratory:  Good breath sounds bilaterally, CTAB with normal respiratory effort Skin:  Warm, no rashes     Commons side effects, risks, benefits, and alternatives for medications and treatment plan prescribed today were discussed, and the patient expressed understanding of the given instructions. Patient is instructed to call or message via MyChart if he/she has any questions or concerns regarding our treatment plan. No barriers to understanding were identified. We discussed Red Flag symptoms and signs in detail. Patient expressed understanding regarding what to do in case of urgent or emergency type symptoms.  Medication list was reconciled, printed and provided to the patient in AVS. Patient instructions and summary information was reviewed with the patient as documented in the AVS. This note was prepared with assistance of Dragon voice recognition software. Occasional wrong-word or sound-a-like substitutions may have occurred due to the inherent limitations of voice recognition software

## 2017-10-16 ENCOUNTER — Other Ambulatory Visit: Payer: Self-pay | Admitting: Family Medicine

## 2017-10-16 ENCOUNTER — Encounter: Payer: Self-pay | Admitting: Family Medicine

## 2017-10-16 NOTE — Telephone Encounter (Signed)
Last OV 10/11/17, Next OV 12/24/17  Last filled 11/06/16, # 12 with 3 refills

## 2017-10-17 ENCOUNTER — Ambulatory Visit: Payer: BC Managed Care – PPO | Admitting: Family Medicine

## 2017-10-17 ENCOUNTER — Encounter: Payer: Self-pay | Admitting: Family Medicine

## 2017-10-17 ENCOUNTER — Other Ambulatory Visit: Payer: Self-pay

## 2017-10-17 VITALS — BP 110/72 | HR 66 | Temp 98.6°F | Resp 17 | Ht 64.0 in | Wt 121.1 lb

## 2017-10-17 DIAGNOSIS — T63481A Toxic effect of venom of other arthropod, accidental (unintentional), initial encounter: Secondary | ICD-10-CM

## 2017-10-17 DIAGNOSIS — L03113 Cellulitis of right upper limb: Secondary | ICD-10-CM

## 2017-10-17 MED ORDER — CEPHALEXIN 500 MG PO CAPS: 500.0000 mg | ORAL_CAPSULE | Freq: Three times a day (TID) | ORAL | 0 refills | Status: AC

## 2017-10-17 NOTE — Progress Notes (Signed)
   Subjective:    Patient ID: Dominique Harrell, female    DOB: [birth year], 62 y.o.   MRN: [id number]  HPI Bee sting- occurred Monday, R forearm.  Monday/Tuesday was itchy, red, warm, swollen and TTP.  Yesterday redness was extending away from central sting and there was a streaking line- this has since resolved but redness remains.  Area is less tender today but still red and warm.   Review of Systems For ROS see HPI     Objective:   Physical Exam  Constitutional: She appears well-developed and well-nourished. No distress.  HENT:  Head: Normocephalic and atraumatic.  Cardiovascular: Intact distal pulses.  Skin: Skin is warm and dry. There is erythema (central sting w/ marked surrounding redness of R forearm, mild TTP, no drainage).  Vitals reviewed.         Assessment & Plan:  Cellulitis- new.  Pt was stung by insect on Monday and subsequently developed large, red, painful, and itchy area consistent w/ cellulitis.  Start Keflex- reviewed possibility of cross reactivity w/ PCN and pt is aware.  Reviewed supportive care and red flags that should prompt return.  Pt expressed understanding and is in agreement w/ plan.

## 2017-10-17 NOTE — Patient Instructions (Signed)
Follow up as needed or as scheduled Start the Cephalexin 3x/day w/ food to improve infected area Continue to ice! If you develop any additional itching, rash, or other concerns- stop immediately and let me know! Call with any questions or concerns Hang in there!

## 2017-12-06 ENCOUNTER — Other Ambulatory Visit: Payer: Self-pay | Admitting: Family Medicine

## 2017-12-15 ENCOUNTER — Other Ambulatory Visit: Payer: Self-pay | Admitting: Family Medicine

## 2017-12-24 ENCOUNTER — Ambulatory Visit (INDEPENDENT_AMBULATORY_CARE_PROVIDER_SITE_OTHER): Payer: BC Managed Care – PPO | Admitting: Family Medicine

## 2017-12-24 ENCOUNTER — Encounter: Payer: Self-pay | Admitting: Family Medicine

## 2017-12-24 ENCOUNTER — Other Ambulatory Visit: Payer: Self-pay

## 2017-12-24 VITALS — BP 106/76 | HR 76 | Temp 98.0°F | Resp 16 | Ht 64.0 in | Wt 121.1 lb

## 2017-12-24 DIAGNOSIS — M810 Age-related osteoporosis without current pathological fracture: Secondary | ICD-10-CM

## 2017-12-24 DIAGNOSIS — E785 Hyperlipidemia, unspecified: Secondary | ICD-10-CM | POA: Diagnosis not present

## 2017-12-24 DIAGNOSIS — Z23 Encounter for immunization: Secondary | ICD-10-CM

## 2017-12-24 DIAGNOSIS — Z0000 Encounter for general adult medical examination without abnormal findings: Secondary | ICD-10-CM | POA: Diagnosis not present

## 2017-12-24 LAB — VITAMIN D 25 HYDROXY (VIT D DEFICIENCY, FRACTURES): VITD: 46.65 ng/mL (ref 30.00–100.00)

## 2017-12-24 LAB — LIPID PANEL
CHOLESTEROL: 160 mg/dL (ref 0–200)
HDL: 61.3 mg/dL (ref >39.00)
LDL CALC: 83 mg/dL (ref 0–99)
NonHDL: 99.08
TRIGLYCERIDES: 81 mg/dL (ref 0.0–149.0)
Total CHOL/HDL Ratio: 3
VLDL: 16.2 mg/dL (ref 0.0–40.0)

## 2017-12-24 LAB — BASIC METABOLIC PANEL
BUN: 19 mg/dL (ref 6–23)
CO2: 30 mEq/L (ref 19–32)
Calcium: 9.9 mg/dL (ref 8.4–10.5)
Chloride: 105 mEq/L (ref 96–112)
Creatinine, Ser: 0.76 mg/dL (ref 0.40–1.20)
GFR: 81.74 mL/min (ref >60.00)
GLUCOSE: 100 mg/dL — AB (ref 70–99)
POTASSIUM: 4.3 meq/L (ref 3.5–5.1)
Sodium: 143 mEq/L (ref 135–145)

## 2017-12-24 LAB — HEPATIC FUNCTION PANEL
ALBUMIN: 4.8 g/dL (ref 3.5–5.2)
ALK PHOS: 44 U/L (ref 39–117)
ALT: 51 U/L — ABNORMAL HIGH (ref 0–35)
AST: 34 U/L (ref 0–37)
Bilirubin, Direct: 0.1 mg/dL (ref 0.0–0.3)
Total Bilirubin: 0.7 mg/dL (ref 0.2–1.2)
Total Protein: 7.4 g/dL (ref 6.0–8.3)

## 2017-12-24 LAB — CBC WITH DIFFERENTIAL/PLATELET
Basophils Absolute: 0 10*3/uL (ref 0.0–0.1)
Basophils Relative: 0.5 % (ref 0.0–3.0)
EOS ABS: 0.2 10*3/uL (ref 0.0–0.7)
Eosinophils Relative: 4.3 % (ref 0.0–5.0)
HCT: 42 % (ref 36.0–46.0)
Hemoglobin: 14.4 g/dL (ref 12.0–15.0)
LYMPHS ABS: 1.7 10*3/uL (ref 0.7–4.0)
Lymphocytes Relative: 31.8 % (ref 12.0–46.0)
MCHC: 34.2 g/dL (ref 30.0–36.0)
MCV: 94.2 fl (ref 78.0–100.0)
MONO ABS: 0.2 10*3/uL (ref 0.1–1.0)
MONOS PCT: 4.6 % (ref 3.0–12.0)
NEUTROS ABS: 3.1 10*3/uL (ref 1.4–7.7)
NEUTROS PCT: 58.8 % (ref 43.0–77.0)
PLATELETS: 204 10*3/uL (ref 150.0–400.0)
RBC: 4.46 Mil/uL (ref 3.87–5.11)
RDW: 12.6 % (ref 11.5–15.5)
WBC: 5.3 10*3/uL (ref 4.0–10.5)

## 2017-12-24 LAB — TSH: TSH: 1.95 u[IU]/mL (ref 0.35–4.50)

## 2017-12-24 NOTE — Patient Instructions (Signed)
Follow up in 6 months to recheck cholesterol We'll notify you of your lab results and make any changes if needed Keep up the good work on healthy diet and regular exercise- you look great! Call with any questions or concerns Happy Holidays!!!

## 2017-12-24 NOTE — Assessment & Plan Note (Signed)
Pt's PE WNL.  UTD on GYN.  Plans to schedule colonoscopy.  Shingrix given today.  Check labs.  Anticipatory guidance provided.

## 2017-12-24 NOTE — Addendum Note (Signed)
Addended by: Davis Gourd on: 12/24/2017 09:05 AM   Modules accepted: Orders

## 2017-12-24 NOTE — Progress Notes (Signed)
   Subjective:    Patient ID: Dominique Harrell, female    DOB: June 09, 1955, 62 y.o.   MRN: [id number]  HPI CPE- UTD on pap, mammo.  Due for colonoscopy- got recall letter, needs to schedule.  Asking for Shingrix today.   Review of Systems Patient reports no vision/ hearing changes, adenopathy,fever, weight change,  persistant/recurrent hoarseness , swallowing issues, chest pain, palpitations, edema, persistant/recurrent cough, hemoptysis, dyspnea (rest/exertional/paroxysmal nocturnal), gastrointestinal bleeding (melena, rectal bleeding), abdominal pain, significant heartburn, bowel changes, GU symptoms (dysuria, hematuria, incontinence), Gyn symptoms (abnormal  bleeding, pain),  syncope, focal weakness, memory loss, numbness & tingling, skin/hair/nail changes, abnormal bruising or bleeding, anxiety, or depression.     Objective:   Physical Exam General Appearance:    Alert, cooperative, no distress, appears stated age  Head:    Normocephalic, without obvious abnormality, atraumatic  Eyes:    PERRL, conjunctiva/corneas clear, EOM's intact, fundi    benign, both eyes  Ears:    Normal TM's and external ear canals, both ears  Nose:   Nares normal, septum midline, mucosa normal, no drainage    or sinus tenderness  Throat:   Lips, mucosa, and tongue normal; teeth and gums normal  Neck:   Supple, symmetrical, trachea midline, no adenopathy;    Thyroid: no enlargement/tenderness/nodules  Back:     Symmetric, no curvature, ROM normal, no CVA tenderness  Lungs:     Clear to auscultation bilaterally, respirations unlabored  Chest Wall:    No tenderness or deformity   Heart:    Regular rate and rhythm, S1 and S2 normal, no murmur, rub   or gallop  Breast Exam:    Deferred to GYN  Abdomen:     Soft, non-tender, bowel sounds active all four quadrants,    no masses, no organomegaly  Genitalia:    Deferred to GYN  Rectal:    Extremities:   Extremities normal, atraumatic, no cyanosis or edema    Pulses:   2+ and symmetric all extremities  Skin:   Skin color, texture, turgor normal, no rashes or lesions  Lymph nodes:   Cervical, supraclavicular, and axillary nodes normal  Neurologic:   CNII-XII intact, normal strength, sensation and reflexes    throughout          Assessment & Plan:

## 2017-12-24 NOTE — Assessment & Plan Note (Signed)
Ongoing issue for pt.  On year 4 of 5 of Fosamax.  UTD on DEXA.  Will check Vit D level and replete prn.

## 2017-12-24 NOTE — Assessment & Plan Note (Signed)
Chronic problem.  Tolerating statin w/o difficulty.  Check labs.  Adjust meds prn  

## 2017-12-25 ENCOUNTER — Encounter: Payer: Self-pay | Admitting: General Practice

## 2018-01-15 ENCOUNTER — Encounter: Payer: Self-pay | Admitting: Gastroenterology

## 2018-02-15 ENCOUNTER — Encounter: Payer: Self-pay | Admitting: Family Medicine

## 2018-02-15 ENCOUNTER — Other Ambulatory Visit: Payer: Self-pay

## 2018-02-15 ENCOUNTER — Ambulatory Visit (HOSPITAL_BASED_OUTPATIENT_CLINIC_OR_DEPARTMENT_OTHER)
Admission: RE | Admit: 2018-02-15 | Discharge: 2018-02-15 | Disposition: A | Discharge status: Home / Self Care | Payer: BC Managed Care – PPO | Source: Ambulatory Visit | Attending: Family Medicine | Admitting: Family Medicine

## 2018-02-15 ENCOUNTER — Ambulatory Visit: Payer: BC Managed Care – PPO | Admitting: Family Medicine

## 2018-02-15 VITALS — BP 118/70 | HR 68 | Temp 98.1°F | Resp 16 | Ht 63.0 in | Wt 120.4 lb

## 2018-02-15 DIAGNOSIS — R200 Anesthesia of skin: Secondary | ICD-10-CM | POA: Diagnosis not present

## 2018-02-15 DIAGNOSIS — M7062 Trochanteric bursitis, left hip: Secondary | ICD-10-CM | POA: Diagnosis not present

## 2018-02-15 DIAGNOSIS — M25462 Effusion, left knee: Secondary | ICD-10-CM | POA: Diagnosis present

## 2018-02-15 NOTE — Patient Instructions (Signed)
Follow up as needed or as scheduled We'll call you with your ultrasound appt The lateral pain is bursitis and will improve w/ ice and ibuprofen/aleve The numbness was positional Drink lots of fluids Call with any questions or concerns Hang in there! Happy New Year!!!

## 2018-02-15 NOTE — Progress Notes (Signed)
   Subjective:    Patient ID: Dominique Harrell Seen, female    DOB: 07-03-1955, 63 y.o.   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  2 nights ago was sitting at the table for >1 hr and when she stood up, leg was numb.  Wilburn Mylar 'something didn't feel right'.  Pt has hx of tumor of L hamstring.  Some swelling behind L knee.  'this leg doesn't feel the same as this one'- which is a change.   Review of Systems For ROS see HPI     Objective:   Physical Exam Vitals signs reviewed.  Constitutional:      General: She is not in acute distress.    Appearance: Normal appearance. She is normal weight.  Cardiovascular:     Pulses: Normal pulses.  Musculoskeletal:        General: Swelling (mild swelling behind L knee) and tenderness (TTP over L trochanteric bursa) present.  Skin:    General: Skin is warm and dry.     Findings: No lesion or rash.  Neurological:     General: No focal deficit present.     Mental Status: She is alert.     Sensory: No sensory deficit.     Motor: No weakness.     Coordination: Coordination normal.     Gait: Gait normal.     Deep Tendon Reflexes: Reflexes normal.           Assessment & Plan:  Swelling behind L knee- new.  Suspect this is Baker's cyst but given pt's recent hx of liposarcoma will get [place] to assess.  Pt expressed understanding and is in agreement w/ plan.   Trochanteric bursitis- new.  Pt is point tender- consistent w/ dx.  She has been doing a lot of squatting, bending, crawling due to visit w/ grandson.  Start NSAIDs, ice.  Pt expressed understanding and is in agreement w/ plan.   L leg numbness- resolved.  Discussed positional nerve irritation.  Pt feels better about this.  She is to let me know if starts occurring regularly

## 2018-02-25 ENCOUNTER — Encounter: Payer: Self-pay | Admitting: Gastroenterology

## 2018-02-25 ENCOUNTER — Ambulatory Visit (AMBULATORY_SURGERY_CENTER): Payer: BC Managed Care – PPO | Admitting: *Deleted

## 2018-02-25 VITALS — Ht 63.5 in | Wt 121.0 lb

## 2018-02-25 DIAGNOSIS — Z1211 Encounter for screening for malignant neoplasm of colon: Secondary | ICD-10-CM

## 2018-02-25 MED ORDER — NA SULFATE-K SULFATE-MG SULF 17.5-3.13-1.6 GM/177ML PO SOLN: ORAL | 0 refills | Status: DC

## 2018-02-25 NOTE — Progress Notes (Signed)
Patient denies any allergies to eggs or soy. Patient denies any problems with anesthesia/sedation. Patient did have BP drop after surgery in 2017. Patient denies any oxygen use at home. Patient denies taking any diet/weight loss medications or blood thinners. EMMI education offered pt declined. Suprep coupon printed and given to pt.

## 2018-03-05 ENCOUNTER — Encounter: Payer: Self-pay | Admitting: Gastroenterology

## 2018-03-05 ENCOUNTER — Ambulatory Visit (AMBULATORY_SURGERY_CENTER): Payer: BC Managed Care – PPO | Admitting: Gastroenterology

## 2018-03-05 VITALS — BP 99/58 | HR 54 | Temp 98.4°F | Resp 13 | Ht 63.5 in | Wt 121.0 lb

## 2018-03-05 DIAGNOSIS — Z1211 Encounter for screening for malignant neoplasm of colon: Secondary | ICD-10-CM | POA: Diagnosis not present

## 2018-03-05 MED ORDER — SODIUM CHLORIDE 0.9 % IV SOLN: 500.0000 mL | Freq: Once | INTRAVENOUS | Status: DC

## 2018-03-05 NOTE — Op Note (Signed)
[facility] Patient Name: Dominique Harrell Procedure Date: 03/05/2018 8:36 AM MRN: [id number] Endoscopist: Ladene Artist , MD Age: 63 Referring MD:  Date of Birth: September 29, 1955 Gender: Female Account #: [id number] Procedure:                Colonoscopy Indications:              Screening for colorectal malignant neoplasm Medicines:                Monitored Anesthesia Care Procedure:                Pre-Anesthesia Assessment:                           - Prior to the procedure, a History and Physical                            was performed, and patient medications and                            allergies were reviewed. The patient's tolerance of                            previous anesthesia was also reviewed. The risks                            and benefits of the procedure and the sedation                            options and risks were discussed with the patient.                            All questions were answered, and informed consent                            was obtained. Prior Anticoagulants: The patient has                            taken no previous anticoagulant or antiplatelet                            agents. ASA Grade Assessment: II - A patient with                            mild systemic disease. After reviewing the risks                            and benefits, the patient was deemed in                            satisfactory condition to undergo the procedure.                           After obtaining informed consent, the colonoscope  was passed under direct vision. Throughout the                            procedure, the patient's blood pressure, pulse, and                            oxygen saturations were monitored continuously. The                            Model PCF-H190DL [id number]) scope was introduced                            through the anus and advanced to the the cecum,                            identified by  appendiceal orifice and ileocecal                            valve. The ileocecal valve, appendiceal orifice,                            and rectum were photographed. The quality of the                            bowel preparation was excellent. The colonoscopy                            was performed without difficulty. The patient                            tolerated the procedure well. Scope In: 8:36:00 AM Scope Out: 8:52:39 AM Scope Withdrawal Time: 0 hours 11 minutes 50 seconds  Total Procedure Duration: 0 hours 16 minutes 39 seconds  Findings:                 The perianal and digital rectal examinations were                            normal.                           A few small-mouthed diverticula were found in the                            sigmoid colon.                           Internal hemorrhoids were found during                            retroflexion. The hemorrhoids were small and Grade                            I (internal hemorrhoids that do not prolapse).  The exam was otherwise without abnormality on                            direct and retroflexion views. Complications:            No immediate complications. Estimated blood loss:                            None. Estimated Blood Loss:     Estimated blood loss: none. Impression:               - Diverticulosis in the sigmoid colon.                           - Internal hemorrhoids.                           - The examination was otherwise normal on direct                            and retroflexion views.                           - No specimens collected. Recommendation:           - Repeat colonoscopy in 10 years for screening                            purposes.                           - Patient has a contact number available for                            emergencies. The signs and symptoms of potential                            delayed complications were discussed with the                             patient. Return to normal activities tomorrow.                            Written discharge instructions were provided to the                            patient.                           - High fiber diet.                           - Continue present medications. Ladene Artist, MD 03/05/2018 8:55:17 AM This report has been signed electronically.

## 2018-03-05 NOTE — Patient Instructions (Signed)
Handouts given for diverticulosis, hemorrhoids and high fiber diet.  YOU HAD AN ENDOSCOPIC PROCEDURE TODAY AT [facility]:   Refer to the procedure report that was given to you for any specific questions about what was found during the examination.  If the procedure report does not answer your questions, please call your gastroenterologist to clarify.  If you requested that your care partner not be given the details of your procedure findings, then the procedure report has been included in a sealed envelope for you to review at your convenience later.  YOU SHOULD EXPECT: Some feelings of bloating in the abdomen. Passage of more gas than usual.  Walking can help get rid of the air that was put into your GI tract during the procedure and reduce the bloating. If you had a lower endoscopy (such as a colonoscopy or flexible sigmoidoscopy) you may notice spotting of blood in your stool or on the toilet paper. If you underwent a bowel prep for your procedure, you may not have a normal bowel movement for a few days.  Please Note:  You might notice some irritation and congestion in your nose or some drainage.  This is from the oxygen used during your procedure.  There is no need for concern and it should clear up in a day or so.  SYMPTOMS TO REPORT IMMEDIATELY:   Following lower endoscopy (colonoscopy or flexible sigmoidoscopy):  Excessive amounts of blood in the stool  Significant tenderness or worsening of abdominal pains  Swelling of the abdomen that is new, acute  Fever of 100F or higher  For urgent or emergent issues, a gastroenterologist can be reached at any hour by calling [phone].   DIET:  We do recommend a small meal at first, but then you may proceed to your regular diet.  Drink plenty of fluids but you should avoid alcoholic beverages for 24 hours.  ACTIVITY:  You should plan to take it easy for the rest of today and you should NOT DRIVE or use heavy machinery until  tomorrow (because of the sedation medicines used during the test).    FOLLOW UP: Our staff will call the number listed on your records the next business day following your procedure to check on you and address any questions or concerns that you may have regarding the information given to you following your procedure. If we do not reach you, we will leave a message.  However, if you are feeling well and you are not experiencing any problems, there is no need to return our call.  We will assume that you have returned to your regular daily activities without incident.  If any biopsies were taken you will be contacted by phone or by letter within the next 1-3 weeks.  Please call us at [phone] if you have not heard about the biopsies in 3 weeks.    SIGNATURES/CONFIDENTIALITY: You and/or your care partner have signed paperwork which will be entered into your electronic medical record.  These signatures attest to the fact that that the information above on your After Visit Summary has been reviewed and is understood.  Full responsibility of the confidentiality of this discharge information lies with you and/or your care-partner.

## 2018-03-05 NOTE — Progress Notes (Signed)
Report given to PACU, vss 

## 2018-03-06 ENCOUNTER — Telehealth: Payer: Self-pay | Admitting: *Deleted

## 2018-03-06 NOTE — Telephone Encounter (Signed)
Second attempt, left VM.  

## 2018-03-06 NOTE — Telephone Encounter (Signed)
First attempt, left VM.  

## 2018-03-11 ENCOUNTER — Other Ambulatory Visit: Payer: Self-pay | Admitting: Family Medicine

## 2018-03-11 ENCOUNTER — Encounter: Payer: BC Managed Care – PPO | Admitting: Gastroenterology

## 2018-03-18 LAB — HM MAMMOGRAPHY

## 2018-03-26 ENCOUNTER — Encounter: Payer: Self-pay | Admitting: General Practice

## 2018-06-05 ENCOUNTER — Encounter: Payer: Self-pay | Admitting: Family Medicine

## 2018-06-05 ENCOUNTER — Other Ambulatory Visit: Payer: Self-pay | Admitting: Family Medicine

## 2018-06-10 ENCOUNTER — Other Ambulatory Visit: Payer: Self-pay

## 2018-06-10 ENCOUNTER — Encounter: Payer: Self-pay | Admitting: Family Medicine

## 2018-06-10 ENCOUNTER — Ambulatory Visit (INDEPENDENT_AMBULATORY_CARE_PROVIDER_SITE_OTHER): Payer: BC Managed Care – PPO | Admitting: Family Medicine

## 2018-06-10 VITALS — BP 112/67 | Temp 97.6°F | Ht 63.5 in | Wt 122.0 lb

## 2018-06-10 DIAGNOSIS — M5416 Radiculopathy, lumbar region: Secondary | ICD-10-CM

## 2018-06-10 MED ORDER — CYCLOBENZAPRINE HCL 10 MG PO TABS: 10.0000 mg | ORAL_TABLET | Freq: Three times a day (TID) | ORAL | 0 refills | Status: DC | PRN

## 2018-06-10 MED ORDER — PREDNISONE 10 MG PO TABS: ORAL_TABLET | ORAL | 0 refills | Status: DC

## 2018-06-10 NOTE — Progress Notes (Signed)
Virtual Visit via Video   I connected with patient on 06/10/18 at 10:20 AM EDT by a video enabled telemedicine application and verified that I am speaking with the correct person using two identifiers.  Location patient: Home Location provider: [facility] Persons participating in the virtual visit: Patient, Provider, Dominique ([place])  I discussed the limitations of evaluation and management by telemedicine and the availability of in person appointments. The patient expressed understanding and agreed to proceed.  Subjective:   HPI:   Back pain- pt reports 3 weeks ago her 'back went out' on L side but sxs subsided after ~4 days.  It happened again ~1 week ago on R side and is radiating down into her thigh.  Pain across lower back.  Pt has hx of this.  Difficulty getting up and down.  Unable to walk for exercise at this time.  No bowel or bladder incontinence.  No numbness.  ROS:   See pertinent positives and negatives per HPI.  Patient Active Problem List   Diagnosis Date Noted  . Lumbar back pain with radiculopathy affecting right lower extremity 04/07/2015  . 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 General medical examination 03/02/2011  . SHOULDER IMPINGEMENT SYNDROME, LEFT 02/15/2010  . NEOPLASM OF UNCERTAIN BEHAVIOR OF SKIN 12/02/2009  . Hyperlipidemia 06/08/2006  . DEPRESSION 06/08/2006  . MIGRAINE HEADACHE 06/08/2006  . INSOMNIA 06/08/2006    Social History   Tobacco Use  . Smoking status: Never Smoker  . Smokeless tobacco: Never Used  Substance Use Topics  . Alcohol use: Yes    Comment: occ.    Current Outpatient Medications:  .  alendronate (FOSAMAX) 70 MG tablet, TAKE 1 TABLET BY MOUTH EVERY 7 DAYS WITH A FULL GLASS OF WATER AND ON EMPTY STOMACH, Disp: 12 tablet, Rfl: 0 .  aspirin 81 MG tablet, Take 81 mg by mouth daily.  , Disp: , Rfl:  .  Calcium Carbonate-Vit D-Min 600-400 MG-UNIT TABS, Take 1 tablet by mouth 2 (two) times daily. ,  Disp: , Rfl:  .  Omega-3 Fatty Acids (FISH OIL CONCENTRATE PO), Take 1 tablet by mouth daily.  , Disp: , Rfl:  .  simvastatin (ZOCOR) 40 MG tablet, TAKE 1 TABLET(40 MG) BY MOUTH AT BEDTIME, Disp: 90 tablet, Rfl: 0 .  fluticasone (FLONASE) 50 MCG/ACT nasal spray, SHAKE LIQUID AND USE 2 SPRAYS IN EACH NOSTRIL DAILY (Patient not taking: Reported on 02/25/2018), Disp: 16 g, Rfl: 3 .  Na Sulfate-K Sulfate-Mg Sulf 17.5-3.13-1.6 GM/177ML SOLN, Suprep (no substitutions)-TAKE AS DIRECTED., Disp: 354 mL, Rfl: 0  Allergies  Allergen Reactions  . Penicillins Rash  . Tramadol Other (See Comments)    Caused a migraine     Objective:   BP 112/67   Temp 97.6 F (36.4 C)   Ht 5' 3.5" (1.613 m)   Wt 122 lb (55.3 kg)   BMI 21.27 kg/m   AAOx3, NAD NCAT, EOMI No obvious CN deficits Coloring WNL Pt is able to speak clearly, coherently without shortness of breath or increased work of breathing.  Thought process is linear.  Mood is appropriate.   Assessment and Plan:   Lumbar radiculopathy- pt has hx of this.  In the past, has found relief w/ Prednisone and Flexeril.  Prescriptions sent.  Did discuss the potential for lowered immune system on Prednisone and need to social distance and mask when in public.  Pt expressed understanding and is in agreement w/ plan.    Annye Asa, MD 06/10/2018

## 2018-06-10 NOTE — Progress Notes (Signed)
I have discussed the procedure for the virtual visit with the patient who has given consent to proceed with assessment and treatment.   Rajvi Armentor, CMA     

## 2018-06-28 ENCOUNTER — Other Ambulatory Visit: Payer: Self-pay

## 2018-06-28 ENCOUNTER — Encounter: Payer: Self-pay | Admitting: Family Medicine

## 2018-06-28 ENCOUNTER — Ambulatory Visit (INDEPENDENT_AMBULATORY_CARE_PROVIDER_SITE_OTHER): Payer: BC Managed Care – PPO | Admitting: Family Medicine

## 2018-06-28 ENCOUNTER — Ambulatory Visit: Payer: BC Managed Care – PPO | Admitting: Family Medicine

## 2018-06-28 VITALS — BP 116/65 | HR 67 | Temp 97.2°F | Ht 63.5 in | Wt 122.0 lb

## 2018-06-28 DIAGNOSIS — E785 Hyperlipidemia, unspecified: Secondary | ICD-10-CM

## 2018-06-28 NOTE — Progress Notes (Signed)
Pre visit review using our clinic review tool, if applicable. No additional management support is needed unless otherwise documented below in the visit note. 

## 2018-06-28 NOTE — Progress Notes (Signed)
   Virtual Visit via Video   I connected with patient on 06/28/18 at  9:20 AM EDT by a video enabled telemedicine application and verified that I am speaking with the correct person using two identifiers.  Location patient: Home Location provider: [facility] Persons participating in the virtual visit: Patient, Provider, [place] Romelle Starcher D)  I discussed the limitations of evaluation and management by telemedicine and the availability of in person appointments. The patient expressed understanding and agreed to proceed.  Subjective:   HPI:   Hyperlipidemia- chronic problem, on Simvastatin 40mg  and Fish Oil daily.  Pt exercising regularly- walking 1-2x/day.  Denies CP, SOB, HAs, abd pain, N/V.  ROS:   See pertinent positives and negatives per HPI.  Patient Active Problem List   Diagnosis Date Noted  . Lumbar back pain with radiculopathy affecting right lower extremity 04/07/2015  . 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 General medical examination 03/02/2011  . SHOULDER IMPINGEMENT SYNDROME, LEFT 02/15/2010  . NEOPLASM OF UNCERTAIN BEHAVIOR OF SKIN 12/02/2009  . Hyperlipidemia 06/08/2006  . DEPRESSION 06/08/2006  . MIGRAINE HEADACHE 06/08/2006  . INSOMNIA 06/08/2006    Social History   Tobacco Use  . Smoking status: Never Smoker  . Smokeless tobacco: Never Used  Substance Use Topics  . Alcohol use: Yes    Comment: occ.    Current Outpatient Medications:  .  alendronate (FOSAMAX) 70 MG tablet, TAKE 1 TABLET BY MOUTH EVERY 7 DAYS WITH A FULL GLASS OF WATER AND ON EMPTY STOMACH, Disp: 12 tablet, Rfl: 0 .  aspirin 81 MG tablet, Take 81 mg by mouth daily.  , Disp: , Rfl:  .  Calcium Carbonate-Vit D-Min 600-400 MG-UNIT TABS, Take 1 tablet by mouth 2 (two) times daily. , Disp: , Rfl:  .  fluticasone (FLONASE) 50 MCG/ACT nasal spray, SHAKE LIQUID AND USE 2 SPRAYS IN EACH NOSTRIL DAILY, Disp: 16 g, Rfl: 3 .  Omega-3 Fatty Acids (FISH OIL CONCENTRATE PO), Take 1  tablet by mouth daily.  , Disp: , Rfl:  .  simvastatin (ZOCOR) 40 MG tablet, TAKE 1 TABLET(40 MG) BY MOUTH AT BEDTIME, Disp: 90 tablet, Rfl: 0 .  cyclobenzaprine (FLEXERIL) 10 MG tablet, Take 1 tablet (10 mg total) by mouth 3 (three) times daily as needed for muscle spasms., Disp: 30 tablet, Rfl: 0 .  Na Sulfate-K Sulfate-Mg Sulf 17.5-3.13-1.6 GM/177ML SOLN, Suprep (no substitutions)-TAKE AS DIRECTED., Disp: 354 mL, Rfl: 0 .  predniSONE (DELTASONE) 10 MG tablet, 3 tabs x3 days and then 2 tabs x3 days and then 1 tab x3 days.  Take w/ food., Disp: 18 tablet, Rfl: 0  Allergies  Allergen Reactions  . Penicillins Rash  . Tramadol Other (See Comments)    Caused a migraine     Objective:   BP 116/65   Pulse 67   Temp (!) 97.2 F (36.2 C)   Ht 5' 3.5" (1.613 m)   Wt 122 lb (55.3 kg)   BMI 21.27 kg/m   AAOx3, NAD NCAT, EOMI No obvious CN deficits Coloring WNL Pt is able to speak clearly, coherently without shortness of breath or increased work of breathing.  Thought process is linear.  Mood is appropriate.   Assessment and Plan:   Hyperlipidemia- chronic problem, tolerating statin and fish oil w/o difficulty.  Applauded her recent exercise.  Check labs.  Adjust meds prn    Annye Asa, MD 06/28/2018

## 2018-07-05 ENCOUNTER — Other Ambulatory Visit: Payer: Self-pay

## 2018-07-05 ENCOUNTER — Other Ambulatory Visit (INDEPENDENT_AMBULATORY_CARE_PROVIDER_SITE_OTHER): Payer: BC Managed Care – PPO

## 2018-07-05 DIAGNOSIS — E785 Hyperlipidemia, unspecified: Secondary | ICD-10-CM

## 2018-07-05 DIAGNOSIS — Z23 Encounter for immunization: Secondary | ICD-10-CM | POA: Diagnosis not present

## 2018-07-05 LAB — HEPATIC FUNCTION PANEL
ALT: 37 U/L — ABNORMAL HIGH (ref 0–35)
AST: 23 U/L (ref 0–37)
Albumin: 4.5 g/dL (ref 3.5–5.2)
Alkaline Phosphatase: 42 U/L (ref 39–117)
Bilirubin, Direct: 0.1 mg/dL (ref 0.0–0.3)
Total Bilirubin: 0.7 mg/dL (ref 0.2–1.2)
Total Protein: 6.6 g/dL (ref 6.0–8.3)

## 2018-07-05 LAB — BASIC METABOLIC PANEL
BUN: 19 mg/dL (ref 6–23)
CO2: 29 mEq/L (ref 19–32)
Calcium: 10.1 mg/dL (ref 8.4–10.5)
Chloride: 104 mEq/L (ref 96–112)
Creatinine, Ser: 0.81 mg/dL (ref 0.40–1.20)
GFR: 71.33 mL/min (ref >60.00)
Glucose, Bld: 90 mg/dL (ref 70–99)
Potassium: 4.3 mEq/L (ref 3.5–5.1)
Sodium: 142 mEq/L (ref 135–145)

## 2018-07-05 LAB — LIPID PANEL
Cholesterol: 186 mg/dL (ref 0–200)
HDL: 65.7 mg/dL (ref >39.00)
LDL Cholesterol: 94 mg/dL (ref 0–99)
NonHDL: 119.87
Total CHOL/HDL Ratio: 3
Triglycerides: 129 mg/dL (ref 0.0–149.0)
VLDL: 25.8 mg/dL (ref 0.0–40.0)

## 2018-07-05 NOTE — Progress Notes (Addendum)
Administered 2nd SHINGRIX vaccine IM right arm. Patient tolerated well.   Above order is mine.  Annye Asa, MD

## 2018-07-05 NOTE — Addendum Note (Signed)
Addended by: Midge Minium on: 07/05/2018 12:06 PM   Modules accepted: Level of Service

## 2018-09-02 ENCOUNTER — Other Ambulatory Visit: Payer: Self-pay | Admitting: Family Medicine

## 2018-11-14 ENCOUNTER — Ambulatory Visit (INDEPENDENT_AMBULATORY_CARE_PROVIDER_SITE_OTHER): Payer: BC Managed Care – PPO

## 2018-11-14 ENCOUNTER — Other Ambulatory Visit: Payer: Self-pay

## 2018-11-14 DIAGNOSIS — Z23 Encounter for immunization: Secondary | ICD-10-CM | POA: Diagnosis not present

## 2018-12-11 ENCOUNTER — Encounter: Payer: Self-pay | Admitting: Family Medicine

## 2018-12-11 ENCOUNTER — Other Ambulatory Visit: Payer: Self-pay | Admitting: General Practice

## 2018-12-11 ENCOUNTER — Other Ambulatory Visit: Payer: Self-pay | Admitting: Family Medicine

## 2018-12-11 MED ORDER — SIMVASTATIN 40 MG PO TABS: 40.0000 mg | ORAL_TABLET | Freq: Every day | ORAL | 1 refills | Status: DC

## 2018-12-27 ENCOUNTER — Encounter: Payer: Self-pay | Admitting: Family Medicine

## 2018-12-27 ENCOUNTER — Ambulatory Visit (INDEPENDENT_AMBULATORY_CARE_PROVIDER_SITE_OTHER): Payer: BC Managed Care – PPO | Admitting: Family Medicine

## 2018-12-27 ENCOUNTER — Other Ambulatory Visit: Payer: Self-pay

## 2018-12-27 VITALS — BP 110/81 | HR 56 | Temp 98.0°F | Resp 17 | Ht 64.0 in | Wt 125.1 lb

## 2018-12-27 DIAGNOSIS — E785 Hyperlipidemia, unspecified: Secondary | ICD-10-CM

## 2018-12-27 DIAGNOSIS — Z0000 Encounter for general adult medical examination without abnormal findings: Secondary | ICD-10-CM

## 2018-12-27 DIAGNOSIS — M810 Age-related osteoporosis without current pathological fracture: Secondary | ICD-10-CM | POA: Diagnosis not present

## 2018-12-27 LAB — BASIC METABOLIC PANEL
BUN: 14 mg/dL (ref 6–23)
CO2: 31 mEq/L (ref 19–32)
Calcium: 9.9 mg/dL (ref 8.4–10.5)
Chloride: 104 mEq/L (ref 96–112)
Creatinine, Ser: 0.77 mg/dL (ref 0.40–1.20)
GFR: 75.51 mL/min (ref >60.00)
Glucose, Bld: 95 mg/dL (ref 70–99)
Potassium: 4.3 mEq/L (ref 3.5–5.1)
Sodium: 143 mEq/L (ref 135–145)

## 2018-12-27 LAB — CBC WITH DIFFERENTIAL/PLATELET
Basophils Absolute: 0 10*3/uL (ref 0.0–0.1)
Basophils Relative: 0.5 % (ref 0.0–3.0)
Eosinophils Absolute: 0.1 10*3/uL (ref 0.0–0.7)
Eosinophils Relative: 2.3 % (ref 0.0–5.0)
HCT: 42.1 % (ref 36.0–46.0)
Hemoglobin: 14.1 g/dL (ref 12.0–15.0)
Lymphocytes Relative: 33.4 % (ref 12.0–46.0)
Lymphs Abs: 1.9 10*3/uL (ref 0.7–4.0)
MCHC: 33.4 g/dL (ref 30.0–36.0)
MCV: 94.5 fl (ref 78.0–100.0)
Monocytes Absolute: 0.3 10*3/uL (ref 0.1–1.0)
Monocytes Relative: 5.3 % (ref 3.0–12.0)
Neutro Abs: 3.2 10*3/uL (ref 1.4–7.7)
Neutrophils Relative %: 58.5 % (ref 43.0–77.0)
Platelets: 197 10*3/uL (ref 150.0–400.0)
RBC: 4.46 Mil/uL (ref 3.87–5.11)
RDW: 13 % (ref 11.5–15.5)
WBC: 5.5 10*3/uL (ref 4.0–10.5)

## 2018-12-27 LAB — HEPATIC FUNCTION PANEL
ALT: 37 U/L — ABNORMAL HIGH (ref 0–35)
AST: 24 U/L (ref 0–37)
Albumin: 4.8 g/dL (ref 3.5–5.2)
Alkaline Phosphatase: 54 U/L (ref 39–117)
Bilirubin, Direct: 0.1 mg/dL (ref 0.0–0.3)
Total Bilirubin: 0.9 mg/dL (ref 0.2–1.2)
Total Protein: 7 g/dL (ref 6.0–8.3)

## 2018-12-27 LAB — LIPID PANEL
Cholesterol: 177 mg/dL (ref 0–200)
HDL: 63.7 mg/dL (ref >39.00)
LDL Cholesterol: 87 mg/dL (ref 0–99)
NonHDL: 113.71
Total CHOL/HDL Ratio: 3
Triglycerides: 133 mg/dL (ref 0.0–149.0)
VLDL: 26.6 mg/dL (ref 0.0–40.0)

## 2018-12-27 LAB — VITAMIN D 25 HYDROXY (VIT D DEFICIENCY, FRACTURES): VITD: 49.84 ng/mL (ref 30.00–100.00)

## 2018-12-27 LAB — TSH: TSH: 1.93 u[IU]/mL (ref 0.35–4.50)

## 2018-12-27 NOTE — Assessment & Plan Note (Signed)
Chronic problem.  UTD on DEXA.  She has been on Fosamax x5 yrs- will do drug holiday.  Check Vit D level and replete prn.

## 2018-12-27 NOTE — Progress Notes (Signed)
   Subjective:    Patient ID: Dominique Harrell, female    DOB: May 09, 1955, 63 y.o.   MRN: [id number]  HPI CPE- UTD on mammo, colonoscopy, DEXA, Tdap, flu.  Has pap scheduled in January.  No concerns today   Review of Systems Patient reports no vision/ hearing changes, adenopathy,fever, weight change,  persistant/recurrent hoarseness , swallowing issues, chest pain, palpitations, edema, persistant/recurrent cough, hemoptysis, dyspnea (rest/exertional/paroxysmal nocturnal), gastrointestinal bleeding (melena, rectal bleeding), abdominal pain, significant heartburn, bowel changes, GU symptoms (dysuria, hematuria, incontinence), Gyn symptoms (abnormal  bleeding, pain),  syncope, focal weakness, memory loss, numbness & tingling, skin/hair/nail changes, abnormal bruising or bleeding, anxiety, or depression.     Objective:   Physical Exam General Appearance:    Alert, cooperative, no distress, appears stated age  Head:    Normocephalic, without obvious abnormality, atraumatic  Eyes:    PERRL, conjunctiva/corneas clear, EOM's intact, fundi    benign, both eyes  Ears:    Normal TM's and external ear canals, both ears  Nose:   Nares normal, septum midline, mucosa normal, no drainage    or sinus tenderness  Throat:   Lips, mucosa, and tongue normal; teeth and gums normal  Neck:   Supple, symmetrical, trachea midline, no adenopathy;    Thyroid: no enlargement/tenderness/nodules  Back:     Symmetric, no curvature, ROM normal, no CVA tenderness  Lungs:     Clear to auscultation bilaterally, respirations unlabored  Chest Wall:    No tenderness or deformity   Heart:    Regular rate and rhythm, S1 and S2 normal, no murmur, rub   or gallop  Breast Exam:    Deferred to GYN  Abdomen:     Soft, non-tender, bowel sounds active all four quadrants,    no masses, no organomegaly  Genitalia:    Deferred to GYN  Rectal:    Extremities:   Extremities normal, atraumatic, no cyanosis or edema  Pulses:   2+ and  symmetric all extremities  Skin:   Skin color, texture, turgor normal, no rashes or lesions  Lymph nodes:   Cervical, supraclavicular, and axillary nodes normal  Neurologic:   CNII-XII intact, normal strength, sensation and reflexes    throughout          Assessment & Plan:

## 2018-12-27 NOTE — Assessment & Plan Note (Signed)
Pt's PE WNL.  UTD on colonoscopy, immunizations, mammo.  Pap scheduled.  Check labs.  Anticipatory guidance provided.

## 2018-12-27 NOTE — Assessment & Plan Note (Signed)
Chronic problem.  Tolerating statin w/o difficulty.  Check labs.  Adjust meds prn  

## 2018-12-27 NOTE — Patient Instructions (Addendum)
Follow up in 6 months to recheck cholesterol We'll notify you of your lab results and make any changes if needed Keep up the good work on healthy diet and regular exercise- you look great! Call with any questions or concerns Stay Safe!  Stay Healthy!! 

## 2019-02-17 ENCOUNTER — Encounter: Payer: Self-pay | Admitting: Family Medicine

## 2019-02-19 ENCOUNTER — Other Ambulatory Visit: Payer: Self-pay

## 2019-02-19 ENCOUNTER — Encounter: Payer: Self-pay | Admitting: Family Medicine

## 2019-02-19 ENCOUNTER — Ambulatory Visit: Payer: BC Managed Care – PPO | Admitting: Family Medicine

## 2019-02-19 VITALS — BP 120/81 | HR 64 | Temp 97.9°F | Resp 16 | Ht 64.0 in | Wt 126.1 lb

## 2019-02-19 DIAGNOSIS — M7989 Other specified soft tissue disorders: Secondary | ICD-10-CM

## 2019-02-19 DIAGNOSIS — R1909 Other intra-abdominal and pelvic swelling, mass and lump: Secondary | ICD-10-CM | POA: Diagnosis not present

## 2019-02-19 NOTE — Addendum Note (Signed)
Addended by: Midge Minium on: 02/19/2019 04:52 PM   Modules accepted: Orders

## 2019-02-19 NOTE — Patient Instructions (Signed)
Follow up as needed or as scheduled We'll call you with your ultrasound appt We'll just make sure that this is normal and not related to the previous stuff Call with any questions or concerns Stay Safe!  Stay Healthy!

## 2019-02-19 NOTE — Progress Notes (Signed)
This order was placed

## 2019-02-19 NOTE — Progress Notes (Signed)
   Subjective:    Patient ID: Dominique Harrell, female    DOB: 11/08/1955, 64 y.o.   MRN: [id number]  HPI Leg pain- pt has had residual area behind L knee after mass removal.  Area is now more swollen and knee is uncomfortable.  sxs started ~1 month.  Now having a burning sensation 'at the top'.  Will have numbness radiating from L thigh/groin into pelvis.  This started a couple of weeks ago.  On MRI done 07/2018 had 'small ovoid area of increased signal in proximal thigh' thought to be residual postop fluid collection.  Has new soft tissue mass in L groin.   Review of Systems For ROS see HPI   This visit occurred during the SARS-CoV-2 public health emergency.  Safety protocols were in place, including screening questions prior to the visit, additional usage of staff PPE, and extensive cleaning of exam room while observing appropriate contact time as indicated for disinfecting solutions.       Objective:   Physical Exam Vitals reviewed.  Constitutional:      General: She is not in acute distress.    Appearance: Normal appearance. She is not ill-appearing.  HENT:     Head: Normocephalic and atraumatic.  Musculoskeletal:        General: Tenderness (TTP and soft tissue swelling posterior to L knee.  TTP over soft tissue swelling in L groin) present.  Skin:    General: Skin is warm and dry.     Findings: No bruising or erythema.  Neurological:     General: No focal deficit present.     Mental Status: She is alert and oriented to person, place, and time.  Psychiatric:        Mood and Affect: Mood normal.        Behavior: Behavior normal.        Thought Content: Thought content normal.           Assessment & Plan:  Soft tissue mass L posterior knee and L groin- new.  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  Area in groin could be reactive LAD but impossible to tell w/o imaging to assess for possible mass  given hx of Liposarcoma.  If areas are concerning or inconclusive on [place], pt will need to go back to [facility] for re-eval.  Pt expressed understanding and is in agreement w/ plan.

## 2019-02-19 NOTE — Progress Notes (Signed)
[facility] called in stating that Pelvic Limited [id number] needs to be ordered with dx. Left groin mass in addition to orders in the system.

## 2019-02-20 ENCOUNTER — Encounter: Payer: Self-pay | Admitting: Family Medicine

## 2019-02-27 ENCOUNTER — Other Ambulatory Visit: Payer: Self-pay

## 2019-02-27 ENCOUNTER — Ambulatory Visit
Admission: RE | Admit: 2019-02-27 | Discharge: 2019-02-27 | Disposition: A | Discharge status: Home / Self Care | Payer: BC Managed Care – PPO | Source: Ambulatory Visit | Attending: Family Medicine | Admitting: Family Medicine

## 2019-02-27 DIAGNOSIS — R1909 Other intra-abdominal and pelvic swelling, mass and lump: Secondary | ICD-10-CM

## 2019-02-27 DIAGNOSIS — M7989 Other specified soft tissue disorders: Secondary | ICD-10-CM

## 2019-02-28 ENCOUNTER — Encounter: Payer: Self-pay | Admitting: Family Medicine

## 2019-03-06 ENCOUNTER — Encounter: Payer: Self-pay | Admitting: Family Medicine

## 2019-03-07 ENCOUNTER — Encounter: Payer: Self-pay | Admitting: Family Medicine

## 2019-03-12 ENCOUNTER — Ambulatory Visit (INDEPENDENT_AMBULATORY_CARE_PROVIDER_SITE_OTHER): Payer: BC Managed Care – PPO

## 2019-03-12 ENCOUNTER — Other Ambulatory Visit: Payer: Self-pay

## 2019-03-12 DIAGNOSIS — Z23 Encounter for immunization: Secondary | ICD-10-CM | POA: Diagnosis not present

## 2019-03-12 NOTE — Progress Notes (Signed)
[facility] 64 y.o. female presents to office today for Tdap booster. Administered BOOSTRIX 0.5 mL IM right arm per Annye Asa, MD. Patient tolerated well.

## 2019-03-24 LAB — HM MAMMOGRAPHY

## 2019-04-04 ENCOUNTER — Encounter: Payer: Self-pay | Admitting: General Practice

## 2019-05-02 ENCOUNTER — Other Ambulatory Visit: Payer: Self-pay

## 2019-05-02 ENCOUNTER — Ambulatory Visit: Payer: BC Managed Care – PPO | Attending: Internal Medicine

## 2019-05-02 DIAGNOSIS — Z23 Encounter for immunization: Secondary | ICD-10-CM

## 2019-05-02 NOTE — Progress Notes (Signed)
   Covid-19 Vaccination Clinic  Name:  Dominique Harrell    MRN: [id number] DOB: 08/03/1955  05/02/2019  Dominique Harrell was observed post Covid-19 immunization for 15 minutes without incident. She was provided with Vaccine Information Sheet and instruction to access the V-Safe system.   Dominique Harrell was instructed to call 911 with any severe reactions post vaccine: Marland Kitchen Difficulty breathing  . Swelling of face and throat  . A fast heartbeat  . A bad rash all over body  . Dizziness and weakness   Immunizations Administered    Name Date Dose VIS Date Route   Pfizer COVID-19 Vaccine 05/02/2019  1:20 PM 0.3 mL 01/24/2019 Intramuscular   Manufacturer: [place]   Lot: [id number]   [place]: [id number]

## 2019-05-27 ENCOUNTER — Ambulatory Visit: Payer: BC Managed Care – PPO | Attending: Internal Medicine

## 2019-05-27 DIAGNOSIS — Z23 Encounter for immunization: Secondary | ICD-10-CM

## 2019-05-27 NOTE — Progress Notes (Signed)
   Covid-19 Vaccination Clinic  Name:  Dominique Harrell    MRN: [id number] DOB: Nov 08, 1955  05/27/2019  Ms. Dominique Harrell was observed post Covid-19 immunization for 15 minutes without incident. She was provided with Vaccine Information Sheet and instruction to access the V-Safe system.   Ms. Dominique Harrell was instructed to call 911 with any severe reactions post vaccine: Marland Kitchen Difficulty breathing  . Swelling of face and throat  . A fast heartbeat  . A bad rash all over body  . Dizziness and weakness   Immunizations Administered    Name Date Dose VIS Date Route   Pfizer COVID-19 Vaccine 05/27/2019  2:06 PM 0.3 mL 01/24/2019 Intramuscular   Manufacturer: [place]   Lot: [id number]   [place]: [id number]

## 2019-06-13 ENCOUNTER — Other Ambulatory Visit: Payer: Self-pay | Admitting: General Practice

## 2019-06-13 MED ORDER — SIMVASTATIN 40 MG PO TABS: 40.0000 mg | ORAL_TABLET | Freq: Every day | ORAL | 1 refills | Status: DC

## 2019-06-27 ENCOUNTER — Encounter: Payer: Self-pay | Admitting: Family Medicine

## 2019-06-27 ENCOUNTER — Ambulatory Visit: Payer: BC Managed Care – PPO | Admitting: Family Medicine

## 2019-06-27 ENCOUNTER — Other Ambulatory Visit: Payer: Self-pay

## 2019-06-27 VITALS — BP 118/78 | HR 76 | Temp 97.9°F | Resp 16 | Ht 64.0 in | Wt 118.2 lb

## 2019-06-27 DIAGNOSIS — E785 Hyperlipidemia, unspecified: Secondary | ICD-10-CM

## 2019-06-27 LAB — BASIC METABOLIC PANEL
BUN: 16 mg/dL (ref 6–23)
CO2: 31 mEq/L (ref 19–32)
Calcium: 9.3 mg/dL (ref 8.4–10.5)
Chloride: 104 mEq/L (ref 96–112)
Creatinine, Ser: 0.7 mg/dL (ref 0.40–1.20)
GFR: 84.16 mL/min (ref >60.00)
Glucose, Bld: 87 mg/dL (ref 70–99)
Potassium: 4.3 mEq/L (ref 3.5–5.1)
Sodium: 140 mEq/L (ref 135–145)

## 2019-06-27 LAB — LIPID PANEL
Cholesterol: 204 mg/dL — ABNORMAL HIGH (ref 0–200)
HDL: 67.8 mg/dL (ref >39.00)
LDL Cholesterol: 112 mg/dL — ABNORMAL HIGH (ref 0–99)
NonHDL: 136.21
Total CHOL/HDL Ratio: 3
Triglycerides: 121 mg/dL (ref 0.0–149.0)
VLDL: 24.2 mg/dL (ref 0.0–40.0)

## 2019-06-27 LAB — HEPATIC FUNCTION PANEL
ALT: 29 U/L (ref 0–35)
AST: 25 U/L (ref 0–37)
Albumin: 4.8 g/dL (ref 3.5–5.2)
Alkaline Phosphatase: 47 U/L (ref 39–117)
Bilirubin, Direct: 0.1 mg/dL (ref 0.0–0.3)
Total Bilirubin: 0.8 mg/dL (ref 0.2–1.2)
Total Protein: 7 g/dL (ref 6.0–8.3)

## 2019-06-27 NOTE — Progress Notes (Signed)
   Subjective:    Patient ID: Dominique Harrell, female    DOB: 10/26/1955, 64 y.o.   MRN: [id number]  HPI Hyperlipidemia- chronic problem, on Simvastatin 40mg  daily.  Pt has lost 8 lbs since last visit.  Pt is exercising regularly and has changed diet.  No CP, SOB, HAs, abd pain, N/V.    Review of Systems For ROS see HPI   This visit occurred during the SARS-CoV-2 public health emergency.  Safety protocols were in place, including screening questions prior to the visit, additional usage of staff PPE, and extensive cleaning of exam room while observing appropriate contact time as indicated for disinfecting solutions.       Objective:   Physical Exam Vitals reviewed.  Constitutional:      General: She is not in acute distress.    Appearance: Normal appearance. She is well-developed.  HENT:     Head: Normocephalic and atraumatic.  Eyes:     Conjunctiva/sclera: Conjunctivae normal.     Pupils: Pupils are equal, round, and reactive to light.  Neck:     Thyroid: No thyromegaly.  Cardiovascular:     Rate and Rhythm: Normal rate and regular rhythm.     Heart sounds: Normal heart sounds. No murmur.  Pulmonary:     Effort: Pulmonary effort is normal. No respiratory distress.     Breath sounds: Normal breath sounds.  Abdominal:     General: There is no distension.     Palpations: Abdomen is soft.     Tenderness: There is no abdominal tenderness.  Musculoskeletal:     Cervical back: Normal range of motion and neck supple.  Lymphadenopathy:     Cervical: No cervical adenopathy.  Skin:    General: Skin is warm and dry.  Neurological:     Mental Status: She is alert and oriented to person, place, and time.  Psychiatric:        Behavior: Behavior normal.           Assessment & Plan:

## 2019-06-27 NOTE — Assessment & Plan Note (Signed)
Chronic problem.  Tolerating statin w/o difficulty.  Applauded her efforts at healthy diet and regular exercise.  Check labs.  Adjust meds prn

## 2019-06-27 NOTE — Patient Instructions (Signed)
Schedule your complete physical in 6 months We'll notify you of your lab results and make any changes if needed Keep up the good work!  You look great! Call with any questions or concerns Have a great summer!!! 

## 2019-06-30 ENCOUNTER — Encounter: Payer: Self-pay | Admitting: Family Medicine

## 2019-08-08 ENCOUNTER — Telehealth: Payer: Self-pay | Admitting: Family Medicine

## 2019-08-08 NOTE — Telephone Encounter (Signed)
If redness is spreading, skin becomes warm to touch, or skin ulcerates she will need to be seen.  Otherwise she can continue to monitor at home

## 2019-08-08 NOTE — Telephone Encounter (Signed)
Please advise 

## 2019-08-08 NOTE — Telephone Encounter (Signed)
Patient notified of PCP recommendations and is agreement and expresses an understanding.  

## 2019-08-08 NOTE — Telephone Encounter (Signed)
Pt called in stating that she has a spider bite that is red/purple looking, she states that it is not hurting or weeping. She wanted to know if she could just ignore it? Please advise

## 2019-12-22 ENCOUNTER — Encounter: Payer: Self-pay | Admitting: Family Medicine

## 2019-12-22 MED ORDER — SIMVASTATIN 40 MG PO TABS: 40.0000 mg | ORAL_TABLET | Freq: Every day | ORAL | 1 refills | Status: DC

## 2020-01-02 ENCOUNTER — Ambulatory Visit (INDEPENDENT_AMBULATORY_CARE_PROVIDER_SITE_OTHER): Payer: BC Managed Care – PPO | Admitting: Family Medicine

## 2020-01-02 ENCOUNTER — Other Ambulatory Visit: Payer: Self-pay

## 2020-01-02 ENCOUNTER — Encounter: Payer: Self-pay | Admitting: Family Medicine

## 2020-01-02 VITALS — BP 116/70 | HR 60 | Temp 97.7°F | Resp 17 | Ht 64.0 in | Wt 122.2 lb

## 2020-01-02 DIAGNOSIS — M810 Age-related osteoporosis without current pathological fracture: Secondary | ICD-10-CM | POA: Diagnosis not present

## 2020-01-02 DIAGNOSIS — Z0000 Encounter for general adult medical examination without abnormal findings: Secondary | ICD-10-CM | POA: Diagnosis not present

## 2020-01-02 DIAGNOSIS — E785 Hyperlipidemia, unspecified: Secondary | ICD-10-CM

## 2020-01-02 LAB — LIPID PANEL
Cholesterol: 159 mg/dL (ref 0–200)
HDL: 68.5 mg/dL (ref >39.00)
LDL Cholesterol: 78 mg/dL (ref 0–99)
NonHDL: 90.49
Total CHOL/HDL Ratio: 2
Triglycerides: 62 mg/dL (ref 0.0–149.0)
VLDL: 12.4 mg/dL (ref 0.0–40.0)

## 2020-01-02 LAB — CBC WITH DIFFERENTIAL/PLATELET
Basophils Absolute: 0 10*3/uL (ref 0.0–0.1)
Basophils Relative: 0.5 % (ref 0.0–3.0)
Eosinophils Absolute: 0.2 10*3/uL (ref 0.0–0.7)
Eosinophils Relative: 4.6 % (ref 0.0–5.0)
HCT: 42.1 % (ref 36.0–46.0)
Hemoglobin: 14.2 g/dL (ref 12.0–15.0)
Lymphocytes Relative: 35.7 % (ref 12.0–46.0)
Lymphs Abs: 1.8 10*3/uL (ref 0.7–4.0)
MCHC: 33.7 g/dL (ref 30.0–36.0)
MCV: 94.3 fl (ref 78.0–100.0)
Monocytes Absolute: 0.3 10*3/uL (ref 0.1–1.0)
Monocytes Relative: 6 % (ref 3.0–12.0)
Neutro Abs: 2.6 10*3/uL (ref 1.4–7.7)
Neutrophils Relative %: 53.2 % (ref 43.0–77.0)
Platelets: 182 10*3/uL (ref 150.0–400.0)
RBC: 4.46 Mil/uL (ref 3.87–5.11)
RDW: 12.7 % (ref 11.5–15.5)
WBC: 4.9 10*3/uL (ref 4.0–10.5)

## 2020-01-02 LAB — BASIC METABOLIC PANEL
BUN: 18 mg/dL (ref 6–23)
CO2: 30 mEq/L (ref 19–32)
Calcium: 9.5 mg/dL (ref 8.4–10.5)
Chloride: 108 mEq/L (ref 96–112)
Creatinine, Ser: 0.67 mg/dL (ref 0.40–1.20)
GFR: 92.09 mL/min (ref >60.00)
Glucose, Bld: 93 mg/dL (ref 70–99)
Potassium: 4.6 mEq/L (ref 3.5–5.1)
Sodium: 146 mEq/L — ABNORMAL HIGH (ref 135–145)

## 2020-01-02 LAB — HEPATIC FUNCTION PANEL
ALT: 23 U/L (ref 0–35)
AST: 20 U/L (ref 0–37)
Albumin: 4.7 g/dL (ref 3.5–5.2)
Alkaline Phosphatase: 50 U/L (ref 39–117)
Bilirubin, Direct: 0.1 mg/dL (ref 0.0–0.3)
Total Bilirubin: 0.7 mg/dL (ref 0.2–1.2)
Total Protein: 6.9 g/dL (ref 6.0–8.3)

## 2020-01-02 LAB — TSH: TSH: 1.28 u[IU]/mL (ref 0.35–4.50)

## 2020-01-02 LAB — VITAMIN D 25 HYDROXY (VIT D DEFICIENCY, FRACTURES): VITD: 53.52 ng/mL (ref 30.00–100.00)

## 2020-01-02 NOTE — Assessment & Plan Note (Signed)
Pt's PE WNL.  UTD on mammo, colonoscopy, immunizations.  Check labs.  Anticipatory guidance provided.  

## 2020-01-02 NOTE — Progress Notes (Signed)
   Subjective:    Patient ID: Dominique Harrell, female    DOB: July 17, 1955, 64 y.o.   MRN: [id number]  HPI CPE- UTD on mammo, colonoscopy, Tdap, flu, COVID vaccines.  Reviewed past medical, surgical, family and social histories.   Patient Care Team    Relationship Specialty Notifications Start End  Midge Minium, MD PCP - General   01/26/10   Servando Salina, MD Consulting Physician Obstetrics and Gynecology  11/02/14     Health Maintenance  Topic Date Due  . DEXA SCAN  02/18/2020 (Originally 08/15/2019)  . HIV Screening  01/01/2021 (Originally 02/27/1970)  . MAMMOGRAM  03/23/2020  . PAP SMEAR-Modifier  03/29/2022  . COLONOSCOPY  03/05/2028  . TETANUS/TDAP  03/11/2029  . INFLUENZA VACCINE  Completed  . COVID-19 Vaccine  Completed  . Hepatitis C Screening  Completed      Review of Systems Patient reports no vision/ hearing changes, adenopathy,fever, weight change,  persistant/recurrent hoarseness , swallowing issues, chest pain, palpitations, edema, persistant/recurrent cough, hemoptysis, dyspnea (rest/exertional/paroxysmal nocturnal), gastrointestinal bleeding (melena, rectal bleeding), abdominal pain, significant heartburn, bowel changes, GU symptoms (dysuria, hematuria, incontinence), Gyn symptoms (abnormal  bleeding, pain),  syncope, focal weakness, memory loss, numbness & tingling, skin/hair/nail changes, abnormal bruising or bleeding, anxiety, or depression.   This visit occurred during the SARS-CoV-2 public health emergency.  Safety protocols were in place, including screening questions prior to the visit, additional usage of staff PPE, and extensive cleaning of exam room while observing appropriate contact time as indicated for disinfecting solutions.       Objective:   Physical Exam General Appearance:    Alert, cooperative, no distress, appears stated age  Head:    Normocephalic, without obvious abnormality, atraumatic  Eyes:    PERRL, conjunctiva/corneas clear,  EOM's intact, fundi    benign, both eyes  Ears:    Normal TM's and external ear canals, both ears  Nose:   Deferred due to COVID  Throat:   Neck:   Supple, symmetrical, trachea midline, no adenopathy;    Thyroid: no enlargement/tenderness/nodules  Back:     Symmetric, no curvature, ROM normal, no CVA tenderness  Lungs:     Clear to auscultation bilaterally, respirations unlabored  Chest Wall:    No tenderness or deformity   Heart:    Regular rate and rhythm, S1 and S2 normal, no murmur, rub   or gallop  Breast Exam:    Deferred to GYN  Abdomen:     Soft, non-tender, bowel sounds active all four quadrants,    no masses, no organomegaly  Genitalia:    Deferred to GYN  Rectal:    Extremities:   Extremities normal, atraumatic, no cyanosis or edema  Pulses:   2+ and symmetric all extremities  Skin:   Skin color, texture, turgor normal, no rashes or lesions  Lymph nodes:   Cervical, supraclavicular, and axillary nodes normal  Neurologic:   CNII-XII intact, normal strength, sensation and reflexes    throughout          Assessment & Plan:

## 2020-01-02 NOTE — Assessment & Plan Note (Signed)
Due for DEXA- pt plans to get when she has her mammo in Feb.  Check Vit D level and replete prn.

## 2020-01-02 NOTE — Patient Instructions (Signed)
Follow up in 6 months to recheck cholesterol We'll notify you of your lab results and make any changes if needed Keep up the good work on healthy diet and regular exercise- you look great! Call with any questions or concerns Stay Safe!  Stay Healthy! Happy Holidays! 

## 2020-01-02 NOTE — Assessment & Plan Note (Signed)
Tolerating statin w/o difficulty.  Check labs.  Adjust meds prn  

## 2020-01-14 ENCOUNTER — Encounter: Payer: Self-pay | Admitting: Family Medicine

## 2020-02-13 ENCOUNTER — Encounter: Payer: Self-pay | Admitting: Family Medicine

## 2020-02-13 DIAGNOSIS — M810 Age-related osteoporosis without current pathological fracture: Secondary | ICD-10-CM

## 2020-04-06 LAB — HM DEXA SCAN

## 2020-04-21 ENCOUNTER — Telehealth: Payer: Self-pay

## 2020-04-21 NOTE — Telephone Encounter (Signed)
Called and spoke with patient and informed her that per your order she can hold off on the fosamax. Patient understood. No further concerns at this time.

## 2020-04-21 NOTE — Telephone Encounter (Signed)
You can hold off on the Fosamax for now and we'll take a medication holiday since you have been on this since 2015

## 2020-04-21 NOTE — Telephone Encounter (Signed)
Spoke with patient about bone density results. Patient wants to know if she should continue the fosamax? Please advise

## 2020-05-20 ENCOUNTER — Encounter: Payer: Self-pay | Admitting: Family Medicine

## 2020-05-20 ENCOUNTER — Other Ambulatory Visit: Payer: Self-pay

## 2020-05-20 DIAGNOSIS — R0981 Nasal congestion: Secondary | ICD-10-CM

## 2020-05-20 MED ORDER — FLUTICASONE PROPIONATE 50 MCG/ACT NA SUSP: NASAL | 3 refills | Status: DC

## 2020-06-14 ENCOUNTER — Other Ambulatory Visit: Payer: Self-pay | Admitting: Family Medicine

## 2020-06-17 ENCOUNTER — Other Ambulatory Visit: Payer: Self-pay

## 2020-06-17 ENCOUNTER — Ambulatory Visit: Payer: Medicare PPO | Admitting: Family Medicine

## 2020-06-17 ENCOUNTER — Encounter: Payer: Self-pay | Admitting: Family Medicine

## 2020-06-17 VITALS — BP 112/75 | HR 89 | Temp 98.5°F | Resp 19 | Ht 64.0 in | Wt 126.6 lb

## 2020-06-17 DIAGNOSIS — D225 Melanocytic nevi of trunk: Secondary | ICD-10-CM | POA: Insufficient documentation

## 2020-06-17 DIAGNOSIS — L821 Other seborrheic keratosis: Secondary | ICD-10-CM | POA: Insufficient documentation

## 2020-06-17 DIAGNOSIS — M5416 Radiculopathy, lumbar region: Secondary | ICD-10-CM | POA: Diagnosis not present

## 2020-06-17 DIAGNOSIS — L814 Other melanin hyperpigmentation: Secondary | ICD-10-CM | POA: Insufficient documentation

## 2020-06-17 MED ORDER — TIZANIDINE HCL 4 MG PO TABS: 4.0000 mg | ORAL_TABLET | Freq: Three times a day (TID) | ORAL | 0 refills | Status: DC | PRN

## 2020-06-17 MED ORDER — PREDNISONE 10 MG PO TABS: ORAL_TABLET | ORAL | 0 refills | Status: DC

## 2020-06-17 NOTE — Patient Instructions (Addendum)
Follow up as needed or as scheduled START the Prednisone as directed- take w/ food Use the Tizanidine as needed for spasm relief Heat!!! Call with any questions or concerns Hang in there!!!

## 2020-06-17 NOTE — Progress Notes (Signed)
   Subjective:    Patient ID: [name], female    DOB: 1955-06-05, 65 y.o.   MRN: [id number]  HPI Back pain- pt hurt her back on Sunday while dusting pollen off a bench.  R sided, 'always the R side'.  No radiation of pain this time.  No weakness or numbness of legs.  Able to sit in a recliner comfortably, able to stand comfortably, not able to transition nor walk.  Painful to bend forward.  Not currently driving.  No incontinence.   Review of Systems For ROS see HPI   This visit occurred during the SARS-CoV-2 public health emergency.  Safety protocols were in place, including screening questions prior to the visit, additional usage of staff PPE, and extensive cleaning of exam room while observing appropriate contact time as indicated for disinfecting solutions.       Objective:   Physical Exam Vitals reviewed.  Constitutional:      Appearance: Normal appearance.  HENT:     Head: Normocephalic and atraumatic.  Cardiovascular:     Pulses: Normal pulses.  Musculoskeletal:        General: Tenderness (TTP over R lumbar paraspinal muscle) present.  Skin:    General: Skin is warm and dry.  Neurological:     General: No focal deficit present.     Mental Status: She is alert and oriented to person, place, and time.     Gait: Gait abnormal (antalgic gait).     Comments: (-) SLR bilaterally  Psychiatric:        Mood and Affect: Mood normal.        Behavior: Behavior normal.        Thought Content: Thought content normal.           Assessment & Plan:  R lumbar back pain- recurrent issue for pt.  Will start Prednisone, muscle relaxer, and heat prn.  Reviewed supportive care and red flags that should prompt return.  Pt expressed understanding and is in agreement w/ plan.

## 2020-06-25 ENCOUNTER — Telehealth: Payer: Self-pay | Admitting: Family Medicine

## 2020-06-25 NOTE — Telephone Encounter (Signed)
Pt called in stating that at her appt on 06/29/20 it will be 10 days since having a covid exposure. She isn't having any sx and she and her husband have tested neg.  Please advise if it is ok for them to keep appts on 06/29/20

## 2020-06-25 NOTE — Telephone Encounter (Signed)
Pt is aware of this//ELEA

## 2020-06-25 NOTE — Telephone Encounter (Signed)
Ok to keep appts

## 2020-06-29 ENCOUNTER — Ambulatory Visit: Payer: BC Managed Care – PPO | Admitting: Family Medicine

## 2020-06-29 ENCOUNTER — Other Ambulatory Visit: Payer: Self-pay

## 2020-06-29 ENCOUNTER — Other Ambulatory Visit: Payer: Self-pay | Admitting: Family Medicine

## 2020-06-29 ENCOUNTER — Encounter: Payer: Self-pay | Admitting: Family Medicine

## 2020-06-29 VITALS — BP 120/70 | HR 57 | Temp 98.1°F | Resp 18 | Ht 64.0 in | Wt 126.8 lb

## 2020-06-29 DIAGNOSIS — E785 Hyperlipidemia, unspecified: Secondary | ICD-10-CM | POA: Diagnosis not present

## 2020-06-29 DIAGNOSIS — Z23 Encounter for immunization: Secondary | ICD-10-CM | POA: Diagnosis not present

## 2020-06-29 DIAGNOSIS — F32A Depression, unspecified: Secondary | ICD-10-CM | POA: Diagnosis not present

## 2020-06-29 LAB — BASIC METABOLIC PANEL
BUN: 21 mg/dL (ref 6–23)
CO2: 31 mEq/L (ref 19–32)
Calcium: 9.3 mg/dL (ref 8.4–10.5)
Chloride: 102 mEq/L (ref 96–112)
Creatinine, Ser: 0.76 mg/dL (ref 0.40–1.20)
GFR: 82.28 mL/min (ref >60.00)
Glucose, Bld: 88 mg/dL (ref 70–99)
Potassium: 3.9 mEq/L (ref 3.5–5.1)
Sodium: 141 mEq/L (ref 135–145)

## 2020-06-29 LAB — HEPATIC FUNCTION PANEL
ALT: 65 U/L — ABNORMAL HIGH (ref 0–35)
AST: 18 U/L (ref 0–37)
Albumin: 4.5 g/dL (ref 3.5–5.2)
Alkaline Phosphatase: 56 U/L (ref 39–117)
Bilirubin, Direct: 0.1 mg/dL (ref 0.0–0.3)
Total Bilirubin: 0.8 mg/dL (ref 0.2–1.2)
Total Protein: 6.6 g/dL (ref 6.0–8.3)

## 2020-06-29 LAB — LIPID PANEL
Cholesterol: 204 mg/dL — ABNORMAL HIGH (ref 0–200)
HDL: 59.5 mg/dL (ref >39.00)
NonHDL: 144.29
Total CHOL/HDL Ratio: 3
Triglycerides: 243 mg/dL — ABNORMAL HIGH (ref 0.0–149.0)
VLDL: 48.6 mg/dL — ABNORMAL HIGH (ref 0.0–40.0)

## 2020-06-29 LAB — TSH: TSH: 1.59 u[IU]/mL (ref 0.35–4.50)

## 2020-06-29 LAB — LDL CHOLESTEROL, DIRECT: Direct LDL: 111 mg/dL

## 2020-06-29 NOTE — Assessment & Plan Note (Signed)
Pt is having a difficult time w/ re-entry after COVID.  Is trying to figure out her place/purpose and what amount of exposure she is comfortable with.  She is not overwhelmed or excessively sad, 'just trying to figure stuff out'- which is understandable.  Will continue to follow.

## 2020-06-29 NOTE — Addendum Note (Signed)
Addended by: Genevie Cheshire L on: 06/29/2020 09:38 AM   Modules accepted: Orders

## 2020-06-29 NOTE — Assessment & Plan Note (Signed)
Chronic problem.  Tolerating statin w/o difficulty.  Check labs.  Adjust meds prn  

## 2020-06-29 NOTE — Telephone Encounter (Signed)
LFD 06/17/20 #45 with no refills LOV 06/17/20 NOV 06/29/20

## 2020-06-29 NOTE — Progress Notes (Signed)
   Subjective:    Patient ID: Dominique Harrell Seen, female    DOB: 1955/11/30, 65 y.o.   MRN: [id number]  HPI Hyperlipidemia- chronic problem, on Simvastatin 40mg  daily.  Pt and husband decided to commit to healthy diet.  She is exercising regularly.  Denies CP, SOB, abd pain, N/V.  Depression- pt reports she is 'still trying to figure out where I belong in this whole COVID world'.  Currently retired.  Stopped her part time job due to [employer].  Still anxious about COVID exposure and what to do next.   Review of Systems For ROS see HPI   This visit occurred during the SARS-CoV-2 public health emergency.  Safety protocols were in place, including screening questions prior to the visit, additional usage of staff PPE, and extensive cleaning of exam room while observing appropriate contact time as indicated for disinfecting solutions.       Objective:   Physical Exam Vitals reviewed.  Constitutional:      General: She is not in acute distress.    Appearance: Normal appearance. She is well-developed. She is not ill-appearing.  HENT:     Head: Normocephalic and atraumatic.  Eyes:     Conjunctiva/sclera: Conjunctivae normal.     Pupils: Pupils are equal, round, and reactive to light.  Neck:     Thyroid: No thyromegaly.  Cardiovascular:     Rate and Rhythm: Normal rate and regular rhythm.     Pulses: Normal pulses.     Heart sounds: Normal heart sounds. No murmur heard.   Pulmonary:     Effort: Pulmonary effort is normal. No respiratory distress.     Breath sounds: Normal breath sounds.  Abdominal:     General: There is no distension.     Palpations: Abdomen is soft.     Tenderness: There is no abdominal tenderness.  Musculoskeletal:     Cervical back: Normal range of motion and neck supple.     Right lower leg: No edema.     Left lower leg: No edema.  Lymphadenopathy:     Cervical: No cervical adenopathy.  Skin:    General: Skin is warm and dry.  Neurological:     General: No  focal deficit present.     Mental Status: She is alert and oriented to person, place, and time.  Psychiatric:        Mood and Affect: Mood normal.        Behavior: Behavior normal.        Thought Content: Thought content normal.           Assessment & Plan:

## 2020-06-29 NOTE — Patient Instructions (Signed)
Schedule your complete physical in 6 months We'll notify you of your lab results and make any changes if needed Continue to work on healthy diet and regular exercise- you're doing great! It's ok to feel a little lost right now!  Things are a mess! Call with any questions or concerns Have a great summer!!!

## 2020-06-30 ENCOUNTER — Other Ambulatory Visit: Payer: Self-pay

## 2020-06-30 DIAGNOSIS — R7401 Elevation of levels of liver transaminase levels: Secondary | ICD-10-CM

## 2020-07-02 ENCOUNTER — Encounter: Payer: Self-pay | Admitting: Family Medicine

## 2020-07-16 ENCOUNTER — Other Ambulatory Visit (INDEPENDENT_AMBULATORY_CARE_PROVIDER_SITE_OTHER): Payer: Medicare PPO

## 2020-07-16 ENCOUNTER — Other Ambulatory Visit: Payer: Self-pay

## 2020-07-16 DIAGNOSIS — R7401 Elevation of levels of liver transaminase levels: Secondary | ICD-10-CM | POA: Diagnosis not present

## 2020-07-19 LAB — HEPATIC FUNCTION PANEL
ALT: 25 U/L (ref 0–35)
AST: 22 U/L (ref 0–37)
Albumin: 4.7 g/dL (ref 3.5–5.2)
Alkaline Phosphatase: 40 U/L (ref 39–117)
Bilirubin, Direct: 0.1 mg/dL (ref 0.0–0.3)
Total Bilirubin: 0.5 mg/dL (ref 0.2–1.2)
Total Protein: 6.8 g/dL (ref 6.0–8.3)

## 2020-08-11 ENCOUNTER — Encounter: Payer: Self-pay | Admitting: *Deleted

## 2020-09-08 ENCOUNTER — Ambulatory Visit
Admission: RE | Admit: 2020-09-08 | Discharge: 2020-09-08 | Disposition: A | Discharge status: Home / Self Care | Payer: Medicare PPO | Source: Ambulatory Visit | Attending: Registered Nurse | Admitting: Registered Nurse

## 2020-09-08 ENCOUNTER — Other Ambulatory Visit: Payer: Self-pay

## 2020-09-08 ENCOUNTER — Encounter: Payer: Self-pay | Admitting: Registered Nurse

## 2020-09-08 ENCOUNTER — Ambulatory Visit: Payer: Medicare PPO | Admitting: Registered Nurse

## 2020-09-08 VITALS — BP 117/56 | HR 64 | Temp 98.1°F | Resp 18 | Ht 63.5 in | Wt 116.8 lb

## 2020-09-08 DIAGNOSIS — M810 Age-related osteoporosis without current pathological fracture: Secondary | ICD-10-CM

## 2020-09-08 DIAGNOSIS — M545 Low back pain, unspecified: Secondary | ICD-10-CM | POA: Diagnosis not present

## 2020-09-08 DIAGNOSIS — M542 Cervicalgia: Secondary | ICD-10-CM | POA: Diagnosis not present

## 2020-09-08 DIAGNOSIS — M5416 Radiculopathy, lumbar region: Secondary | ICD-10-CM | POA: Diagnosis not present

## 2020-09-08 DIAGNOSIS — Z780 Asymptomatic menopausal state: Secondary | ICD-10-CM | POA: Diagnosis not present

## 2020-09-08 DIAGNOSIS — M47814 Spondylosis without myelopathy or radiculopathy, thoracic region: Secondary | ICD-10-CM | POA: Diagnosis not present

## 2020-09-08 MED ORDER — MELOXICAM 7.5 MG PO TABS: 7.5000 mg | ORAL_TABLET | Freq: Every day | ORAL | 0 refills | Status: DC

## 2020-09-08 MED ORDER — METHOCARBAMOL 500 MG PO TABS: 500.0000 mg | ORAL_TABLET | Freq: Three times a day (TID) | ORAL | 0 refills | Status: DC | PRN

## 2020-09-08 MED ORDER — KETOROLAC TROMETHAMINE 60 MG/2ML IM SOLN
60.0000 mg | Freq: Once | INTRAMUSCULAR | Status: AC
  Administered 2020-09-08: 60 mg via INTRAMUSCULAR

## 2020-09-08 NOTE — Patient Instructions (Addendum)
Ms. Zetts -  Doristine Devoid to meet you today. Two goals: control pain and image spine  Pain control: Toradol injection in office today- this is an antiinflammatory. I do not expect side effects. It is generally tolerated well. Sending methocarbamol as a muscle relaxer - this may be mildly sedative, use with some caution. Can start today. Tomorrow, can start meloxicam 7.'5mg'$  once daily as antiinflammatory. May upset stomach if taken without water/small snack.  Continue range of motion stretching and mild activity as tolerated. Let me know if headache recurs, neck gets more stiff, you develop intense reaction to bright light, your hands get weak or numb, or any other alarming symptoms start.  Imaging: put in orders for imaging of cervical, thoracic, and lumbar spine to [facility] at [facility]. They can generally do walk ins for xray but I would call to confirm [id number] - before you go to ensure you don't have to wait. Results will populate to your chart, usually within a couple hours after imaging is done. Based on those results, we can discuss further steps.  Thank you  Rich     If you have lab work done today you will be contacted with your lab results within the next 2 weeks.  If you have not heard from [place] then please contact us. The fastest way to get your results is to register for My Chart.   IF you received an x-ray today, you will receive an invoice from [facility]. Please contact [facility] at [phone] with questions or concerns regarding your invoice.   IF you received labwork today, you will receive an invoice from [facility]. Please contact [facility] at [phone] with questions or concerns regarding your invoice.   Our billing staff will not be able to assist you with questions regarding bills from these companies.  You will be contacted with the lab results as soon as they are available. The fastest way to get your results is to activate your My  Chart account. Instructions are located on the last page of this paperwork. If you have not heard from [place] regarding the results in 2 weeks, please contact this office.

## 2020-09-08 NOTE — Progress Notes (Signed)
Acute Office Visit  Subjective:    Patient ID: Dominique Harrell, female    DOB: 01/05/1956, 65 y.o.   MRN: [id number]  Chief Complaint  Patient presents with   Neck Pain    Patient states she has been having some neck pain on left side for about a week. Patient states she tried medication with no relief. Per patient her neck hurts the most when she is sitting or standing up as if her head is too heavy.    HPI Patient is in today for Neck pain L side Onset one week ago, no acute injury or trauma Pain feels deep. L of spine and towards shoulder.  No changes to ROM. Pain worse with neck flexion but otherwise cannot elicit pain Pain worse when standing or sitting - feels like neck is too heavy No R sided pain Did have a headache on Sunday but relieved by tylenol. No photophobia Does not hx of sciatic pain and osteopenia. She expresses concern about djd in c spine.   Past Medical History:  Diagnosis Date   Hyperlipidemia    Insomnia    Migraine    Osteopenia     Past Surgical History:  Procedure Laterality Date   COLONOSCOPY  08/14/2007   w/Dr.Stark   excision of liposarcoma thigh Left    removed at [facility]; drop in Blood pressure per pt   OOPHORECTOMY  1999   left, dermoid cyst    Family History  Problem Relation Age of Onset   Hyperlipidemia Other    Hypertension Other    Diabetes Mother    Hypertension Mother    Parkinsonism Father    Colon cancer Neg Hx    Colon polyps Neg Hx    Esophageal cancer Neg Hx    Rectal cancer Neg Hx    Stomach cancer Neg Hx     Social History   Socioeconomic History   Marital status: Married    Spouse name: Not on file   Number of children: Not on file   Years of education: Not on file   Highest education level: Not on file  Occupational History   Not on file  Tobacco Use   Smoking status: Never   Smokeless tobacco: Never  Vaping Use   Vaping Use: Never used  Substance and Sexual Activity   Alcohol use: Yes     Comment: occ.   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   Social Determinants of Health   Financial Resource Strain: Not on file  Food Insecurity: Not on file  Transportation Needs: Not on file  Physical Activity: Not on file  Stress: Not on file  Social Connections: Not on file  Intimate Partner Violence: Not on file    Outpatient Medications Prior to Visit  Medication Sig Dispense Refill   aspirin 81 MG tablet Take 81 mg by mouth daily.     Calcium Carbonate-Vit D-Min 600-400 MG-UNIT TABS Take 1 tablet by mouth 2 (two) times daily.     fluticasone (FLONASE) 50 MCG/ACT nasal spray SHAKE LIQUID AND USE 2 SPRAYS IN EACH NOSTRIL DAILY 16 g 3   Omega-3 Fatty Acids (FISH OIL CONCENTRATE PO) Take 1 tablet by mouth daily.     simvastatin (ZOCOR) 40 MG tablet TAKE 1 TABLET(40 MG) BY MOUTH DAILY AT 6 PM 90 tablet 1   No  facility-administered medications prior to visit.    Allergies  Allergen Reactions   Penicillins Rash   Tramadol Other (See Comments)    Caused a migraine     Review of Systems  Constitutional: Negative.   HENT: Negative.    Eyes: Negative.   Respiratory: Negative.    Cardiovascular: Negative.   Gastrointestinal: Negative.   Endocrine: Negative.   Genitourinary: Negative.   Musculoskeletal:  Positive for neck pain and neck stiffness. Negative for arthralgias, back pain, gait problem, joint swelling and myalgias.  Skin: Negative.   Allergic/Immunologic: Negative.   Neurological: Negative.   Hematological: Negative.   Psychiatric/Behavioral: Negative.    All other systems reviewed and are negative.     Objective:    Physical Exam Vitals and nursing note reviewed.  Constitutional:      General: She is not in acute distress.    Appearance: Normal appearance. She is not ill-appearing, toxic-appearing or diaphoretic.  Neck:     Vascular: No  carotid bruit.  Cardiovascular:     Rate and Rhythm: Normal rate and regular rhythm.     Pulses: Normal pulses.     Heart sounds: Normal heart sounds. No murmur heard.   No friction rub. No gallop.  Pulmonary:     Effort: Pulmonary effort is normal. No respiratory distress.     Breath sounds: Normal breath sounds. No stridor. No wheezing, rhonchi or rales.  Chest:     Chest wall: No tenderness.  Musculoskeletal:     Cervical back: Normal range of motion and neck supple. Tenderness (mild along L trapezius) present. No rigidity.     Comments: Strength 5/5 bue  Lymphadenopathy:     Cervical: No cervical adenopathy.  Skin:    General: Skin is warm and dry.     Capillary Refill: Capillary refill takes less than 2 seconds.  Neurological:     General: No focal deficit present.     Mental Status: She is alert and oriented to person, place, and time. Mental status is at baseline.     Cranial Nerves: No cranial nerve deficit.  Psychiatric:        Mood and Affect: Mood normal.        Behavior: Behavior normal.        Thought Content: Thought content normal.        Judgment: Judgment normal.    BP (!) 117/56   Pulse 64   Temp 98.1 F (36.7 C) (Temporal)   Resp 18   Ht 5' 3.5" (1.613 m)   Wt 116 lb 12.8 oz (53 kg)   SpO2 99%   BMI 20.37 kg/m  Wt Readings from Last 3 Encounters:  09/08/20 116 lb 12.8 oz (53 kg)  06/29/20 126 lb 12.8 oz (57.5 kg)  06/17/20 126 lb 9.6 oz (57.4 kg)    There are no preventive care reminders to display for this patient.  There are no preventive care reminders to display for this patient.   Lab Results  Component Value Date   TSH 1.59 06/29/2020   Lab Results  Component Value Date   WBC 4.9 01/02/2020   HGB 14.2 01/02/2020   HCT 42.1 01/02/2020   MCV 94.3 01/02/2020   PLT 182.0 01/02/2020   Lab Results  Component Value Date   NA 141 06/29/2020   K 3.9 06/29/2020   CO2 31 06/29/2020   GLUCOSE 88 06/29/2020   BUN 21 06/29/2020    CREATININE 0.76 06/29/2020   BILITOT 0.5 07/16/2020  ALKPHOS 40 07/16/2020   AST 22 07/16/2020   ALT 25 07/16/2020   PROT 6.8 07/16/2020   ALBUMIN 4.7 07/16/2020   CALCIUM 9.3 06/29/2020   GFR 82.28 06/29/2020   Lab Results  Component Value Date   CHOL 204 (H) 06/29/2020   Lab Results  Component Value Date   HDL 59.50 06/29/2020   Lab Results  Component Value Date   LDLCALC 78 01/02/2020   Lab Results  Component Value Date   TRIG 243.0 (H) 06/29/2020   Lab Results  Component Value Date   CHOLHDL 3 06/29/2020   No results found for: HGBA1C     Assessment & Plan:   Problem List Items Addressed This Visit   None    No orders of the defined types were placed in this encounter.  PLAN Muscle spasm vs. Arthritis in spine. Will give antiinflammatories and muscle relaxers as above Xrays ordered. Will follow up based on results. Reviewed supportive care including mild stretching, rest, ice Reviewed reasons to return to clinic including symptoms of meningitis, spinal injury/myelopathy/radiculopathy, and others. Pt voices understanding Based on imaging results can consider PT, ortho, or conservative care. Patient encouraged to call clinic with any questions, comments, or concerns.   Maximiano Coss, NP

## 2020-09-09 ENCOUNTER — Encounter: Payer: Self-pay | Admitting: Registered Nurse

## 2020-09-13 ENCOUNTER — Other Ambulatory Visit: Payer: Self-pay

## 2020-09-13 ENCOUNTER — Encounter: Payer: Self-pay | Admitting: Registered Nurse

## 2020-09-13 DIAGNOSIS — M542 Cervicalgia: Secondary | ICD-10-CM

## 2020-09-13 NOTE — Telephone Encounter (Signed)
Ok to refer, can [employer],  Writer

## 2020-09-17 ENCOUNTER — Encounter (HOSPITAL_BASED_OUTPATIENT_CLINIC_OR_DEPARTMENT_OTHER): Payer: Self-pay | Admitting: Physical Therapy

## 2020-09-17 ENCOUNTER — Other Ambulatory Visit: Payer: Self-pay

## 2020-09-17 ENCOUNTER — Ambulatory Visit (HOSPITAL_BASED_OUTPATIENT_CLINIC_OR_DEPARTMENT_OTHER): Payer: Medicare PPO | Attending: Registered Nurse | Admitting: Physical Therapy

## 2020-09-17 DIAGNOSIS — M542 Cervicalgia: Secondary | ICD-10-CM | POA: Diagnosis not present

## 2020-09-17 DIAGNOSIS — R293 Abnormal posture: Secondary | ICD-10-CM | POA: Insufficient documentation

## 2020-09-17 NOTE — Therapy (Signed)
[place], [place], [address] Phone: [phone]   Fax:  [phone]  Physical Therapy Evaluation  Patient Details  Name: Dominique Harrell MRN: [id number] Date of Birth: [birth year] Referring Provider (PT): Maximiano Coss, NP   Encounter Date: 09/17/2020   PT End of Session - 09/17/20 0844     Visit Number 1    Number of Visits 7    Date for PT Re-Evaluation 10/08/20    Authorization Type [employer] MCR    PT Start Time 0840    PT Stop Time 0920    PT Time Calculation (min) 40 min    Activity Tolerance Patient tolerated treatment well    Behavior During Therapy [facility] for tasks assessed/performed             Past Medical History:  Diagnosis Date   Hyperlipidemia    Insomnia    Migraine    Osteopenia     Past Surgical History:  Procedure Laterality Date   COLONOSCOPY  08/14/2007   w/Dr.Stark   excision of liposarcoma thigh Left    removed at [facility]; drop in Blood pressure per pt   OOPHORECTOMY  1999   left, dermoid cyst    There were no vitals filed for this visit.    Subjective Assessment - 09/17/20 0844     Subjective Chronic intermittent aches and pain and two weeks ago it became excruciating. [place] the beach and was a little achey but woke up one morning feeling like my head was too heavy. Denies N/T or vision changes.    Patient Stated Goals what exercises to do, how to sit, avoid return of pain    Currently in Pain? No/denies                [facility] PT Assessment - 09/17/20 0001       Assessment   Medical Diagnosis Lt-sided neck pain    Referring Provider (PT) Maximiano Coss, NP    Hand Dominance Right;Left    Prior Therapy low back      Precautions   Precautions None      Restrictions   Weight Bearing Restrictions No      Balance Screen   Has the patient fallen in the past 6 months No      [place] residence    Living  Arrangements Spouse/significant other    Additional Comments stairs in home      Prior Function   Level of [place] Retired      Associate Professor   Overall Cognitive Status Within Functional Limits for tasks assessed      Sensation   Additional Comments [facility]      Posture/Postural Control   Posture Comments decr thoracic kyphosis, mild fwd rounded GHJ bil      ROM / Strength   AROM / PROM / Strength AROM;Strength      AROM   Overall AROM Comments biil GHJ WFL    AROM Assessment Site Cervical;Shoulder    Cervical Flexion 40    Cervical - Right Side Bend 24    Cervical - Left Side Bend 36    Cervical - Right Rotation 45    Cervical - Left Rotation 50      Strength   Overall Strength Comments bil GHJ 5/5    Strength Assessment Site Shoulder    Right/Left Shoulder Right;Left      Palpation  Palpation comment elevated Lt clavicle and first rib                        Objective measurements completed on examination: See above findings.       [place] Adult PT Treatment/Exercise - 09/17/20 0001       Exercises   Exercises Neck      Neck Exercises: Seated   Other Seated Exercise scap retraction+ post pelvic tilt      Neck Exercises: Stretches   Upper Trapezius Stretch Limitations seated    Levator Stretch Limitations seated    Other Neck Stretches supine chest stretch, open book    Other Neck Stretches AROM cervical rotation      Manual Therapy   Manual Therapy Soft tissue mobilization;Joint mobilization    Joint Mobilization Lt first rib depression gr 4    Soft tissue mobilization Lt upper trap, levator                    PT Education - 09/17/20 1318     Education Details anatomy of condition, POC, HEP, exercise form/rationale, sleeping & driving posture support    Person(s) Educated Patient    Methods Explanation;Demonstration;Tactile cues;Verbal cues;Handout    Comprehension Verbalized understanding;Returned  demonstration;Verbal cues required;Tactile cues required;Need further instruction                 PT Long Term Goals - 09/17/20 1315       PT LONG TERM GOAL #1   Title Equal cervical ROM in sidebending    Baseline see flowsheet    Time 3    Period Weeks    Status New    Target Date 10/08/20      PT LONG TERM GOAL #2   Title able to drive without incr neck pain    Baseline pain at eval    Time 3    Period Weeks    Status New    Target Date 10/08/20      PT LONG TERM GOAL #3   Title able to play with her grand children without limitation    Baseline significant limitations when pain levels are high    Time 3    Period Weeks    Status New    Target Date 10/08/20      PT LONG TERM GOAL #4   Title Pt will be I in long term HEP for continued care of spine    Baseline will progress and establish as appropriate    Time 3    Period Weeks    Status New    Target Date 10/08/20                    Plan - 09/17/20 [id number]     Clinical Impression Statement Pt presents to PT with complaints of acute on chronic neck pain. Spasm in Lt upper trap resulting in poor flexibility and mobility. Began with HEP to encourage proper posture and flexibility. Pt will benefit from skilled PT to address deficits and reach functional goals.    Personal Factors and Comorbidities Time since onset of injury/illness/exacerbation;Comorbidity 1    Comorbidities h/o migraines    Examination-Activity Limitations Bend;Sit;Sleep;Carry;Stand;Lift    Examination-Participation Restrictions Meal Prep;Community Activity;Driving    Stability/Clinical Decision Making Evolving/Moderate complexity    Clinical Decision Making Low    Rehab Potential Good    PT Frequency 2x / week    PT Duration 3 weeks  PT Treatment/Interventions ADLs/Self Care Home Management;Aquatic Therapy;Cryotherapy;Electrical Stimulation;Ultrasound;Traction;Moist Heat;Iontophoresis '4mg'$ /ml Dexamethasone;Functional mobility  training;Therapeutic activities;Therapeutic exercise;Neuromuscular re-education;Manual techniques;Patient/family education;Passive range of motion;Dry needling;Taping    PT Next Visit Plan DN, periscap strength, core activation    PT Home Exercise Plan [id number]    Consulted and Agree with Plan of Care Patient             Patient will benefit from skilled therapeutic intervention in order to improve the following deficits and impairments:  Decreased range of motion, Increased muscle spasms, Decreased activity tolerance, Pain, Impaired flexibility, Postural dysfunction  Visit Diagnosis: Cervicalgia - Plan: PT plan of care cert/re-cert  Abnormal posture - Plan: PT plan of care cert/re-cert     Problem List Patient Active Problem List   Diagnosis Date Noted   Lentigo 06/17/2020   Melanocytic nevi of trunk 06/17/2020   Other seborrheic keratosis 06/17/2020   Lumbar back pain with radiculopathy affecting right lower extremity 04/07/2015   Osteoporosis, 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 Lt upper trap spasm Treatment diagnosis? (if different than referring diagnosis) same as referring What was this (referring dx) caused by? '[]'$  Surgery '[]'$  Fall '[x]'$  Ongoing issue '[]'$  Arthritis '[]'$  Other: ____________  Laterality: '[]'$  Rt '[x]'$  Lt '[]'$  Both  Check all possible CPT codes:      '[x]'$  97110 (Therapeutic Exercise)  '[]'$  92507 (SLP Treatment)  '[x]'$  97112 (Neuro Re-ed)   '[]'$  92526 (Swallowing Treatment)   '[]'$  97116 (Gait Training)   '[]'$  [id number] (Cognitive Training, 1st 15 minutes) '[x]'$  97140 (Manual Therapy)   '[]'$  97130 (Cognitive Training, each add'l 15 minutes)  '[x]'$  97530 (Therapeutic Activities)  '[]'$  Other, List CPT Code ____________     '[x]'$  [id number] (Self Care)       '[]'$  All codes above (97110 - 97535)  '[x]'$  97012 (Mechanical Traction)  '[x]'$  97014 (E-stim Unattended)  '[]'$  97032 (E-stim manual)  '[x]'$  97033 (Ionto)  '[x]'$  97035 (Ultrasound)  '[]'$  97760 (Orthotic Fit) '[]'$  97750 (Physical Performance Training) '[x]'$  [id number] (Aquatic Therapy) '[]'$  97034 (Contrast Bath) '[]'$  [id number] (Paraffin) '[]'$  97597 (Wound Care 1st 20 sq cm) '[]'$  97598 (Wound Care each add'l 20 sq cm) '[]'$  97016 (Vasopneumatic Device) '[]'$  [id number] Comptroller) '[]'$  [phone] (Prosthetic Training)   Gay Filler. Jersi Mcmaster PT, DPT 09/17/20 1:21 PM  [facility] [address] [place], [place], [address] Phone: [phone]   Fax:  [phone]  Name: Dominique Harrell MRN: [id number] Date of Birth: Nov 04, 1955

## 2020-09-20 ENCOUNTER — Other Ambulatory Visit: Payer: Self-pay

## 2020-09-20 ENCOUNTER — Encounter (HOSPITAL_BASED_OUTPATIENT_CLINIC_OR_DEPARTMENT_OTHER): Payer: Self-pay | Admitting: Physical Therapy

## 2020-09-20 ENCOUNTER — Ambulatory Visit (HOSPITAL_BASED_OUTPATIENT_CLINIC_OR_DEPARTMENT_OTHER): Payer: Medicare PPO | Admitting: Physical Therapy

## 2020-09-20 DIAGNOSIS — M542 Cervicalgia: Secondary | ICD-10-CM | POA: Diagnosis not present

## 2020-09-20 DIAGNOSIS — R293 Abnormal posture: Secondary | ICD-10-CM

## 2020-09-20 NOTE — Therapy (Signed)
[place], [place], [address] Phone: [phone]   Fax:  [phone]  Physical Therapy Treatment  Patient Details  Name: Dominique Harrell MRN: [id number] Date of Birth: [birth year] Referring Provider (PT): Maximiano Coss, NP   Encounter Date: 09/20/2020   PT End of Session - 09/20/20 0853     Visit Number 2    Number of Visits 7    Date for PT Re-Evaluation 10/08/20    Authorization Type [employer] MCR    PT Start Time 0850    PT Stop Time 0929    PT Time Calculation (min) 39 min    Activity Tolerance Patient tolerated treatment well    Behavior During Therapy [facility] for tasks assessed/performed             Past Medical History:  Diagnosis Date   Hyperlipidemia    Insomnia    Migraine    Osteopenia     Past Surgical History:  Procedure Laterality Date   COLONOSCOPY  08/14/2007   w/Dr.Stark   excision of liposarcoma thigh Left    removed at [facility]; drop in Blood pressure per pt   OOPHORECTOMY  1999   left, dermoid cyst    There were no vitals filed for this visit.   Subjective Assessment - 09/20/20 0852     Subjective Denies excruciating pain, just achey.    Currently in Pain? No/denies                               [facility] Adult PT Treatment/Exercise - 09/20/20 0001       Neck Exercises: Sidelying   Other Sidelying Exercise Rt sidelying over pillow- breathing for Lt post rib cage expansion      Neck Exercises: Prone   Other Prone Exercise PT provided pressure to Lt upper trap, Rt post ribs, Lt SIJ- breathing to expand Lt post rib cage              Trigger Point Dry Needling - 09/20/20 0001     Consent Given? Yes    Muscles Treated Head and Neck Upper trapezius    Upper Trapezius Response Twitch reponse elicited;Palpable increased muscle length                       PT Long Term Goals - 09/17/20 1315       PT LONG TERM GOAL #1   Title  Equal cervical ROM in sidebending    Baseline see flowsheet    Time 3    Period Weeks    Status New    Target Date 10/08/20      PT LONG TERM GOAL #2   Title able to drive without incr neck pain    Baseline pain at eval    Time 3    Period Weeks    Status New    Target Date 10/08/20      PT LONG TERM GOAL #3   Title able to play with her grand children without limitation    Baseline significant limitations when pain levels are high    Time 3    Period Weeks    Status New    Target Date 10/08/20      PT LONG TERM GOAL #4   Title Pt will be I in long term HEP for continued care of spine    Baseline will progress and establish  as appropriate    Time 3    Period Weeks    Status New    Target Date 10/08/20                   Plan - 09/20/20 1309     Clinical Impression Statement reduced tension in Lt upper trap with DN. Notable thoraco dextroscoliosis in prone resulting in poor expansion of post Lt rib cage and overactivation of Lt upper trap. Utilized pillow support for compression of Rt lower rib cage in assistance of obliques. will cont to utilize postural respiration proper mobility/stability.    PT Treatment/Interventions ADLs/Self Care Home Management;Aquatic Therapy;Cryotherapy;Electrical Stimulation;Ultrasound;Traction;Moist Heat;Iontophoresis '4mg'$ /ml Dexamethasone;Functional mobility training;Therapeutic activities;Therapeutic exercise;Neuromuscular re-education;Manual techniques;Patient/family education;Passive range of motion;Dry needling;Taping    PT Next Visit Plan rev sidelying rib cage exercise & progress    PT Home Exercise Plan [id number]    Consulted and Agree with Plan of Care Patient             Patient will benefit from skilled therapeutic intervention in order to improve the following deficits and impairments:  Decreased range of motion, Increased muscle spasms, Decreased activity tolerance, Pain, Impaired flexibility, Postural dysfunction  Visit  Diagnosis: Cervicalgia  Abnormal posture     Problem List Patient Active Problem List   Diagnosis Date Noted   Lentigo 06/17/2020   Melanocytic nevi of trunk 06/17/2020   Other seborrheic keratosis 06/17/2020   Lumbar back pain with radiculopathy affecting right lower extremity 04/07/2015   Osteoporosis, 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   General medical examination 03/02/2011   SHOULDER IMPINGEMENT SYNDROME, LEFT 02/15/2010   NEOPLASM OF UNCERTAIN BEHAVIOR OF SKIN 12/02/2009   Hyperlipidemia 06/08/2006   Depression 06/08/2006   MIGRAINE HEADACHE 06/08/2006   INSOMNIA 06/08/2006   Deklynn Charlet C. Raynard Mapps PT, DPT 09/20/20 1:13 PM   [facility] [address] [place], [place], [address] Phone: [phone]   Fax:  [phone]  Name: Dominique Harrell MRN: [id number] Date of Birth: 1955/09/15

## 2020-09-24 ENCOUNTER — Ambulatory Visit (HOSPITAL_BASED_OUTPATIENT_CLINIC_OR_DEPARTMENT_OTHER): Payer: Medicare PPO | Admitting: Physical Therapy

## 2020-09-24 ENCOUNTER — Other Ambulatory Visit: Payer: Self-pay

## 2020-09-24 ENCOUNTER — Encounter (HOSPITAL_BASED_OUTPATIENT_CLINIC_OR_DEPARTMENT_OTHER): Payer: Self-pay | Admitting: Physical Therapy

## 2020-09-24 DIAGNOSIS — M542 Cervicalgia: Secondary | ICD-10-CM

## 2020-09-24 DIAGNOSIS — R293 Abnormal posture: Secondary | ICD-10-CM

## 2020-09-24 NOTE — Therapy (Signed)
[place], [place], [address] Phone: [phone]   Fax:  [phone]  Physical Therapy Treatment  Patient Details  Name: Dominique Harrell MRN: [id number] Date of Birth: 1955/11/08 Referring Provider (PT): Maximiano Coss, NP   Encounter Date: 09/24/2020   PT End of Session - 09/24/20 0844     Visit Number 3    Number of Visits 7    Date for PT Re-Evaluation 10/08/20    Authorization Type [employer] MCR    PT Start Time 0834    PT Stop Time 0922    PT Time Calculation (min) 48 min    Activity Tolerance Patient tolerated treatment well    Behavior During Therapy [facility] for tasks assessed/performed             Past Medical History:  Diagnosis Date   Hyperlipidemia    Insomnia    Migraine    Osteopenia     Past Surgical History:  Procedure Laterality Date   COLONOSCOPY  08/14/2007   w/Dr.Stark   excision of liposarcoma thigh Left    removed at [facility]; drop in Blood pressure per pt   OOPHORECTOMY  1999   left, dermoid cyst    There were no vitals filed for this visit.   Subjective Assessment - 09/24/20 0843     Subjective Just some aching.    Patient Stated Goals what exercises to do, how to sit, avoid return of pain    Currently in Pain? No/denies                [facility] PT Assessment - 09/24/20 0001       AROM   Cervical - Right Side Bend 26    Cervical - Left Side Bend 32    Cervical - Right Rotation 56    Cervical - Left Rotation 50                           [facility] Adult PT Treatment/Exercise - 09/24/20 0001       Neck Exercises: Supine   Other Supine Exercise chin tuck to curl up    Other Supine Exercise ab set with breathing & upper trap inhibition- prog to TT      Neck Exercises: Sidelying   Other Sidelying Exercise Rt sidelying over pillow- breathing for Lt post rib cage expansion   added Lt arm reach &core anchor     Manual Therapy   Joint  Mobilization Rt hip cage gross ER, holding Lt first rib- Rt post rib cage-Lt SIJ while pt inhales    Soft tissue mobilization Rt thoracic paraspinals- T3-T8; Lt upper trap                         PT Long Term Goals - 09/17/20 1315       PT LONG TERM GOAL #1   Title Equal cervical ROM in sidebending    Baseline see flowsheet    Time 3    Period Weeks    Status New    Target Date 10/08/20      PT LONG TERM GOAL #2   Title able to drive without incr neck pain    Baseline pain at eval    Time 3    Period Weeks    Status New    Target Date 10/08/20      PT LONG TERM GOAL #3  Title able to play with her grand children without limitation    Baseline significant limitations when pain levels are high    Time 3    Period Weeks    Status New    Target Date 10/08/20      PT LONG TERM GOAL #4   Title Pt will be I in long term HEP for continued care of spine    Baseline will progress and establish as appropriate    Time 3    Period Weeks    Status New    Target Date 10/08/20                   Plan - 09/24/20 0926     Clinical Impression Statement prog core activation for Lt post rib cage expansion in sidelying by adding UE reach. difficulty with chin tuck+ab set to avoid activating upper traps.    PT Treatment/Interventions ADLs/Self Care Home Management;Aquatic Therapy;Cryotherapy;Electrical Stimulation;Ultrasound;Traction;Moist Heat;Iontophoresis '4mg'$ /ml Dexamethasone;Functional mobility training;Therapeutic activities;Therapeutic exercise;Neuromuscular re-education;Manual techniques;Patient/family education;Passive range of motion;Dry needling;Taping    PT Next Visit Plan cont to progress activation of chin tuck+ core, try side plank    PT Home Exercise Plan [id number]    Consulted and Agree with Plan of Care Patient             Patient will benefit from skilled therapeutic intervention in order to improve the following deficits and impairments:   Decreased range of motion, Increased muscle spasms, Decreased activity tolerance, Pain, Impaired flexibility, Postural dysfunction  Visit Diagnosis: Cervicalgia  Abnormal posture     Problem List Patient Active Problem List   Diagnosis Date Noted   Lentigo 06/17/2020   Melanocytic nevi of trunk 06/17/2020   Other seborrheic keratosis 06/17/2020   Lumbar back pain with radiculopathy affecting right lower extremity 04/07/2015   Osteoporosis, 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   General medical examination 03/02/2011   SHOULDER IMPINGEMENT SYNDROME, LEFT 02/15/2010   NEOPLASM OF UNCERTAIN BEHAVIOR OF SKIN 12/02/2009   Hyperlipidemia 06/08/2006   Depression 06/08/2006   MIGRAINE HEADACHE 06/08/2006   INSOMNIA 06/08/2006    Carlos Heber C. Shaelin Lalley PT, DPT 09/24/20 9:29 AM   [place], [place], [address] Phone: [phone]   Fax:  [phone]  Name: Dominique Harrell MRN: [id number] Date of Birth: 1955/12/01

## 2020-09-28 ENCOUNTER — Ambulatory Visit (HOSPITAL_BASED_OUTPATIENT_CLINIC_OR_DEPARTMENT_OTHER): Payer: Medicare PPO | Admitting: Physical Therapy

## 2020-09-28 ENCOUNTER — Other Ambulatory Visit: Payer: Self-pay

## 2020-09-28 ENCOUNTER — Encounter (HOSPITAL_BASED_OUTPATIENT_CLINIC_OR_DEPARTMENT_OTHER): Payer: Self-pay | Admitting: Physical Therapy

## 2020-09-28 DIAGNOSIS — R293 Abnormal posture: Secondary | ICD-10-CM

## 2020-09-28 DIAGNOSIS — M542 Cervicalgia: Secondary | ICD-10-CM

## 2020-09-28 NOTE — Therapy (Signed)
[place], [place], [address] Phone: [phone]   Fax:  [phone]  Physical Therapy Treatment  Patient Details  Name: Dominique Harrell MRN: [id number] Date of Birth: 20-Jul-1955 Referring Provider (PT): Maximiano Coss, NP   Encounter Date: 09/28/2020   PT End of Session - 09/28/20 0802     Visit Number 4    Number of Visits 7    Date for PT Re-Evaluation 10/08/20    Authorization Type [employer] MCR    PT Start Time 0800    PT Stop Time 0843    PT Time Calculation (min) 43 min    Activity Tolerance Patient tolerated treatment well    Behavior During Therapy [facility] for tasks assessed/performed             Past Medical History:  Diagnosis Date   Hyperlipidemia    Insomnia    Migraine    Osteopenia     Past Surgical History:  Procedure Laterality Date   COLONOSCOPY  08/14/2007   w/Dr.Stark   excision of liposarcoma thigh Left    removed at [facility]; drop in Blood pressure per pt   OOPHORECTOMY  1999   left, dermoid cyst    There were no vitals filed for this visit.   Subjective Assessment - 09/28/20 0802     Subjective I think I did one of the exercises incoorectly.    Patient Stated Goals what exercises to do, how to sit, avoid return of pain                [facility] PT Assessment - 09/28/20 0001       AROM   Cervical - Right Side Bend 30    Cervical - Left Side Bend 32    Cervical - Right Rotation 52    Cervical - Left Rotation 52                           [facility] Adult PT Treatment/Exercise - 09/28/20 0001       Neck Exercises: Machines for Strengthening   UBE (Upper Arm Bike) 2 min retro L1      Neck Exercises: Theraband   Shoulder Extension 20 reps;Green    Shoulder Extension Limitations with mini squat    Rows 20 reps;Blue    Rows Limitations with mini squat    Shoulder External Rotation 20 reps    Shoulder External Rotation Limitations each, towel  under arm      Neck Exercises: Standing   Wall Push Ups 15 reps    Other Standing Exercises wall squat + biceps curls 3lb- supinated & neutral      Manual Therapy   Joint Mobilization lateral cervical joint mobs bilat    Soft tissue mobilization bil upper trap, cervical rotation with inf/post pressure on contralateral shoulder                         PT Long Term Goals - 09/17/20 1315       PT LONG TERM GOAL #1   Title Equal cervical ROM in sidebending    Baseline see flowsheet    Time 3    Period Weeks    Status New    Target Date 10/08/20      PT LONG TERM GOAL #2   Title able to drive without incr neck pain    Baseline pain at eval  Time 3    Period Weeks    Status New    Target Date 10/08/20      PT LONG TERM GOAL #3   Title able to play with her grand children without limitation    Baseline significant limitations when pain levels are high    Time 3    Period Weeks    Status New    Target Date 10/08/20      PT LONG TERM GOAL #4   Title Pt will be I in long term HEP for continued care of spine    Baseline will progress and establish as appropriate    Time 3    Period Weeks    Status New    Target Date 10/08/20                   Plan - 09/28/20 0904     Clinical Impression Statement ROM continues to equalize from Rt to left in rotation and sidebend. Pt reports non-painful crepitus in cervical rotation that I was not able to feel in passive mobility. Will cont to benefit from manual therapy to decr abnormal pressure through cervical region and retrain coordination of trunk/scapular stability.    PT Treatment/Interventions ADLs/Self Care Home Management;Aquatic Therapy;Cryotherapy;Electrical Stimulation;Ultrasound;Traction;Moist Heat;Iontophoresis '4mg'$ /ml Dexamethasone;Functional mobility training;Therapeutic activities;Therapeutic exercise;Neuromuscular re-education;Manual techniques;Patient/family education;Passive range of motion;Dry  needling;Taping    PT Next Visit Plan cont to progress activation of chin tuck+ core, try side plank    PT Home Exercise Plan [id number]    Consulted and Agree with Plan of Care Patient             Patient will benefit from skilled therapeutic intervention in order to improve the following deficits and impairments:  Decreased range of motion, Increased muscle spasms, Decreased activity tolerance, Pain, Impaired flexibility, Postural dysfunction  Visit Diagnosis: Cervicalgia  Abnormal posture     Problem List Patient Active Problem List   Diagnosis Date Noted   Lentigo 06/17/2020   Melanocytic nevi of trunk 06/17/2020   Other seborrheic keratosis 06/17/2020   Lumbar back pain with radiculopathy affecting right lower extremity 04/07/2015   Osteoporosis, 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   General medical examination 03/02/2011   SHOULDER IMPINGEMENT SYNDROME, LEFT 02/15/2010   NEOPLASM OF UNCERTAIN BEHAVIOR OF SKIN 12/02/2009   Hyperlipidemia 06/08/2006   Depression 06/08/2006   MIGRAINE HEADACHE 06/08/2006   INSOMNIA 06/08/2006   Cohan Stipes C. Shalik Sanfilippo PT, DPT 09/28/20 9:07 AM   [facility] [place], [place], [address] Phone: [phone]   Fax:  [phone]  Name: Dominique Harrell MRN: [id number] Date of Birth: 03/01/1955

## 2020-09-30 ENCOUNTER — Other Ambulatory Visit: Payer: Self-pay

## 2020-09-30 ENCOUNTER — Ambulatory Visit (HOSPITAL_BASED_OUTPATIENT_CLINIC_OR_DEPARTMENT_OTHER): Payer: Medicare PPO | Admitting: Physical Therapy

## 2020-09-30 ENCOUNTER — Encounter (HOSPITAL_BASED_OUTPATIENT_CLINIC_OR_DEPARTMENT_OTHER): Payer: Self-pay | Admitting: Physical Therapy

## 2020-09-30 DIAGNOSIS — M542 Cervicalgia: Secondary | ICD-10-CM | POA: Diagnosis not present

## 2020-09-30 DIAGNOSIS — R293 Abnormal posture: Secondary | ICD-10-CM | POA: Diagnosis not present

## 2020-09-30 NOTE — Therapy (Signed)
[place], [place], [address] Phone: [phone]   Fax:  [phone]  Physical Therapy Treatment  Patient Details  Name: Dominique Harrell MRN: [id number] Date of Birth: Sep 27, 1955 Referring Provider (PT): Maximiano Coss, NP   Encounter Date: 09/30/2020   PT End of Session - 09/30/20 1405     Visit Number 5    Number of Visits 7    Date for PT Re-Evaluation 10/08/20    Authorization Type [employer] MCR    PT Start Time 1310    PT Stop Time [id number]    PT Time Calculation (min) 42 min    Activity Tolerance Patient tolerated treatment well    Behavior During Therapy [facility] for tasks assessed/performed             Past Medical History:  Diagnosis Date   Hyperlipidemia    Insomnia    Migraine    Osteopenia     Past Surgical History:  Procedure Laterality Date   COLONOSCOPY  08/14/2007   w/Dr.Stark   excision of liposarcoma thigh Left    removed at [facility]; drop in Blood pressure per pt   OOPHORECTOMY  1999   left, dermoid cyst    There were no vitals filed for this visit.   Subjective Assessment - 09/30/20 1311     Subjective A little midline pain in the neck and some LBP. no longer feeling like head is too heavy.    Patient Stated Goals what exercises to do, how to sit, avoid return of pain    Currently in Pain? Yes    Pain Score 2     Pain Location Neck    Pain Orientation Mid    Pain Descriptors / Indicators Sore    Aggravating Factors  hiking shoulders with exercises, possibly?    Pain Relieving Factors rest                               [facility] Adult PT Treatment/Exercise - 09/30/20 0001       Neck Exercises: Standing   Other Standing Exercises tband: ER, straight arm pull down, triceps ext    Other Standing Exercises long axis flexion, scaption, abd, wide row with 1lb      Neck Exercises: Stretches   Upper Trapezius Stretch Limitations review and added shee  for first rib mob    Other Neck Stretches cervical rotation with cues for posture      Manual Therapy   Manual Therapy Taping    Joint Mobilization Lt first rib depression    Soft tissue mobilization bil upper trap, xuboccipitals, levator, cervical paraspinals    Kinesiotex Facilitate Muscle      Kinesiotix   Facilitate Muscle  scap retraction + depression                         PT Long Term Goals - 09/17/20 1315       PT LONG TERM GOAL #1   Title Equal cervical ROM in sidebending    Baseline see flowsheet    Time 3    Period Weeks    Status New    Target Date 10/08/20      PT LONG TERM GOAL #2   Title able to drive without incr neck pain    Baseline pain at eval    Time 3    Period Weeks  Status New    Target Date 10/08/20      PT LONG TERM GOAL #3   Title able to play with her grand children without limitation    Baseline significant limitations when pain levels are high    Time 3    Period Weeks    Status New    Target Date 10/08/20      PT LONG TERM GOAL #4   Title Pt will be I in long term HEP for continued care of spine    Baseline will progress and establish as appropriate    Time 3    Period Weeks    Status New    Target Date 10/08/20                   Plan - 09/30/20 1403     Clinical Impression Statement Pt reported that today she was able to notice the most noticable decr in tension following treatment. Is able to verbalize sensation of the "right v wrong" motion of her shoulders. Ktape placed for tactile cuing.    PT Treatment/Interventions ADLs/Self Care Home Management;Aquatic Therapy;Cryotherapy;Electrical Stimulation;Ultrasound;Traction;Moist Heat;Iontophoresis '4mg'$ /ml Dexamethasone;Functional mobility training;Therapeutic activities;Therapeutic exercise;Neuromuscular re-education;Manual techniques;Patient/family education;Passive range of motion;Dry needling;Taping    PT Next Visit Plan cont to progress activation of  chin tuck+ core, try side plank    PT Home Exercise Plan [id number]    Consulted and Agree with Plan of Care Patient             Patient will benefit from skilled therapeutic intervention in order to improve the following deficits and impairments:  Decreased range of motion, Increased muscle spasms, Decreased activity tolerance, Pain, Impaired flexibility, Postural dysfunction  Visit Diagnosis: Cervicalgia  Abnormal posture     Problem List Patient Active Problem List   Diagnosis Date Noted   Lentigo 06/17/2020   Melanocytic nevi of trunk 06/17/2020   Other seborrheic keratosis 06/17/2020   Lumbar back pain with radiculopathy affecting right lower extremity 04/07/2015   Osteoporosis, 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   General medical examination 03/02/2011   SHOULDER IMPINGEMENT SYNDROME, LEFT 02/15/2010   NEOPLASM OF UNCERTAIN BEHAVIOR OF SKIN 12/02/2009   Hyperlipidemia 06/08/2006   Depression 06/08/2006   MIGRAINE HEADACHE 06/08/2006   INSOMNIA 06/08/2006  W[place] C. Shyrl Obi PT, DPT 09/30/20 2:06 PM   [facility] [address] [place], [place], [address] Phone: [phone]   Fax:  [phone]  Name: MARISEL REBERT MRN: [id number] Date of Birth: [birth year]

## 2020-10-05 ENCOUNTER — Other Ambulatory Visit: Payer: Self-pay

## 2020-10-05 ENCOUNTER — Ambulatory Visit (HOSPITAL_BASED_OUTPATIENT_CLINIC_OR_DEPARTMENT_OTHER): Payer: Medicare PPO | Admitting: Physical Therapy

## 2020-10-05 ENCOUNTER — Other Ambulatory Visit: Payer: Self-pay | Admitting: Registered Nurse

## 2020-10-05 DIAGNOSIS — M542 Cervicalgia: Secondary | ICD-10-CM | POA: Diagnosis not present

## 2020-10-05 DIAGNOSIS — R293 Abnormal posture: Secondary | ICD-10-CM | POA: Diagnosis not present

## 2020-10-05 NOTE — Therapy (Signed)
[place], [place], [address] Phone: [phone]   Fax:  [phone]  Physical Therapy Treatment  Patient Details  Name: Dominique Harrell MRN: [id number] Date of Birth: [birth year] Referring Provider (PT): Maximiano Coss, NP   Encounter Date: 10/05/2020   PT End of Session - 10/05/20 0844     Visit Number 6    Number of Visits 7    Date for PT Re-Evaluation 10/08/20    Authorization Type [employer] MCR    PT Start Time 0840    PT Stop Time 0927    PT Time Calculation (min) 47 min    Activity Tolerance Patient tolerated treatment well    Behavior During Therapy [facility] for tasks assessed/performed             Past Medical History:  Diagnosis Date   Hyperlipidemia    Insomnia    Migraine    Osteopenia     Past Surgical History:  Procedure Laterality Date   COLONOSCOPY  08/14/2007   w/Dr.Stark   excision of liposarcoma thigh Left    removed at [facility]; drop in Blood pressure per pt   OOPHORECTOMY  1999   left, dermoid cyst    There were no vitals filed for this visit.   Subjective Assessment - 10/05/20 0842     Subjective I had a rough weekend, I think it was the adding of resistance bands. I did it last night and feel fine this morning.    Patient Stated Goals what exercises to do, how to sit, avoid return of pain    Currently in Pain? Yes    Pain Score 4     Pain Location Shoulder    Pain Orientation Left    Pain Descriptors / Indicators Sore    Aggravating Factors  resistance bands incr soreness    Pain Relieving Factors stretching                [facility] PT Assessment - 10/05/20 0001       ROM / Strength   AROM / PROM / Strength PROM      AROM   Cervical - Right Side Bend 24    Cervical - Left Side Bend 22      PROM   Overall PROM Comments cervical sidebend: 34 bil following manual therapy                           [facility] Adult PT Treatment/Exercise -  10/05/20 0001       Neck Exercises: Supine   Neck Retraction Limitations chin tuck+lift      Neck Exercises: Prone   Other Prone Exercise 1lb- ext, horiz abd, scaption, flexion, row, ER      Manual Therapy   Manual therapy comments cervical PROM rotation and SB stretching    Joint Mobilization lower cervical Rt to Lt and PA    Soft tissue mobilization bil upper trap, Lt levator scap                         PT Long Term Goals - 09/17/20 1315       PT LONG TERM GOAL #1   Title Equal cervical ROM in sidebending    Baseline see flowsheet    Time 3    Period Weeks    Status New    Target Date 10/08/20      PT  LONG TERM GOAL #2   Title able to drive without incr neck pain    Baseline pain at eval    Time 3    Period Weeks    Status New    Target Date 10/08/20      PT LONG TERM GOAL #3   Title able to play with her grand children without limitation    Baseline significant limitations when pain levels are high    Time 3    Period Weeks    Status New    Target Date 10/08/20      PT LONG TERM GOAL #4   Title Pt will be I in long term HEP for continued care of spine    Baseline will progress and establish as appropriate    Time 3    Period Weeks    Status New    Target Date 10/08/20                   Plan - 10/05/20 0933     Clinical Impression Statement Tightness reduced with manual therapy and incr cervical SB by 10 deg bilaterally. Pt will try to bring husband in for [facility] education. prone GHj/periscap strengthening for controlled and targeted motion- tactile and verbal cues required for form.    PT Treatment/Interventions ADLs/Self Care Home Management;Aquatic Therapy;Cryotherapy;Electrical Stimulation;Ultrasound;Traction;Moist Heat;Iontophoresis '4mg'$ /ml Dexamethasone;Functional mobility training;Therapeutic activities;Therapeutic exercise;Neuromuscular re-education;Manual techniques;Patient/family education;Passive range of motion;Dry  needling;Taping    PT Next Visit Plan ERO v D/C. edu husband if he can make it    PT Home Exercise Plan [id number]    Consulted and Agree with Plan of Care Patient             Patient will benefit from skilled therapeutic intervention in order to improve the following deficits and impairments:  Decreased range of motion, Increased muscle spasms, Decreased activity tolerance, Pain, Impaired flexibility, Postural dysfunction  Visit Diagnosis: Cervicalgia  Abnormal posture     Problem List Patient Active Problem List   Diagnosis Date Noted   Lentigo 06/17/2020   Melanocytic nevi of trunk 06/17/2020   Other seborrheic keratosis 06/17/2020   Lumbar back pain with radiculopathy affecting right lower extremity 04/07/2015   Osteoporosis, 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   General medical examination 03/02/2011   SHOULDER IMPINGEMENT SYNDROME, LEFT 02/15/2010   NEOPLASM OF UNCERTAIN BEHAVIOR OF SKIN 12/02/2009   Hyperlipidemia 06/08/2006   Depression 06/08/2006   MIGRAINE HEADACHE 06/08/2006   INSOMNIA 06/08/2006   Maudell Stanbrough C. Cheralyn Oliver PT, DPT 10/05/20 9:36 AM   [facility] [place], [place], [address] Phone: [phone]   Fax:  [phone]  Name: Dominique Harrell MRN: [id number] Date of Birth: [birth year]

## 2020-10-07 ENCOUNTER — Encounter (HOSPITAL_BASED_OUTPATIENT_CLINIC_OR_DEPARTMENT_OTHER): Payer: Self-pay | Admitting: Physical Therapy

## 2020-10-07 ENCOUNTER — Other Ambulatory Visit: Payer: Self-pay

## 2020-10-07 ENCOUNTER — Ambulatory Visit (HOSPITAL_BASED_OUTPATIENT_CLINIC_OR_DEPARTMENT_OTHER): Payer: Medicare PPO | Admitting: Physical Therapy

## 2020-10-07 DIAGNOSIS — R293 Abnormal posture: Secondary | ICD-10-CM

## 2020-10-07 DIAGNOSIS — M542 Cervicalgia: Secondary | ICD-10-CM

## 2020-10-07 NOTE — Therapy (Signed)
[place], [place], [address] Phone: [phone]   Fax:  [phone]  Physical Therapy Treatment/Discharge  Patient Details  Name: Dominique Harrell MRN: [id number] Date of Birth: 1955/03/02 Referring Provider (PT): Maximiano Coss, NP   Encounter Date: 10/07/2020   PT End of Session - 10/07/20 1146     Visit Number 7    Number of Visits 7    Date for PT Re-Evaluation 10/08/20    Authorization Type [employer] MCR    PT Start Time 8381    PT Stop Time 1211    PT Time Calculation (min) 26 min    Activity Tolerance Patient tolerated treatment well    Behavior During Therapy [facility] for tasks assessed/performed             Past Medical History:  Diagnosis Date   Hyperlipidemia    Insomnia    Migraine    Osteopenia     Past Surgical History:  Procedure Laterality Date   COLONOSCOPY  08/14/2007   w/Dr.Stark   excision of liposarcoma thigh Left    removed at [facility]; drop in Blood pressure per pt   OOPHORECTOMY  1999   left, dermoid cyst    There were no vitals filed for this visit.   Subjective Assessment - 10/07/20 1219     Subjective Not knotted up today. feel confident in exercises.    Patient Stated Goals what exercises to do, how to sit, avoid return of pain    Currently in Pain? No/denies                [facility] PT Assessment - 10/07/20 0001       ROM / Strength   AROM / PROM / Strength Strength      AROM   Cervical - Right Side Bend 24    Cervical - Left Side Bend 24    Cervical - Right Rotation 48    Cervical - Left Rotation 46      Strength   Overall Strength Comments bil gross 5/5 without elevation of shoulders as in the start of POC                           [facility] Adult PT Treatment/Exercise - 10/07/20 0001       Neck Exercises: Prone   Other Prone Exercise moving prone exercises to standing bent over on counter      Manual Therapy   Manual  therapy comments educated & practiced with husband for upper traps, mid traps & levator scap STM bil                    PT Education - 10/07/20 1219     Education Details importance of continued exercise, tension to expect & returning PRN    Person(s) Educated Patient    Methods Explanation    Comprehension Verbalized understanding                 PT Long Term Goals - 10/07/20 1155       PT LONG TERM GOAL #1   Title Equal cervical ROM in sidebending    Status Achieved      PT LONG TERM GOAL #2   Title able to drive without incr neck pain    Status Achieved      PT LONG TERM GOAL #3   Title able to play with her grand children without  limitation    Status Achieved      PT LONG TERM GOAL #4   Title Pt will be I in long term HEP for continued care of spine    Status Achieved                   Plan - 10/07/20 1222     Clinical Impression Statement Pt has met all of her goals at this time and is prepared for d/c to an independent HEP. Educated husband on assisting in [facility] when tension arises. Encouraged her to contact me with any further questions.    PT Treatment/Interventions ADLs/Self Care Home Management;Aquatic Therapy;Cryotherapy;Electrical Stimulation;Ultrasound;Traction;Moist Heat;Iontophoresis 4mg /ml Dexamethasone;Functional mobility training;Therapeutic activities;Therapeutic exercise;Neuromuscular re-education;Manual techniques;Patient/family education;Passive range of motion;Dry needling;Taping    PT Home Exercise Plan [id number]    Consulted and Agree with Plan of Care Patient             Patient will benefit from skilled therapeutic intervention in order to improve the following deficits and impairments:  Decreased range of motion, Increased muscle spasms, Decreased activity tolerance, Pain, Impaired flexibility, Postural dysfunction  Visit Diagnosis: Cervicalgia  Abnormal posture     Problem List Patient Active Problem List    Diagnosis Date Noted   Lentigo 06/17/2020   Melanocytic nevi of trunk 06/17/2020   Other seborrheic keratosis 06/17/2020   Lumbar back pain with radiculopathy affecting right lower extremity 04/07/2015   Osteoporosis, 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   General medical examination 03/02/2011   SHOULDER IMPINGEMENT SYNDROME, LEFT 02/15/2010   NEOPLASM OF UNCERTAIN BEHAVIOR OF SKIN 12/02/2009   Hyperlipidemia 06/08/2006   Depression 06/08/2006   MIGRAINE HEADACHE 06/08/2006   INSOMNIA 06/08/2006   PHYSICAL THERAPY DISCHARGE SUMMARY  Visits from Start of Care: 7  Current functional level related to goals / functional outcomes: See above   Remaining deficits: See above   Education / Equipment: Anatomy of condition, POC, HEP,e xercise form/rationale   Patient agrees to discharge. Patient goals were met. Patient is being discharged due to meeting the stated rehab goals. Mahonri Seiden C. Ysabella Babiarz PT, DPT 10/07/20 12:24 PM   [facility] [address] [place], [place], [address] Phone: [phone]   Fax:  [phone]  Name: Dominique Harrell MRN: [id number] Date of Birth: Nov 15, 1955

## 2020-10-12 ENCOUNTER — Encounter (HOSPITAL_BASED_OUTPATIENT_CLINIC_OR_DEPARTMENT_OTHER): Payer: Medicare PPO | Admitting: Physical Therapy

## 2020-10-14 ENCOUNTER — Encounter (HOSPITAL_BASED_OUTPATIENT_CLINIC_OR_DEPARTMENT_OTHER): Payer: Medicare PPO | Admitting: Physical Therapy

## 2020-10-14 ENCOUNTER — Ambulatory Visit: Payer: Medicare PPO | Admitting: Registered Nurse

## 2020-10-14 ENCOUNTER — Other Ambulatory Visit: Payer: Self-pay

## 2020-10-14 ENCOUNTER — Encounter: Payer: Self-pay | Admitting: Registered Nurse

## 2020-10-14 VITALS — BP 115/80 | HR 91 | Temp 98.3°F | Resp 19 | Ht 63.5 in | Wt 115.6 lb

## 2020-10-14 DIAGNOSIS — M5442 Lumbago with sciatica, left side: Secondary | ICD-10-CM

## 2020-10-14 MED ORDER — METHYLPREDNISOLONE ACETATE 80 MG/ML IJ SUSP: 80.0000 mg | Freq: Once | INTRAMUSCULAR | Status: DC

## 2020-10-14 MED ORDER — MELOXICAM 7.5 MG PO TABS: ORAL_TABLET | ORAL | 0 refills | Status: DC

## 2020-10-14 MED ORDER — METHOCARBAMOL 500 MG PO TABS: 500.0000 mg | ORAL_TABLET | Freq: Three times a day (TID) | ORAL | 0 refills | Status: DC | PRN

## 2020-10-14 MED ORDER — METHYLPREDNISOLONE ACETATE 80 MG/ML IJ SUSP
80.0000 mg | Freq: Once | INTRAMUSCULAR | Status: AC
  Administered 2020-10-14: 80 mg via INTRAMUSCULAR

## 2020-10-14 NOTE — Progress Notes (Signed)
Established Patient Office Visit  Subjective:  Patient ID: Dominique Harrell, female    DOB: 10-31-1955  Age: 65 y.o. MRN: [id number]  CC:  Chief Complaint  Patient presents with   Back Pain    Lower back pain    HPI [name] presents for lower left back pain  Acute onset when bending over to pick up a light object Acute pain with sciatica on L side.  No saddle symptoms Has been doing at home exercises for c spine pain noted in July  Long history of back pain. Acute on chronic episodes generally 1-2 times each year. Xrays reviewed from last visit - some mild degenerative changes in L spine.  Past Medical History:  Diagnosis Date   Hyperlipidemia    Insomnia    Migraine    Osteopenia     Past Surgical History:  Procedure Laterality Date   COLONOSCOPY  08/14/2007   w/Dr.Stark   excision of liposarcoma thigh Left    removed at [facility]; drop in Blood pressure per pt   OOPHORECTOMY  1999   left, dermoid cyst    Family History  Problem Relation Age of Onset   Hyperlipidemia Other    Hypertension Other    Diabetes Mother    Hypertension Mother    Parkinsonism Father    Colon cancer Neg Hx    Colon polyps Neg Hx    Esophageal cancer Neg Hx    Rectal cancer Neg Hx    Stomach cancer Neg Hx     Social History   Socioeconomic History   Marital status: Married    Spouse name: Not on file   Number of children: Not on file   Years of education: Not on file   Highest education level: Not on file  Occupational History   Not on file  Tobacco Use   Smoking status: Never   Smokeless tobacco: Never  Vaping Use   Vaping Use: Never used  Substance and Sexual Activity   Alcohol use: Yes    Comment: occ.   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   Social Determinants of Health   Financial Resource Strain: Not on file  Food Insecurity: Not on  file  Transportation Needs: Not on file  Physical Activity: Not on file  Stress: Not on file  Social Connections: Not on file  Intimate Partner Violence: Not on file    Outpatient Medications Prior to Visit  Medication Sig Dispense Refill   aspirin 81 MG tablet Take 81 mg by mouth daily.     Calcium Carbonate-Vit D-Min 600-400 MG-UNIT TABS Take 1 tablet by mouth 2 (two) times daily.     fluticasone (FLONASE) 50 MCG/ACT nasal spray SHAKE LIQUID AND USE 2 SPRAYS IN EACH NOSTRIL DAILY 16 g 3   Omega-3 Fatty Acids (FISH OIL CONCENTRATE PO) Take 1 tablet by mouth daily.     simvastatin (ZOCOR) 40 MG tablet TAKE 1 TABLET(40 MG) BY MOUTH DAILY AT 6 PM 90 tablet 1   meloxicam (MOBIC) 7.5 MG tablet TAKE 1 TABLET(7.5 MG) BY MOUTH DAILY 30 tablet 0   methocarbamol (ROBAXIN) 500 MG tablet Take 1 tablet (500 mg total) by mouth every 8 (eight) hours as needed for muscle spasms. (Patient not taking: Reported on 10/14/2020) 45 tablet 0   No facility-administered medications prior to visit.  Allergies  Allergen Reactions   Penicillins Rash   Tramadol Other (See Comments)    Caused a migraine     ROS Review of Systems  Constitutional: Negative.   HENT: Negative.    Eyes: Negative.   Respiratory: Negative.    Cardiovascular: Negative.   Gastrointestinal: Negative.   Genitourinary: Negative.   Musculoskeletal:  Positive for back pain.  Skin: Negative.   Neurological: Negative.   Psychiatric/Behavioral: Negative.    All other systems reviewed and are negative.    Objective:    Physical Exam Vitals and nursing note reviewed.  Constitutional:      General: She is not in acute distress.    Appearance: Normal appearance. She is normal weight. She is not ill-appearing, toxic-appearing or diaphoretic.  Cardiovascular:     Rate and Rhythm: Normal rate and regular rhythm.     Heart sounds: Normal heart sounds. No murmur heard.   No friction rub. No gallop.  Pulmonary:     Effort:  Pulmonary effort is normal. No respiratory distress.     Breath sounds: Normal breath sounds. No stridor. No wheezing, rhonchi or rales.  Chest:     Chest wall: No tenderness.  Musculoskeletal:        General: Tenderness (lower left back) present. No swelling, deformity or signs of injury.     Right lower leg: No edema.     Left lower leg: No edema.     Comments: Positive L straight leg raise  Skin:    General: Skin is warm and dry.  Neurological:     General: No focal deficit present.     Mental Status: She is alert and oriented to person, place, and time. Mental status is at baseline.  Psychiatric:        Mood and Affect: Mood normal.        Behavior: Behavior normal.        Thought Content: Thought content normal.        Judgment: Judgment normal.    BP 115/80   Pulse 91   Temp 98.3 F (36.8 C) (Temporal)   Resp 19   Ht 5' 3.5" (1.613 m)   Wt 115 lb 9.6 oz (52.4 kg)   SpO2 98%   BMI 20.16 kg/m  Wt Readings from Last 3 Encounters:  10/14/20 115 lb 9.6 oz (52.4 kg)  09/08/20 116 lb 12.8 oz (53 kg)  06/29/20 126 lb 12.8 oz (57.5 kg)     Health Maintenance Due  Topic Date Due   INFLUENZA VACCINE  09/13/2020    There are no preventive care reminders to display for this patient.  Lab Results  Component Value Date   TSH 1.59 06/29/2020   Lab Results  Component Value Date   WBC 4.9 01/02/2020   HGB 14.2 01/02/2020   HCT 42.1 01/02/2020   MCV 94.3 01/02/2020   PLT 182.0 01/02/2020   Lab Results  Component Value Date   NA 141 06/29/2020   K 3.9 06/29/2020   CO2 31 06/29/2020   GLUCOSE 88 06/29/2020   BUN 21 06/29/2020   CREATININE 0.76 06/29/2020   BILITOT 0.5 07/16/2020   ALKPHOS 40 07/16/2020   AST 22 07/16/2020   ALT 25 07/16/2020   PROT 6.8 07/16/2020   ALBUMIN 4.7 07/16/2020   CALCIUM 9.3 06/29/2020   GFR 82.28 06/29/2020   Lab Results  Component Value Date   CHOL 204 (H) 06/29/2020   Lab Results  Component Value Date   HDL  59.50  06/29/2020   Lab Results  Component Value Date   LDLCALC 78 01/02/2020   Lab Results  Component Value Date   TRIG 243.0 (H) 06/29/2020   Lab Results  Component Value Date   CHOLHDL 3 06/29/2020   No results found for: HGBA1C    Assessment & Plan:   Problem List Items Addressed This Visit   None Visit Diagnoses     Acute left-sided low back pain with left-sided sciatica    -  Primary   Relevant Medications   methylPREDNISolone acetate (DEPO-MEDROL) injection 80 mg   methocarbamol (ROBAXIN) 500 MG tablet   meloxicam (MOBIC) 7.5 MG tablet   methylPREDNISolone acetate (DEPO-MEDROL) injection 80 mg (Completed)   Other Relevant Orders   Ambulatory referral to Physical Therapy       Meds ordered this encounter  Medications   methylPREDNISolone acetate (DEPO-MEDROL) injection 80 mg   methocarbamol (ROBAXIN) 500 MG tablet    Sig: Take 1 tablet (500 mg total) by mouth every 8 (eight) hours as needed for muscle spasms.    Dispense:  45 tablet    Refill:  0    Order Specific Question:   Supervising Provider    Answer:   Carlota Raspberry, JEFFREY R [[id number]]   meloxicam (MOBIC) 7.5 MG tablet    Sig: TAKE 1 TABLET(7.5 MG) BY MOUTH DAILY    Dispense:  30 tablet    Refill:  0    Order Specific Question:   Supervising Provider    Answer:   Carlota Raspberry, JEFFREY R [[id number]]   methylPREDNISolone acetate (DEPO-MEDROL) injection 80 mg    Follow-up: Return if symptoms worsen or fail to improve.   PLAN Reviewed treatment options. Pt has done well with depo medrol in the past. Will pursue again after discussion of risks, benefits and Aes. Will give mobic and methocarbamol as above for relief Refer to PT as she would benefit from more nonpharmacologic tools for pain relief Reviewed imaging with patient Patient encouraged to call clinic with any questions, comments, or concerns.  Maximiano Coss, NP

## 2020-10-14 NOTE — Progress Notes (Signed)
Patient presents to the office for lower back pain. Per provider, instructed to administer '80mg'$ /ml of Methylprednisolone Acetate Injection. The injection was given in her left glute and patient tolerated well. Patient will follow up if pain worsens.

## 2020-10-14 NOTE — Patient Instructions (Signed)
Ms. Huet -  Sorry to see you so soon  Steroid shot today should help soon  Have sent refills of methocarbamol and meloxicam to take as directed as needed  PT aware to help out with lower back more too  Call me if you need anything  Thank you  Denice Paradise

## 2020-11-12 ENCOUNTER — Other Ambulatory Visit: Payer: Self-pay | Admitting: Registered Nurse

## 2020-11-12 DIAGNOSIS — M5442 Lumbago with sciatica, left side: Secondary | ICD-10-CM

## 2020-11-25 ENCOUNTER — Ambulatory Visit (HOSPITAL_BASED_OUTPATIENT_CLINIC_OR_DEPARTMENT_OTHER): Payer: Medicare PPO | Attending: Registered Nurse | Admitting: Physical Therapy

## 2020-11-25 ENCOUNTER — Encounter (HOSPITAL_BASED_OUTPATIENT_CLINIC_OR_DEPARTMENT_OTHER): Payer: Self-pay | Admitting: Physical Therapy

## 2020-11-25 ENCOUNTER — Other Ambulatory Visit: Payer: Self-pay

## 2020-11-25 DIAGNOSIS — M542 Cervicalgia: Secondary | ICD-10-CM | POA: Insufficient documentation

## 2020-11-25 DIAGNOSIS — R293 Abnormal posture: Secondary | ICD-10-CM | POA: Insufficient documentation

## 2020-11-25 DIAGNOSIS — M5442 Lumbago with sciatica, left side: Secondary | ICD-10-CM | POA: Insufficient documentation

## 2020-11-25 NOTE — Therapy (Signed)
OUTPATIENT PHYSICAL THERAPY THORACOLUMBAR EVALUATION   Patient Name: Dominique Harrell MRN: [id number] DOB:[birth year], 65 y.o., female 49 Date: 11/26/2020   PT End of Session - 11/25/20 0925     Visit Number 1    Number of Visits 13    Date for PT Re-Evaluation 01/07/21    Authorization Type [employer] MCR    PT Start Time 0845    PT Stop Time 0925    PT Time Calculation (min) 40 min    Activity Tolerance Patient tolerated treatment well    Behavior During Therapy [facility] for tasks assessed/performed             Past Medical History:  Diagnosis Date   Hyperlipidemia    Insomnia    Migraine    Osteopenia    Past Surgical History:  Procedure Laterality Date   COLONOSCOPY  08/14/2007   w/Dr.Stark   excision of liposarcoma thigh Left    removed at [facility]; drop in Blood pressure per pt   OOPHORECTOMY  1999   left, dermoid cyst   Patient Active Problem List   Diagnosis Date Noted   Lentigo 06/17/2020   Melanocytic nevi of trunk 06/17/2020   Other seborrheic keratosis 06/17/2020   Lumbar back pain with radiculopathy affecting right lower extremity 04/07/2015   Osteoporosis, 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   General medical examination 03/02/2011   SHOULDER IMPINGEMENT SYNDROME, LEFT 02/15/2010   NEOPLASM OF UNCERTAIN BEHAVIOR OF SKIN 12/02/2009   Hyperlipidemia 06/08/2006   Depression 06/08/2006   MIGRAINE HEADACHE 06/08/2006   INSOMNIA 06/08/2006    PCP: Midge Minium, MD  REFERRING PROVIDER: Maximiano Coss, NP  REFERRING DIAG: [phone] (ICD-10-CM) - Acute left-sided low back pain with left-sided sciatica   THERAPY DIAG:  Acute left-sided low back pain with left-sided sciatica  Abnormal posture  ONSET DATE: first week of Sept  SUBJECTIVE:                                                                                                                                                                                           SUBJECTIVE STATEMENT: Back went  out as it does occasionally but I could not even get here for an appointment. Pain in legs has subsided. It happened as I was standing from a squat position- picking up a purifier and had to crawl to call my husband. Now my neck is tightened back up.  PERTINENT HISTORY:  Chronic back pain  PAIN:  Are you having pain? Yes VAS scale: moderate/10 Pain location: low back, Lt Pain orientation: Left  PAIN TYPE: tight and spasm Pain description: constant  Aggravating factors: bending Relieving factors: modalities  PRECAUTIONS: None  WEIGHT BEARING RESTRICTIONS No  FALLS:  Has patient fallen in last 6 months? No,   LIVING ENVIRONMENT: Lives with: lives with their family and lives with their spouse Lives in: House/apartment Stairs: Yes;    OCCUPATION: retired  PLOF: Independent  PATIENT GOALS go to [employer] for Thanksgiving   OBJECTIVE:   DIAGNOSTIC FINDINGS:  Lumbar: IMPRESSION: 1. Mild for age spondylosis with endplate spurring. Mild disc space narrowing at L5-S1. 2. Mild lower lumbar facet hypertrophy.    SCREENING FOR RED FLAGS: Bowel or bladder incontinence: No  COGNITION:  Overall cognitive status: Within functional limits for tasks assessed     SENSATION:  WFL   MUSCLE LENGTH: General tightness and spasm into hamstrings due to back pain  POSTURE:  Guarded with mild incr in kyphosis and forward head   LUMBARAROM/PROM- all painful  A/PROM A/PROM  11/26/2020  Flexion <50%  Extension <50%  Right lateral flexion <50%  Left lateral flexion <50%  Right rotation <50%  Left rotation <50%   (Blank rows = not tested)  LE AROM/PROM:  Hip flexion limited due to spasm  LE MMT: not appropriate to test at eval due to lumbar spasm  MMT Right 11/26/2020 Left 11/26/2020  Hip flexion    Hip extension    Hip abduction    Hip adduction    Hip internal rotation    Hip external rotation    Knee flexion    Knee extension    Ankle dorsiflexion    Ankle  plantarflexion    Ankle inversion    Ankle eversion     (Blank rows = not tested)    SPINAL SEGMENTAL MOBILITY ASSESSMENT:  Limited mobility grossly   GAIT:  Comments: mild trendelenburg, guarded trunk rotation and lacking arm swing    TODAY'S TREATMENT  Hesch self correction for Lt post innom.  Post sacral tilt on towel roll with legs elevated Manual: STM to suboccipitals, Rt to Lt cerevical sideglides, cervical traction   PATIENT EDUCATION:  Education details: Anatomy of condition, POC, HEP, exercise form/rationale  Person educated: Patient Education method: Explanation, Demonstration, Tactile cues, Verbal cues, and Handouts Education comprehension: verbalized understanding, returned demonstration, verbal cues required, tactile cues required, and needs further education   HOME EXERCISE PROGRAM: Hesch self correction for Lt post innom [id number]  ASSESSMENT:  CLINICAL IMPRESSION: Patient is a 65 y.o. F who was seen today for physical therapy evaluation and treatment for acute on chronic onset of LBP after low back "went out". Objective impairments include Abnormal gait, decreased activity tolerance, difficulty walking, decreased ROM, hypomobility, increased muscle spasms, impaired flexibility, postural dysfunction, and pain. These impairments are limiting patient from cleaning, community activity, driving, meal prep, laundry, and yard work. Personal factors including 1-2 comorbidities: h/o back "giving out", involvement of thoracic and cervical regions, OP  are also affecting patient's functional outcome. Patient will benefit from skilled PT to address above impairments and improve overall function.  REHAB POTENTIAL: Good  CLINICAL DECISION MAKING: Evolving/moderate complexity  EVALUATION COMPLEXITY: Moderate   GOALS: Goals reviewed with patient? Yes  SHORT TERM GOALS:  STG Name Target Date Goal status  1 Able to demo 50% of lumbar motion Baseline: less due to  pain at eval 12/17/2020 INITIAL    LTG Name Target Date Goal status  1 Able to demo trunk flexion to mid-shin for proper bending Baseline: unable at eval 01/07/2021 INITIAL  2 Pt will tolerate walking for ADLs without limitaiton by back pain Baseline: limited at  eval 01/07/2021 INITIAL  3 Independent with stretches for lumbar spine and lower body Baseline: will progress as appropriate 01/07/2021 INITIAL  4 Independent with long term core strength/stability program to reduce future risk of back "going out" Baseline: will progress as appropriate 01/07/2021 INITIAL                  PLAN: PT FREQUENCY: 1-2x/week  PT DURATION: 6 weeks  PLANNED INTERVENTIONS: Therapeutic exercises, Therapeutic activity, Neuro Muscular re-education, Balance training, Gait training, Patient/Family education, Joint mobilization, Dry Needling, Electrical stimulation, Spinal mobilization, Cryotherapy, Moist heat, Traction, and Manual therapy  PLAN FOR NEXT SESSION: recheck pelvic alignment. Begin core activation, manual PRN Sadeel Fiddler C. Sahir Tolson PT, DPT 11/26/20 10:00 AM

## 2020-11-29 ENCOUNTER — Encounter (HOSPITAL_BASED_OUTPATIENT_CLINIC_OR_DEPARTMENT_OTHER): Payer: Self-pay | Admitting: Physical Therapy

## 2020-11-29 ENCOUNTER — Ambulatory Visit (HOSPITAL_BASED_OUTPATIENT_CLINIC_OR_DEPARTMENT_OTHER): Payer: Medicare PPO | Admitting: Physical Therapy

## 2020-11-29 ENCOUNTER — Other Ambulatory Visit: Payer: Self-pay

## 2020-11-29 DIAGNOSIS — R293 Abnormal posture: Secondary | ICD-10-CM

## 2020-11-29 DIAGNOSIS — M5442 Lumbago with sciatica, left side: Secondary | ICD-10-CM | POA: Diagnosis not present

## 2020-11-29 DIAGNOSIS — M542 Cervicalgia: Secondary | ICD-10-CM

## 2020-11-29 NOTE — Therapy (Signed)
OUTPATIENT PHYSICAL THERAPY TREATMENT NOTE   Patient Name: Dominique Harrell MRN: [id number] DOB:1955-07-27, 65 y.o., female 40 Date: 11/29/2020  PCP: Midge Minium, MD REFERRING PROVIDER: Maximiano Coss, NP   PT End of Session - 11/29/20 1345     Visit Number 2    Number of Visits 13    Date for PT Re-Evaluation 01/07/21    Authorization Type [employer] MCR    PT Start Time 7017    PT Stop Time 1423    PT Time Calculation (min) 38 min    Activity Tolerance Patient tolerated treatment well    Behavior During Therapy WFL for tasks assessed/performed             Past Medical History:  Diagnosis Date   Hyperlipidemia    Insomnia    Migraine    Osteopenia    Past Surgical History:  Procedure Laterality Date   COLONOSCOPY  08/14/2007   w/Dr.Stark   excision of liposarcoma thigh Left    removed at [facility]; drop in Blood pressure per pt   OOPHORECTOMY  1999   left, dermoid cyst   Patient Active Problem List   Diagnosis Date Noted   Lentigo 06/17/2020   Melanocytic nevi of trunk 06/17/2020   Other seborrheic keratosis 06/17/2020   Lumbar back pain with radiculopathy affecting right lower extremity 04/07/2015   Osteoporosis, 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   General medical examination 03/02/2011   SHOULDER IMPINGEMENT SYNDROME, LEFT 02/15/2010   NEOPLASM OF UNCERTAIN BEHAVIOR OF SKIN 12/02/2009   Hyperlipidemia 06/08/2006   Depression 06/08/2006   MIGRAINE HEADACHE 06/08/2006   INSOMNIA 06/08/2006    REFERRING DIAG: M54.42 (ICD-10-CM) - Acute left-sided low back pain with left-sided sciatica   THERAPY DIAG:  Acute left-sided low back pain with left-sided sciatica  Abnormal posture  Cervicalgia  PERTINENT HISTORY: chronic back pain  PRECAUTIONS: none  SUBJECTIVE: I did the exerises but sciatica came back on Sunday. Back feels okay right now. Neck is still a mess. No distal symptoms right now.   PAIN:  Are you having pain? Yes Pain description:  aching      OBJECTIVE:  DIAGNOSTIC FINDINGS:  Lumbar: IMPRESSION: 1. Mild for age spondylosis with endplate spurring. Mild disc space narrowing at L5-S1. 2. Mild lower lumbar facet hypertrophy.       SCREENING FOR RED FLAGS: Bowel or bladder incontinence: No   COGNITION:          Overall cognitive status: Within functional limits for tasks assessed                        SENSATION:          WFL     MUSCLE LENGTH: General tightness and spasm into hamstrings due to back pain   POSTURE:  Guarded with mild incr in kyphosis and forward head    LUMBARAROM/PROM- all painful   A/PROM A/PROM  11/26/2020  Flexion <50%  Extension <50%  Right lateral flexion <50%  Left lateral flexion <50%  Right rotation <50%  Left rotation <50%   (Blank rows = not tested)   LE AROM/PROM:   Hip flexion limited due to spasm   LE MMT: not appropriate to test at eval due to lumbar spasm   MMT Right 11/26/2020 Left 11/26/2020  Hip flexion      Hip extension      Hip abduction      Hip adduction      Hip internal rotation  Hip external rotation      Knee flexion      Knee extension      Ankle dorsiflexion      Ankle plantarflexion      Ankle inversion      Ankle eversion       (Blank rows = not tested)       SPINAL SEGMENTAL MOBILITY ASSESSMENT:  Limited mobility grossly     GAIT:   Comments: mild trendelenburg, guarded trunk rotation and lacking arm swing     TODAY'S TREATMENT: 10/17:  Manual- post rot of Rt innom 2 min- supine & prone each; STM thoracic/periscap region bilat  Rt knee to Rt shoulder 2 min hold  Seated cervical stretches  Scap retraction  Eval: Hesch self correction for Lt post innom.  Post sacral tilt on towel roll with legs elevated Manual: STM to suboccipitals, Rt to Lt cerevical sideglides, cervical traction  PATIENT EDUCATION:  Education details: Anatomy of condition, POC, HEP, exercise form/rationale   Person educated:  Patient Education method: Explanation, Demonstration, Tactile cues, Verbal cues, and Handouts Education comprehension: verbalized understanding, returned demonstration, verbal cues required, tactile cues required, and needs further education     HOME EXERCISE PROGRAM: Hesch self correction for Rt ant innom [id number]   ASSESSMENT:   CLINICAL IMPRESSION: Altered plan to address Rt anterior innominate- equalized leg length noted following manual correction. Pt will use self treatment until next appointment. Addressed spasm in periscap region and reviewed seated postural alignment.  REHAB POTENTIAL: Good   CLINICAL DECISION MAKING: Evolving/moderate complexity   EVALUATION COMPLEXITY: Moderate     GOALS: Goals reviewed with patient? Yes   SHORT TERM GOALS:   STG Name Target Date Goal status  1 Able to demo 50% of lumbar motion Baseline: less due to pain at eval 12/17/2020 INITIAL      LTG Name Target Date Goal status  1 Able to demo trunk flexion to mid-shin for proper bending Baseline: unable at eval 01/07/2021 INITIAL  2 Pt will tolerate walking for ADLs without limitaiton by back pain Baseline: limited at eval 01/07/2021 INITIAL  3 Independent with stretches for lumbar spine and lower body Baseline: will progress as appropriate 01/07/2021 INITIAL  4 Independent with long term core strength/stability program to reduce future risk of back "going out" Baseline: will progress as appropriate 01/07/2021 INITIAL                               PLAN: PT FREQUENCY: 1-2x/week   PT DURATION: 6 weeks   PLANNED INTERVENTIONS: Therapeutic exercises, Therapeutic activity, Neuro Muscular re-education, Balance training, Gait training, Patient/Family education, Joint mobilization, Dry Needling, Electrical stimulation, Spinal mobilization, Cryotherapy, Moist heat, Traction, and Manual therapy   PLAN FOR NEXT SESSION: recheck pelvic alignment Keiva Dina C. Previn Jian PT, DPT 11/29/20 2:25  PM

## 2020-12-02 ENCOUNTER — Other Ambulatory Visit: Payer: Self-pay

## 2020-12-02 ENCOUNTER — Encounter (HOSPITAL_BASED_OUTPATIENT_CLINIC_OR_DEPARTMENT_OTHER): Payer: Self-pay | Admitting: Physical Therapy

## 2020-12-02 ENCOUNTER — Ambulatory Visit (HOSPITAL_BASED_OUTPATIENT_CLINIC_OR_DEPARTMENT_OTHER): Payer: Medicare PPO | Admitting: Physical Therapy

## 2020-12-02 DIAGNOSIS — M5442 Lumbago with sciatica, left side: Secondary | ICD-10-CM

## 2020-12-02 DIAGNOSIS — R293 Abnormal posture: Secondary | ICD-10-CM

## 2020-12-02 DIAGNOSIS — M542 Cervicalgia: Secondary | ICD-10-CM

## 2020-12-02 NOTE — Therapy (Signed)
OUTPATIENT PHYSICAL THERAPY TREATMENT NOTE   Patient Name: Dominique Harrell MRN: [id number] DOB:11-Sep-1955, 65 y.o., female 72 Date: 12/02/2020  PCP: Midge Minium, MD REFERRING PROVIDER: Maximiano Coss, NP   PT End of Session - 12/02/20 1143     Visit Number 3    Number of Visits 13    Date for PT Re-Evaluation 01/07/21    Authorization Type [employer] MCR    PT Start Time 1143             Past Medical History:  Diagnosis Date   Hyperlipidemia    Insomnia    Migraine    Osteopenia    Past Surgical History:  Procedure Laterality Date   COLONOSCOPY  08/14/2007   w/Dr.Stark   excision of liposarcoma thigh Left    removed at [facility]; drop in Blood pressure per pt   OOPHORECTOMY  1999   left, dermoid cyst   Patient Active Problem List   Diagnosis Date Noted   Lentigo 06/17/2020   Melanocytic nevi of trunk 06/17/2020   Other seborrheic keratosis 06/17/2020   Lumbar back pain with radiculopathy affecting right lower extremity 04/07/2015   Osteoporosis, 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   General medical examination 03/02/2011   SHOULDER IMPINGEMENT SYNDROME, LEFT 02/15/2010   NEOPLASM OF UNCERTAIN BEHAVIOR OF SKIN 12/02/2009   Hyperlipidemia 06/08/2006   Depression 06/08/2006   MIGRAINE HEADACHE 06/08/2006   INSOMNIA 06/08/2006    REFERRING DIAG: M54.42 (ICD-10-CM) - Acute left-sided low back pain with left-sided sciatica   THERAPY DIAG:  Acute left-sided low back pain with left-sided sciatica  Abnormal posture  Cervicalgia  PERTINENT HISTORY: chronic back pain  PRECAUTIONS: none  SUBJECTIVE: Back is feeling okay. Neck is just super spasmed.  PAIN:  Are you having pain? Yes Pain description: aching      OBJECTIVE:  DIAGNOSTIC FINDINGS:  Lumbar: IMPRESSION: 1. Mild for age spondylosis with endplate spurring. Mild disc space narrowing at L5-S1. 2. Mild lower lumbar facet hypertrophy.       SCREENING FOR RED FLAGS: Bowel or bladder  incontinence: No   COGNITION:          Overall cognitive status: Within functional limits for tasks assessed                        SENSATION:          WFL     MUSCLE LENGTH: General tightness and spasm into hamstrings due to back pain   POSTURE:  Guarded with mild incr in kyphosis and forward head    LUMBARAROM/PROM- all painful   A/PROM A/PROM  11/26/2020  Flexion <50%  Extension <50%  Right lateral flexion <50%  Left lateral flexion <50%  Right rotation <50%  Left rotation <50%   (Blank rows = not tested)   LE AROM/PROM:   Hip flexion limited due to spasm   LE MMT: not appropriate to test at eval due to lumbar spasm   MMT Right 11/26/2020 Left 11/26/2020  Hip flexion      Hip extension      Hip abduction      Hip adduction      Hip internal rotation      Hip external rotation      Knee flexion      Knee extension      Ankle dorsiflexion      Ankle plantarflexion      Ankle inversion      Ankle eversion       (  Blank rows = not tested)       SPINAL SEGMENTAL MOBILITY ASSESSMENT:  Limited mobility grossly     GAIT:   Comments: mild trendelenburg, guarded trunk rotation and lacking arm swing     TODAY'S TREATMENT: 10/19  Manual with dry needling: skilled palpation and monitoring (bil upper traps); STM to bil upper traps & levator scap; prone thoracic PA; supine cervical rotation with mob to lower cervical spine; manual cervical traction  Supine protraction reach  Seated upper trap & levator stretch holding 3 breaths  Child pose  10/17:  Manual- post rot of Rt innom 2 min- supine & prone each; STM thoracic/periscap region bilat  Rt knee to Rt shoulder 2 min hold  Seated cervical stretches  Scap retraction  Eval: Hesch self correction for Lt post innom.  Post sacral tilt on towel roll with legs elevated Manual: STM to suboccipitals, Rt to Lt cerevical sideglides, cervical traction  PATIENT EDUCATION:  Education details: anatomy of  condition- cervical-lumbar effects along chain   Person educated: Patient Education method: Explanation, Demonstration, Tactile cues, Verbal cues, and Handouts Education comprehension: verbalized understanding, returned demonstration, verbal cues required, tactile cues required, and needs further education     HOME EXERCISE PROGRAM: Hesch self correction for Rt ant innom [id number]   ASSESSMENT:   CLINICAL IMPRESSION: Focused on reducing spasm and improved cervical mobility today since lower back spasm is not bad. Shoulder pain flared back up after doing child pose indicating impingement in Lt shoulder- reduced with STM but would benefit from evaluation of shoulder motion to decrease return of spasm.  REHAB POTENTIAL: Good   CLINICAL DECISION MAKING: Evolving/moderate complexity   EVALUATION COMPLEXITY: Moderate     GOALS: Goals reviewed with patient? Yes   SHORT TERM GOALS:   STG Name Target Date Goal status  1 Able to demo 50% of lumbar motion Baseline: less due to pain at eval 12/17/2020 INITIAL      LTG Name Target Date Goal status  1 Able to demo trunk flexion to mid-shin for proper bending Baseline: unable at eval 01/07/2021 INITIAL  2 Pt will tolerate walking for ADLs without limitaiton by back pain Baseline: limited at eval 01/07/2021 INITIAL  3 Independent with stretches for lumbar spine and lower body Baseline: will progress as appropriate 01/07/2021 INITIAL  4 Independent with long term core strength/stability program to reduce future risk of back "going out" Baseline: will progress as appropriate 01/07/2021 INITIAL                               PLAN: PT FREQUENCY: 1-2x/week   PT DURATION: 6 weeks   PLANNED INTERVENTIONS: Therapeutic exercises, Therapeutic activity, Neuro Muscular re-education, Balance training, Gait training, Patient/Family education, Joint mobilization, Dry Needling, Electrical stimulation, Spinal mobilization, Cryotherapy, Moist heat,  Traction, and Manual therapy   PLAN FOR NEXT SESSION: recheck pelvic alignment, outcome of DN?  Elaysha Bevard C. Antario Yasuda PT, DPT 12/02/20 11:44 AM

## 2020-12-07 ENCOUNTER — Encounter (HOSPITAL_BASED_OUTPATIENT_CLINIC_OR_DEPARTMENT_OTHER): Payer: Medicare PPO | Attending: Physical Therapy | Admitting: Physical Therapy

## 2020-12-07 ENCOUNTER — Other Ambulatory Visit: Payer: Self-pay

## 2020-12-07 ENCOUNTER — Encounter (HOSPITAL_BASED_OUTPATIENT_CLINIC_OR_DEPARTMENT_OTHER): Payer: Self-pay | Admitting: Physical Therapy

## 2020-12-07 DIAGNOSIS — M5442 Lumbago with sciatica, left side: Secondary | ICD-10-CM | POA: Diagnosis not present

## 2020-12-07 DIAGNOSIS — M542 Cervicalgia: Secondary | ICD-10-CM | POA: Diagnosis not present

## 2020-12-07 DIAGNOSIS — R293 Abnormal posture: Secondary | ICD-10-CM | POA: Diagnosis not present

## 2020-12-07 NOTE — Therapy (Signed)
OUTPATIENT PHYSICAL THERAPY TREATMENT NOTE   Patient Name: Dominique Harrell MRN: [id number] DOB:November 21, 1955, 65 y.o., female 33 Date: 12/07/2020  PCP: Midge Minium, MD REFERRING PROVIDER: Maximiano Coss, NP   PT End of Session - 12/07/20 1455     Visit Number 4    Number of Visits 13    Date for PT Re-Evaluation 01/07/21    Authorization Type [employer] MCR    PT Start Time 1230    PT Stop Time 1310    PT Time Calculation (min) 40 min    Activity Tolerance Patient tolerated treatment well    Behavior During Therapy WFL for tasks assessed/performed              Past Medical History:  Diagnosis Date   Hyperlipidemia    Insomnia    Migraine    Osteopenia    Past Surgical History:  Procedure Laterality Date   COLONOSCOPY  08/14/2007   w/Dr.Stark   excision of liposarcoma thigh Left    removed at [facility]; drop in Blood pressure per pt   OOPHORECTOMY  1999   left, dermoid cyst   Patient Active Problem List   Diagnosis Date Noted   Lentigo 06/17/2020   Melanocytic nevi of trunk 06/17/2020   Other seborrheic keratosis 06/17/2020   Lumbar back pain with radiculopathy affecting right lower extremity 04/07/2015   Osteoporosis, 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   General medical examination 03/02/2011   SHOULDER IMPINGEMENT SYNDROME, LEFT 02/15/2010   NEOPLASM OF UNCERTAIN BEHAVIOR OF SKIN 12/02/2009   Hyperlipidemia 06/08/2006   Depression 06/08/2006   MIGRAINE HEADACHE 06/08/2006   INSOMNIA 06/08/2006    REFERRING DIAG: M54.42 (ICD-10-CM) - Acute left-sided low back pain with left-sided sciatica   THERAPY DIAG:  Acute left-sided low back pain with left-sided sciatica  Abnormal posture  Cervicalgia  PERTINENT HISTORY: chronic back pain  PRECAUTIONS: none  SUBJECTIVE: Back is feeling pretty good. Neck is still tight, doing my exercises  PAIN:  Are you having pain? Yes Pain description: aching      OBJECTIVE:  DIAGNOSTIC FINDINGS:   Lumbar: IMPRESSION: 1. Mild for age spondylosis with endplate spurring. Mild disc space narrowing at L5-S1. 2. Mild lower lumbar facet hypertrophy.       SCREENING FOR RED FLAGS: Bowel or bladder incontinence: No   COGNITION:          Overall cognitive status: Within functional limits for tasks assessed                        SENSATION:          WFL     MUSCLE LENGTH: General tightness and spasm into hamstrings due to back pain   POSTURE:  Guarded with mild incr in kyphosis and forward head    LUMBARAROM/PROM- all painful     Cervical ROM WFL with some tightness at end range   A/PROM A/PROM  11/26/2020  Flexion <50%  Extension <50%  Right lateral flexion <50%  Left lateral flexion <50%  Right rotation <50%  Left rotation <50%   (Blank rows = not tested)   LE AROM/PROM:   Hip flexion limited due to spasm   LE MMT: not appropriate to test at eval due to lumbar spasm   MMT Right 11/26/2020 Left 11/26/2020  Hip flexion      Hip extension      Hip abduction      Hip adduction      Hip internal rotation  Hip external rotation      Knee flexion      Knee extension      Ankle dorsiflexion      Ankle plantarflexion      Ankle inversion      Ankle eversion       (Blank rows = not tested)       SPINAL SEGMENTAL MOBILITY ASSESSMENT:  Limited mobility grossly     GAIT:   Comments: mild trendelenburg, guarded trunk rotation and lacking arm swing     TODAY'S TREATMENT: 10/25  Manual: traction, lateral cervical mobs, STM: suboccipitals, bil upper trap  Chin tuck  Pec stretch in suipine  Sidelying external rotation  Supine shoulder flexion 2lb with ab set in hooklying; iso flexion 90 deg with marching  Alt march to 90/90, hold and both legs lower  10/19  Manual with dry needling: skilled palpation and monitoring (bil upper traps); STM to bil upper traps & levator scap; prone thoracic PA; supine cervical rotation with mob to lower cervical spine;  manual cervical traction  Supine protraction reach  Seated upper trap & levator stretch holding 3 breaths  Child pose  10/17:  Manual- post rot of Rt innom 2 min- supine & prone each; STM thoracic/periscap region bilat  Rt knee to Rt shoulder 2 min hold  Seated cervical stretches  Scap retraction  Eval: Hesch self correction for Lt post innom.  Post sacral tilt on towel roll with legs elevated Manual: STM to suboccipitals, Rt to Lt cerevical sideglides, cervical traction  PATIENT EDUCATION:  Education details: anatomy of condition- cervical-lumbar effects along chain   Person educated: Patient Education method: Explanation, Demonstration, Tactile cues, Verbal cues, and Handouts Education comprehension: verbalized understanding, returned demonstration, verbal cues required, tactile cues required, and needs further education     HOME EXERCISE PROGRAM: Hesch self correction for Rt ant innom [id number]   ASSESSMENT:   CLINICAL IMPRESSION: Continued focus on cervical region with progression of core stability exercises. Good tolerance without increased pain. Will continue with progressions and postural training to decr biomechanical chain contributions.   REHAB POTENTIAL: Good   CLINICAL DECISION MAKING: Evolving/moderate complexity   EVALUATION COMPLEXITY: Moderate     GOALS: Goals reviewed with patient? Yes   SHORT TERM GOALS:   STG Name Target Date Goal status  1 Able to demo 50% of lumbar motion Baseline: less due to pain at eval 12/17/2020 INITIAL      LTG Name Target Date Goal status  1 Able to demo trunk flexion to mid-shin for proper bending Baseline: unable at eval 01/07/2021 INITIAL  2 Pt will tolerate walking for ADLs without limitaiton by back pain Baseline: limited at eval 01/07/2021 INITIAL  3 Independent with stretches for lumbar spine and lower body Baseline: will progress as appropriate 01/07/2021 INITIAL  4 Independent with long term core  strength/stability program to reduce future risk of back "going out" Baseline: will progress as appropriate 01/07/2021 INITIAL                               PLAN: PT FREQUENCY: 1-2x/week   PT DURATION: 6 weeks   PLANNED INTERVENTIONS: Therapeutic exercises, Therapeutic activity, Neuro Muscular re-education, Balance training, Gait training, Patient/Family education, Joint mobilization, Dry Needling, Electrical stimulation, Spinal mobilization, Cryotherapy, Moist heat, Traction, and Manual therapy   PLAN FOR NEXT SESSION: consider use of further DN Zoi Devine C. Endi Lagman PT, DPT 12/07/20 2:58 PM

## 2020-12-10 ENCOUNTER — Encounter: Payer: Self-pay | Admitting: Family Medicine

## 2020-12-12 NOTE — Therapy (Signed)
OUTPATIENT PHYSICAL THERAPY TREATMENT NOTE   Patient Name: LYSETTE LINDENBAUM MRN: [id number] DOB:1955-06-10, 65 y.o., female 23 Date: 12/13/2020  PCP: Midge Minium, MD REFERRING PROVIDER: Maximiano Coss, NP   PT End of Session - 12/13/20 1102     Visit Number 5    Number of Visits 13    Date for PT Re-Evaluation 01/07/21    Authorization Type [employer] MCR    PT Start Time 1013    PT Stop Time 1055    PT Time Calculation (min) 42 min    Activity Tolerance Patient tolerated treatment well    Behavior During Therapy WFL for tasks assessed/performed               Past Medical History:  Diagnosis Date   Hyperlipidemia    Insomnia    Migraine    Osteopenia    Past Surgical History:  Procedure Laterality Date   COLONOSCOPY  08/14/2007   w/Dr.Stark   excision of liposarcoma thigh Left    removed at [facility]; drop in Blood pressure per pt   OOPHORECTOMY  1999   left, dermoid cyst   Patient Active Problem List   Diagnosis Date Noted   Lentigo 06/17/2020   Melanocytic nevi of trunk 06/17/2020   Other seborrheic keratosis 06/17/2020   Lumbar back pain with radiculopathy affecting right lower extremity 04/07/2015   Osteoporosis, 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   General medical examination 03/02/2011   SHOULDER IMPINGEMENT SYNDROME, LEFT 02/15/2010   NEOPLASM OF UNCERTAIN BEHAVIOR OF SKIN 12/02/2009   Hyperlipidemia 06/08/2006   Depression 06/08/2006   MIGRAINE HEADACHE 06/08/2006   INSOMNIA 06/08/2006    REFERRING DIAG: M54.42 (ICD-10-CM) - Acute left-sided low back pain with left-sided sciatica   THERAPY DIAG:  Acute left-sided low back pain with left-sided sciatica  Abnormal posture  Cervicalgia  PERTINENT HISTORY: chronic back pain  PRECAUTIONS: none  SUBJECTIVE: thoracic is achey and neck is still stiff but I think it is better. Shoulder unable to tolerate sidelying ER. Denies HA.   PAIN:  Are you having pain? Yes  4/10 Pain description:  aching      OBJECTIVE:  DIAGNOSTIC FINDINGS:  Lumbar: IMPRESSION: 1. Mild for age spondylosis with endplate spurring. Mild disc space narrowing at L5-S1. 2. Mild lower lumbar facet hypertrophy.       SCREENING FOR RED FLAGS: Bowel or bladder incontinence: No   COGNITION:          Overall cognitive status: Within functional limits for tasks assessed                        SENSATION:          WFL     MUSCLE LENGTH: General tightness and spasm into hamstrings due to back pain   POSTURE:  Guarded with mild incr in kyphosis and forward head    LUMBARAROM/PROM- all painful     Cervical ROM WFL with some tightness at end range    A/PROM A/PROM  11/26/2020 10/31  Flexion <50%   Extension <50%   Right lateral flexion <50% WFL  Left lateral flexion <50% WFL  Right rotation <50%   Left rotation <50%    (Blank rows = not tested)   LE AROM/PROM:   Hip flexion limited due to spasm   LE MMT: not appropriate to test at eval due to lumbar spasm     MMT Right 12/13/2020 Left 12/13/2020  Hip flexion 4   5  Hip extension  Hip abduction  5 5   Hip adduction      Hip internal rotation      Hip external rotation      Knee flexion      Knee extension      Ankle dorsiflexion      Ankle plantarflexion      Ankle inversion      Ankle eversion       (Blank rows = not tested)       SPINAL SEGMENTAL MOBILITY ASSESSMENT:  Limited mobility grossly     GAIT:   Comments: mild trendelenburg, guarded trunk rotation and lacking arm swing     TODAY'S TREATMENT: 10/31  Standing hip hinge with hands on back of chair  Manua: STM to Rt thoracic paraspinals' bil upper traps (more focus on Rt), suboccipitals, manual cervical traction  Prone: scap retraction, retraction + extension  Hooklying- scap retraction+ab set without tensing through cervical region  90/90 holds with lowering  Deep breathing in supine W  Sidelying mini open books with hand to  sternum  10/25  Manual: traction, lateral cervical mobs, STM: suboccipitals, bil upper trap  Chin tuck  Pec stretch in suipine  Sidelying external rotation  Supine shoulder flexion 2lb with ab set in hooklying; iso flexion 90 deg with marching  Alt march to 90/90, hold and both legs lower  10/19  Manual with dry needling: skilled palpation and monitoring (bil upper traps); STM to bil upper traps & levator scap; prone thoracic PA; supine cervical rotation with mob to lower cervical spine; manual cervical traction  Supine protraction reach  Seated upper trap & levator stretch holding 3 breaths  Child pose  10/17:  Manual- post rot of Rt innom 2 min- supine & prone each; STM thoracic/periscap region bilat  Rt knee to Rt shoulder 2 min hold  Seated cervical stretches  Scap retraction  Eval: Hesch self correction for Lt post innom.  Post sacral tilt on towel roll with legs elevated Manual: STM to suboccipitals, Rt to Lt cerevical sideglides, cervical traction  PATIENT EDUCATION:  Education details: anatomy of condition- cervical-lumbar effects along chain   Person educated: Patient Education method: Explanation, Demonstration, Tactile cues, Verbal cues, and Handouts Education comprehension: verbalized understanding, returned demonstration, verbal cues required, tactile cues required, and needs further education     HOME EXERCISE PROGRAM: Hesch self correction for Rt ant innom [id number]   ASSESSMENT:   CLINICAL IMPRESSION: Tendency to flex vial motion through thoracic spine due to fear of lumbar pain. Will continue to work on this to decrease abnormal use of throacic/cervical region.     REHAB POTENTIAL: Good   CLINICAL DECISION MAKING: Evolving/moderate complexity   EVALUATION COMPLEXITY: Moderate     GOALS: Goals reviewed with patient? Yes   SHORT TERM GOALS:    STG Name Target Date Goal status  1 Able to demo 50% of lumbar motion Baseline: less due to pain at  eval 12/17/2020 achieved      LTG Name Target Date Goal status  1 Able to demo trunk flexion to mid-shin for proper bending Baseline: unable at eval 01/07/2021 INITIAL  2 Pt will tolerate walking for ADLs without limitaiton by back pain Baseline: limited at eval 01/07/2021 INITIAL  3 Independent with stretches for lumbar spine and lower body Baseline: will progress as appropriate 01/07/2021 INITIAL  4 Independent with long term core strength/stability program to reduce future risk of back "going out" Baseline: will progress as appropriate 01/07/2021 INITIAL  PLAN: PT FREQUENCY: 1-2x/week   PT DURATION: 6 weeks   PLANNED INTERVENTIONS: Therapeutic exercises, Therapeutic activity, Neuro Muscular re-education, Balance training, Gait training, Patient/Family education, Joint mobilization, Dry Needling, Electrical stimulation, Spinal mobilization, Cryotherapy, Moist heat, Traction, and Manual therapy   PLAN FOR NEXT SESSION: cont hip hinge training, review use of home TENS Tyrone Pautsch C. Grant Swager PT, DPT 12/13/20 11:03 AM

## 2020-12-13 ENCOUNTER — Ambulatory Visit (HOSPITAL_BASED_OUTPATIENT_CLINIC_OR_DEPARTMENT_OTHER): Payer: Medicare PPO | Admitting: Physical Therapy

## 2020-12-13 ENCOUNTER — Other Ambulatory Visit: Payer: Self-pay

## 2020-12-13 ENCOUNTER — Encounter (HOSPITAL_BASED_OUTPATIENT_CLINIC_OR_DEPARTMENT_OTHER): Payer: Self-pay | Admitting: Physical Therapy

## 2020-12-13 DIAGNOSIS — M5442 Lumbago with sciatica, left side: Secondary | ICD-10-CM | POA: Diagnosis not present

## 2020-12-13 DIAGNOSIS — M542 Cervicalgia: Secondary | ICD-10-CM

## 2020-12-13 DIAGNOSIS — R293 Abnormal posture: Secondary | ICD-10-CM

## 2020-12-14 ENCOUNTER — Other Ambulatory Visit: Payer: Self-pay

## 2020-12-14 ENCOUNTER — Telehealth: Payer: Self-pay | Admitting: Family Medicine

## 2020-12-14 DIAGNOSIS — E785 Hyperlipidemia, unspecified: Secondary | ICD-10-CM

## 2020-12-14 MED ORDER — SIMVASTATIN 40 MG PO TABS: ORAL_TABLET | ORAL | 1 refills | Status: DC

## 2020-12-14 NOTE — Telephone Encounter (Signed)
..  Caller name:  Ayaana Biondo  Caller callback [phone]  Encourage patient to contact the pharmacy for refills or they can request refills through [facility]  (Please schedule appointment if patient has not been seen in over a year)  MEDICATION NAME & DOSE:  Simbastatin 40 mg  Notes/Comments from patient:  [place]:   [facility] on [address] in [place]  Please notify patient: It takes 48-72 hours to process rx refill requests Ask patient to call pharmacy to ensure rx is ready before heading there.   (CLINICAL TO FILL OR ROUTE PER PROTOCOLS)

## 2020-12-14 NOTE — Telephone Encounter (Signed)
Rx filled and sent to patient pharmacy 

## 2020-12-15 ENCOUNTER — Encounter (HOSPITAL_BASED_OUTPATIENT_CLINIC_OR_DEPARTMENT_OTHER): Payer: Self-pay | Admitting: Physical Therapy

## 2020-12-15 ENCOUNTER — Other Ambulatory Visit: Payer: Self-pay

## 2020-12-15 ENCOUNTER — Ambulatory Visit (HOSPITAL_BASED_OUTPATIENT_CLINIC_OR_DEPARTMENT_OTHER): Payer: Medicare PPO | Attending: Registered Nurse | Admitting: Physical Therapy

## 2020-12-15 DIAGNOSIS — M5442 Lumbago with sciatica, left side: Secondary | ICD-10-CM | POA: Diagnosis not present

## 2020-12-15 DIAGNOSIS — M542 Cervicalgia: Secondary | ICD-10-CM | POA: Diagnosis not present

## 2020-12-15 DIAGNOSIS — R293 Abnormal posture: Secondary | ICD-10-CM | POA: Diagnosis not present

## 2020-12-15 NOTE — Therapy (Signed)
OUTPATIENT PHYSICAL THERAPY TREATMENT NOTE   Patient Name: Dominique Harrell MRN: [id number] DOB:02/14/1956, 65 y.o., female 17 Date: 12/15/2020  PCP: Midge Minium, MD REFERRING PROVIDER: Maximiano Coss, NP   PT End of Session - 12/15/20 1013     Visit Number 6    Number of Visits 13    Date for PT Re-Evaluation 01/07/21    Authorization Type [employer] MCR    PT Start Time 4166    PT Stop Time 1050    PT Time Calculation (min) 35 min    Activity Tolerance Patient tolerated treatment well    Behavior During Therapy WFL for tasks assessed/performed               Past Medical History:  Diagnosis Date   Hyperlipidemia    Insomnia    Migraine    Osteopenia    Past Surgical History:  Procedure Laterality Date   COLONOSCOPY  08/14/2007   w/Dr.Stark   excision of liposarcoma thigh Left    removed at [facility]; drop in Blood pressure per pt   OOPHORECTOMY  1999   left, dermoid cyst   Patient Active Problem List   Diagnosis Date Noted   Lentigo 06/17/2020   Melanocytic nevi of trunk 06/17/2020   Other seborrheic keratosis 06/17/2020   Lumbar back pain with radiculopathy affecting right lower extremity 04/07/2015   Osteoporosis, 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   General medical examination 03/02/2011   SHOULDER IMPINGEMENT SYNDROME, LEFT 02/15/2010   NEOPLASM OF UNCERTAIN BEHAVIOR OF SKIN 12/02/2009   Hyperlipidemia 06/08/2006   Depression 06/08/2006   MIGRAINE HEADACHE 06/08/2006   INSOMNIA 06/08/2006    REFERRING DIAG: M54.42 (ICD-10-CM) - Acute left-sided low back pain with left-sided sciatica   THERAPY DIAG:  Acute left-sided low back pain with left-sided sciatica  Abnormal posture  Cervicalgia  PERTINENT HISTORY: chronic back pain  PRECAUTIONS: none  SUBJECTIVE: Lt neck and into skull hurt. I still have a HA. Started Monday night.   PAIN:  Are you having pain? Yes  7/10 Cervical Lt Pain description: aching      OBJECTIVE:  DIAGNOSTIC  FINDINGS:  Lumbar: IMPRESSION: 1. Mild for age spondylosis with endplate spurring. Mild disc space narrowing at L5-S1. 2. Mild lower lumbar facet hypertrophy.       SCREENING FOR RED FLAGS: Bowel or bladder incontinence: No   COGNITION:          Overall cognitive status: Within functional limits for tasks assessed                        SENSATION:          WFL     MUSCLE LENGTH: General tightness and spasm into hamstrings due to back pain   POSTURE:  Guarded with mild incr in kyphosis and forward head    LUMBARAROM/PROM- all painful     Cervical ROM WFL with some tightness at end range 11/2: cervical ROM severely limited due to pain at beg of session.     A/PROM A/PROM  11/26/2020 10/31  Flexion <50%   Extension <50%   Right lateral flexion <50% WFL  Left lateral flexion <50% WFL  Right rotation <50%   Left rotation <50%    (Blank rows = not tested)   LE AROM/PROM:   Hip flexion limited due to spasm   LE MMT: not appropriate to test at eval due to lumbar spasm     MMT Right 12/13/2020 Left 12/13/2020  Hip flexion  4   5  Hip extension      Hip abduction  5 5   Hip adduction      Hip internal rotation      Hip external rotation      Knee flexion      Knee extension      Ankle dorsiflexion      Ankle plantarflexion      Ankle inversion      Ankle eversion       (Blank rows = not tested)       SPINAL SEGMENTAL MOBILITY ASSESSMENT:  Limited mobility grossly     GAIT:   Comments: mild trendelenburg, guarded trunk rotation and lacking arm swing     TODAY'S TREATMENT: 11/2  Skilled palpation and monitoring during TPDN- Lt upper trap, levator scap; manual stretching & STM to cervical region  Moist heat 10 min  10/31  Standing hip hinge with hands on back of chair  Manua: STM to Rt thoracic paraspinals' bil upper traps (more focus on Rt), suboccipitals, manual cervical traction  Prone: scap retraction, retraction + extension  Hooklying- scap  retraction+ab set without tensing through cervical region  90/90 holds with lowering  Deep breathing in supine W  Sidelying mini open books with hand to sternum  10/25  Manual: traction, lateral cervical mobs, STM: suboccipitals, bil upper trap  Chin tuck  Pec stretch in suipine  Sidelying external rotation  Supine shoulder flexion 2lb with ab set in hooklying; iso flexion 90 deg with marching  Alt march to 90/90, hold and both legs lower  10/19  Manual with dry needling: skilled palpation and monitoring (bil upper traps); STM to bil upper traps & levator scap; prone thoracic PA; supine cervical rotation with mob to lower cervical spine; manual cervical traction  Supine protraction reach  Seated upper trap & levator stretch holding 3 breaths  Child pose  10/17:  Manual- post rot of Rt innom 2 min- supine & prone each; STM thoracic/periscap region bilat  Rt knee to Rt shoulder 2 min hold  Seated cervical stretches  Scap retraction  Eval: Hesch self correction for Lt post innom.  Post sacral tilt on towel roll with legs elevated Manual: STM to suboccipitals, Rt to Lt cerevical sideglides, cervical traction  PATIENT EDUCATION:  Education details: anatomy of condition- cervical-lumbar effects along chain   Person educated: Patient Education method: Explanation, Demonstration, Tactile cues, Verbal cues, and Handouts Education comprehension: verbalized understanding, returned demonstration, verbal cues required, tactile cues required, and needs further education     HOME EXERCISE PROGRAM: Hesch self correction for Rt ant innom [id number]   ASSESSMENT:   CLINICAL IMPRESSION: Myofascial HA noted today from trigger point in Lt upper trap. Lt suboccipitals were tender prior to DN upper trap but was not following so this group was not needled. HA was resolved following DN and pt reported she could feel the muscle tightening up while on heat. Encouraged her to continue gentle movement  and practice postural awareness to decrease spasm.       REHAB POTENTIAL: Good   CLINICAL DECISION MAKING: Evolving/moderate complexity   EVALUATION COMPLEXITY: Moderate     GOALS: Goals reviewed with patient? Yes   SHORT TERM GOALS:    STG Name Target Date Goal status  1 Able to demo 50% of lumbar motion Baseline: less due to pain at eval 12/17/2020 achieved      LTG Name Target Date Goal status  1 Able to demo trunk flexion to  mid-shin for proper bending Baseline: unable at eval 01/07/2021 INITIAL  2 Pt will tolerate walking for ADLs without limitaiton by back pain Baseline: limited at eval 01/07/2021 INITIAL  3 Independent with stretches for lumbar spine and lower body Baseline: will progress as appropriate 01/07/2021 INITIAL  4 Independent with long term core strength/stability program to reduce future risk of back "going out" Baseline: will progress as appropriate 01/07/2021 INITIAL                               PLAN: PT FREQUENCY: 1-2x/week   PT DURATION: 6 weeks   PLANNED INTERVENTIONS: Therapeutic exercises, Therapeutic activity, Neuro Muscular re-education, Balance training, Gait training, Patient/Family education, Joint mobilization, Dry Needling, Electrical stimulation, Spinal mobilization, Cryotherapy, Moist heat, Traction, and Manual therapy   PLAN FOR NEXT SESSION: cont hip hinge training, review use of home TENS, return of HA?   Tyger Oka C. Riyaan Heroux PT, DPT 12/15/20 11:05 AM

## 2020-12-19 NOTE — Therapy (Signed)
OUTPATIENT PHYSICAL THERAPY TREATMENT NOTE   Patient Name: Dominique Harrell MRN: [id number] DOB:05-29-1955, 65 y.o., female 96 Date: 12/20/2020  PCP: Midge Minium, MD REFERRING PROVIDER: Maximiano Coss, NP   PT End of Session - 12/20/20 1021     Visit Number 7    Number of Visits 13    Date for PT Re-Evaluation 01/07/21    Authorization Type [employer] MCR    PT Start Time 5456    PT Stop Time 1053    PT Time Calculation (min) 38 min    Activity Tolerance Patient tolerated treatment well    Behavior During Therapy WFL for tasks assessed/performed                Past Medical History:  Diagnosis Date   Hyperlipidemia    Insomnia    Migraine    Osteopenia    Past Surgical History:  Procedure Laterality Date   COLONOSCOPY  08/14/2007   w/Dr.Stark   excision of liposarcoma thigh Left    removed at [facility]; drop in Blood pressure per pt   OOPHORECTOMY  1999   left, dermoid cyst   Patient Active Problem List   Diagnosis Date Noted   Lentigo 06/17/2020   Melanocytic nevi of trunk 06/17/2020   Other seborrheic keratosis 06/17/2020   Lumbar back pain with radiculopathy affecting right lower extremity 04/07/2015   Osteoporosis, 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   General medical examination 03/02/2011   SHOULDER IMPINGEMENT SYNDROME, LEFT 02/15/2010   NEOPLASM OF UNCERTAIN BEHAVIOR OF SKIN 12/02/2009   Hyperlipidemia 06/08/2006   Depression 06/08/2006   MIGRAINE HEADACHE 06/08/2006   INSOMNIA 06/08/2006    REFERRING DIAG: M54.42 (ICD-10-CM) - Acute left-sided low back pain with left-sided sciatica   THERAPY DIAG:  Acute left-sided low back pain with left-sided sciatica  Abnormal posture  Cervicalgia  PERTINENT HISTORY: chronic back pain  PRECAUTIONS: none  SUBJECTIVE: low back feels great, thoracic is achey, neck is just not right. Denies HA  PAIN:  Are you having pain? Yes  4/10 Cervical Lt Pain description: aching      OBJECTIVE:   DIAGNOSTIC FINDINGS:  Lumbar: IMPRESSION: 1. Mild for age spondylosis with endplate spurring. Mild disc space narrowing at L5-S1. 2. Mild lower lumbar facet hypertrophy.       SCREENING FOR RED FLAGS: Bowel or bladder incontinence: No   COGNITION:          Overall cognitive status: Within functional limits for tasks assessed                        SENSATION:          WFL     MUSCLE LENGTH: General tightness and spasm into hamstrings due to back pain   POSTURE:  Guarded with mild incr in kyphosis and forward head    LUMBARAROM/PROM- all painful     Cervical ROM WFL with some tightness at end range 11/2: cervical ROM severely limited due to pain at beg of session.     A/PROM A/PROM  11/26/2020 10/31  Flexion <50%   Extension <50%   Right lateral flexion <50% WFL  Left lateral flexion <50% WFL  Right rotation <50%   Left rotation <50%    (Blank rows = not tested)   LE AROM/PROM:   Hip flexion limited due to spasm   LE MMT: not appropriate to test at eval due to lumbar spasm     MMT Right 12/13/2020 Left 12/13/2020  Hip flexion  4   5  Hip extension      Hip abduction  5 5   Hip adduction      Hip internal rotation      Hip external rotation      Knee flexion      Knee extension      Ankle dorsiflexion      Ankle plantarflexion      Ankle inversion      Ankle eversion       (Blank rows = not tested)       SPINAL SEGMENTAL MOBILITY ASSESSMENT:  Limited mobility grossly     GAIT:   Comments: mild trendelenburg, guarded trunk rotation and lacking arm swing    Cervical AROM- Lt 40 Rt 55; bil to 55 after manual therapy  TODAY'S TREATMENT: 11/5  MANUAL: STM to bil cervical and upper traps musculature, joint mobs: encouraging rotation through cervical vertebra, cervical traction  Seated cervical rotation  11/2  Skilled palpation and monitoring during TPDN- Lt upper trap, levator scap; manual stretching & STM to cervical region  Moist heat 10  min  10/31  Standing hip hinge with hands on back of chair  Manua: STM to Rt thoracic paraspinals' bil upper traps (more focus on Rt), suboccipitals, manual cervical traction  Prone: scap retraction, retraction + extension  Hooklying- scap retraction+ab set without tensing through cervical region  90/90 holds with lowering  Deep breathing in supine W  Sidelying mini open books with hand to sternum  10/25  Manual: traction, lateral cervical mobs, STM: suboccipitals, bil upper trap  Chin tuck  Pec stretch in suipine  Sidelying external rotation  Supine shoulder flexion 2lb with ab set in hooklying; iso flexion 90 deg with marching  Alt march to 90/90, hold and both legs lower  10/19  Manual with dry needling: skilled palpation and monitoring (bil upper traps); STM to bil upper traps & levator scap; prone thoracic PA; supine cervical rotation with mob to lower cervical spine; manual cervical traction  Supine protraction reach  Seated upper trap & levator stretch holding 3 breaths  Child pose  10/17:  Manual- post rot of Rt innom 2 min- supine & prone each; STM thoracic/periscap region bilat  Rt knee to Rt shoulder 2 min hold  Seated cervical stretches  Scap retraction  Eval: Hesch self correction for Lt post innom.  Post sacral tilt on towel roll with legs elevated Manual: STM to suboccipitals, Rt to Lt cerevical sideglides, cervical traction  PATIENT EDUCATION:  Education details: anatomy of condition- cervical-lumbar effects along chain   Person educated: Patient Education method: Explanation, Demonstration, Tactile cues, Verbal cues, and Handouts Education comprehension: verbalized understanding, returned demonstration, verbal cues required, tactile cues required, and needs further education     HOME EXERCISE PROGRAM: Hesch self correction for Rt ant innom [id number]   ASSESSMENT:   CLINICAL IMPRESSION: Extensive manual therapy today to address lack of Lt cervical  rotation. Held DN as she did not have a HA. Able to achieve equal ROM without cavitations following treatment.        REHAB POTENTIAL: Good   CLINICAL DECISION MAKING: Evolving/moderate complexity   EVALUATION COMPLEXITY: Moderate     GOALS: Goals reviewed with patient? Yes   SHORT TERM GOALS:    STG Name Target Date Goal status  1 Able to demo 50% of lumbar motion Baseline: less due to pain at eval 12/17/2020 achieved      LTG Name Target Date Goal status  1  Able to demo trunk flexion to mid-shin for proper bending Baseline: unable at eval 01/07/2021 INITIAL  2 Pt will tolerate walking for ADLs without limitaiton by back pain Baseline: limited at eval 01/07/2021 INITIAL  3 Independent with stretches for lumbar spine and lower body Baseline: will progress as appropriate 01/07/2021 INITIAL  4 Independent with long term core strength/stability program to reduce future risk of back "going out" Baseline: will progress as appropriate 01/07/2021 INITIAL                               PLAN: PT FREQUENCY: 1-2x/week   PT DURATION: 6 weeks   PLANNED INTERVENTIONS: Therapeutic exercises, Therapeutic activity, Neuro Muscular re-education, Balance training, Gait training, Patient/Family education, Joint mobilization, Dry Needling, Electrical stimulation, Spinal mobilization, Cryotherapy, Moist heat, Traction, and Manual therapy   PLAN FOR NEXT SESSION: cont hip hinge training, review use of home TENS, return of HA?   Kalima Saylor C. Amylee Lodato PT, DPT 12/20/20 10:57 AM

## 2020-12-20 ENCOUNTER — Ambulatory Visit (HOSPITAL_BASED_OUTPATIENT_CLINIC_OR_DEPARTMENT_OTHER): Payer: Medicare PPO | Admitting: Physical Therapy

## 2020-12-20 ENCOUNTER — Other Ambulatory Visit: Payer: Self-pay

## 2020-12-20 ENCOUNTER — Encounter (HOSPITAL_BASED_OUTPATIENT_CLINIC_OR_DEPARTMENT_OTHER): Payer: Self-pay | Admitting: Physical Therapy

## 2020-12-20 DIAGNOSIS — R293 Abnormal posture: Secondary | ICD-10-CM

## 2020-12-20 DIAGNOSIS — M542 Cervicalgia: Secondary | ICD-10-CM

## 2020-12-20 DIAGNOSIS — M5442 Lumbago with sciatica, left side: Secondary | ICD-10-CM

## 2020-12-22 ENCOUNTER — Encounter (HOSPITAL_BASED_OUTPATIENT_CLINIC_OR_DEPARTMENT_OTHER): Payer: Medicare PPO | Admitting: Physical Therapy

## 2020-12-27 ENCOUNTER — Ambulatory Visit (HOSPITAL_BASED_OUTPATIENT_CLINIC_OR_DEPARTMENT_OTHER): Payer: Medicare PPO | Admitting: Physical Therapy

## 2020-12-29 ENCOUNTER — Encounter (HOSPITAL_BASED_OUTPATIENT_CLINIC_OR_DEPARTMENT_OTHER): Payer: Medicare PPO | Admitting: Physical Therapy

## 2021-01-03 ENCOUNTER — Encounter (HOSPITAL_BASED_OUTPATIENT_CLINIC_OR_DEPARTMENT_OTHER): Payer: Medicare PPO | Admitting: Physical Therapy

## 2021-01-05 ENCOUNTER — Encounter: Payer: BC Managed Care – PPO | Admitting: Family Medicine

## 2021-01-05 ENCOUNTER — Encounter (HOSPITAL_BASED_OUTPATIENT_CLINIC_OR_DEPARTMENT_OTHER): Payer: Medicare PPO | Admitting: Physical Therapy

## 2021-01-10 ENCOUNTER — Encounter (HOSPITAL_BASED_OUTPATIENT_CLINIC_OR_DEPARTMENT_OTHER): Payer: Medicare PPO | Admitting: Physical Therapy

## 2021-01-12 ENCOUNTER — Ambulatory Visit (HOSPITAL_BASED_OUTPATIENT_CLINIC_OR_DEPARTMENT_OTHER): Payer: Medicare PPO | Admitting: Physical Therapy

## 2021-01-27 ENCOUNTER — Ambulatory Visit: Payer: Medicare PPO | Admitting: Registered Nurse

## 2021-01-27 ENCOUNTER — Encounter: Payer: Self-pay | Admitting: Family Medicine

## 2021-01-27 ENCOUNTER — Ambulatory Visit: Payer: Medicare PPO | Admitting: Family Medicine

## 2021-01-27 VITALS — BP 110/78 | HR 80 | Temp 97.9°F | Resp 16 | Wt 113.8 lb

## 2021-01-27 DIAGNOSIS — R058 Other specified cough: Secondary | ICD-10-CM | POA: Diagnosis not present

## 2021-01-27 MED ORDER — ALBUTEROL SULFATE HFA 108 (90 BASE) MCG/ACT IN AERS: 2.0000 | INHALATION_SPRAY | Freq: Four times a day (QID) | RESPIRATORY_TRACT | 0 refills | Status: DC | PRN

## 2021-01-27 NOTE — Patient Instructions (Addendum)
Follow up as scheduled for your physical TAKE THE DARN COUGH SYRUP Use the Albuterol as needed- 2 puffs every 4-6 hrs Call with any questions or concerns Stay Safe!  Stay Healthy! Happy Holidays!

## 2021-01-27 NOTE — Progress Notes (Signed)
° °  Subjective:    Patient ID: Dominique Harrell, female    DOB: 1955-04-11, 65 y.o.   MRN: [id number]  HPI Cough/congestion- had COVID 3 week ago and most sxs have resolved w/ exception of intermittent dry cough.  Cough worsens w/ deep breaths or when lying down.  Denies SOB.  'i feel fine except this cough'   Review of Systems For ROS see HPI   This visit occurred during the SARS-CoV-2 public health emergency.  Safety protocols were in place, including screening questions prior to the visit, additional usage of staff PPE, and extensive cleaning of exam room while observing appropriate contact time as indicated for disinfecting solutions.      Objective:   Physical Exam Vitals reviewed.  Constitutional:      General: She is not in acute distress.    Appearance: Normal appearance. She is not ill-appearing.  HENT:     Head: Normocephalic and atraumatic.  Eyes:     Extraocular Movements: Extraocular movements intact.     Conjunctiva/sclera: Conjunctivae normal.     Pupils: Pupils are equal, round, and reactive to light.  Cardiovascular:     Rate and Rhythm: Normal rate and regular rhythm.  Pulmonary:     Effort: Pulmonary effort is normal. No respiratory distress.     Breath sounds: Normal breath sounds. No stridor. No wheezing or rhonchi.     Comments: + dry cough- worsens w/ deep breath Skin:    General: Skin is warm and dry.  Neurological:     General: No focal deficit present.     Mental Status: She is alert and oriented to person, place, and time.  Psychiatric:        Mood and Affect: Mood normal.        Behavior: Behavior normal.        Thought Content: Thought content normal.          Assessment & Plan:  Post- viral cough- new.  Pt recently had COVID and all sxs have resolved w/ exception of cough and chest tightness when lying down.  No evidence of DVT- HR WNL, O2 level WNL.  Suspect this is airway inflammation.  She is not interested in Prednisone.  Will start  albuterol prn.  Pt expressed understanding and is in agreement w/ plan.

## 2021-02-02 ENCOUNTER — Encounter: Payer: Self-pay | Admitting: Family Medicine

## 2021-02-02 ENCOUNTER — Ambulatory Visit (INDEPENDENT_AMBULATORY_CARE_PROVIDER_SITE_OTHER): Payer: Medicare PPO | Admitting: Family Medicine

## 2021-02-02 VITALS — BP 100/78 | HR 63 | Temp 98.6°F | Resp 16 | Ht 63.5 in | Wt 111.0 lb

## 2021-02-02 DIAGNOSIS — Z114 Encounter for screening for human immunodeficiency virus [HIV]: Secondary | ICD-10-CM | POA: Diagnosis not present

## 2021-02-02 DIAGNOSIS — E785 Hyperlipidemia, unspecified: Secondary | ICD-10-CM

## 2021-02-02 DIAGNOSIS — Z0000 Encounter for general adult medical examination without abnormal findings: Secondary | ICD-10-CM | POA: Diagnosis not present

## 2021-02-02 DIAGNOSIS — M810 Age-related osteoporosis without current pathological fracture: Secondary | ICD-10-CM

## 2021-02-02 LAB — CBC WITH DIFFERENTIAL/PLATELET
Basophils Absolute: 0 10*3/uL (ref 0.0–0.1)
Basophils Relative: 0.4 % (ref 0.0–3.0)
Eosinophils Absolute: 0.1 10*3/uL (ref 0.0–0.7)
Eosinophils Relative: 1.5 % (ref 0.0–5.0)
HCT: 40.5 % (ref 36.0–46.0)
Hemoglobin: 13.6 g/dL (ref 12.0–15.0)
Lymphocytes Relative: 31.7 % (ref 12.0–46.0)
Lymphs Abs: 1.6 10*3/uL (ref 0.7–4.0)
MCHC: 33.5 g/dL (ref 30.0–36.0)
MCV: 95.1 fl (ref 78.0–100.0)
Monocytes Absolute: 0.3 10*3/uL (ref 0.1–1.0)
Monocytes Relative: 5.7 % (ref 3.0–12.0)
Neutro Abs: 3.1 10*3/uL (ref 1.4–7.7)
Neutrophils Relative %: 60.7 % (ref 43.0–77.0)
Platelets: 203 10*3/uL (ref 150.0–400.0)
RBC: 4.25 Mil/uL (ref 3.87–5.11)
RDW: 12.9 % (ref 11.5–15.5)
WBC: 5 10*3/uL (ref 4.0–10.5)

## 2021-02-02 LAB — TSH: TSH: 1.32 u[IU]/mL (ref 0.35–5.50)

## 2021-02-02 LAB — LIPID PANEL
Cholesterol: 170 mg/dL (ref 0–200)
HDL: 65.5 mg/dL (ref >39.00)
LDL Cholesterol: 90 mg/dL (ref 0–99)
NonHDL: 104.89
Total CHOL/HDL Ratio: 3
Triglycerides: 75 mg/dL (ref 0.0–149.0)
VLDL: 15 mg/dL (ref 0.0–40.0)

## 2021-02-02 LAB — HEPATIC FUNCTION PANEL
ALT: 24 U/L (ref 0–35)
AST: 21 U/L (ref 0–37)
Albumin: 4.7 g/dL (ref 3.5–5.2)
Alkaline Phosphatase: 45 U/L (ref 39–117)
Bilirubin, Direct: 0.2 mg/dL (ref 0.0–0.3)
Total Bilirubin: 0.9 mg/dL (ref 0.2–1.2)
Total Protein: 7.3 g/dL (ref 6.0–8.3)

## 2021-02-02 LAB — BASIC METABOLIC PANEL
BUN: 13 mg/dL (ref 6–23)
CO2: 31 mEq/L (ref 19–32)
Calcium: 9.7 mg/dL (ref 8.4–10.5)
Chloride: 104 mEq/L (ref 96–112)
Creatinine, Ser: 0.67 mg/dL (ref 0.40–1.20)
GFR: 91.39 mL/min (ref >60.00)
Glucose, Bld: 83 mg/dL (ref 70–99)
Potassium: 4.2 mEq/L (ref 3.5–5.1)
Sodium: 142 mEq/L (ref 135–145)

## 2021-02-02 LAB — VITAMIN D 25 HYDROXY (VIT D DEFICIENCY, FRACTURES): VITD: 62.97 ng/mL (ref 30.00–100.00)

## 2021-02-02 NOTE — Assessment & Plan Note (Signed)
Check Vit D and replete prn. 

## 2021-02-02 NOTE — Assessment & Plan Note (Signed)
Pt's PE WNL.  UTD on pap, mammo, colonoscopy, immunizations.  Check labs.  Anticipatory guidance provided.  

## 2021-02-02 NOTE — Progress Notes (Signed)
Subjective:    Patient ID: Dominique Harrell, female    DOB: Jul 10, 1955, 65 y.o.   MRN: [id number]  HPI Here today for Welcome to Medicare CPE.  Risk Factors: Hyperlipidemia- chronic problem, on Simvastatin 40mg  daily Osteoporosis- UTD on DEXA Physical Activity: exercising regularly Fall Risk: low Depression: denies current sxs Hearing: normal to conversational tones and whispered voice ADL's: independent Cognitive: normal linear thought process, memory and attention intact Home Safety: safe at home, lives w/ husband Tom Height, Weight, BMI, Visual Acuity: see vitals, vision corrected to 20/20 w/ glasses Counseling: UTD on colonoscopy, mammo, DEXA, Prevnar Substance abuse risk: pt is not on any controlled medication, does not smoke or drink alcohol regularly Health Care POA/Living Will: has healthcare POA, unsure about living will Labs Ordered: See A&P Care Plan: See A&P   Patient Care Team    Relationship Specialty Notifications Start End  Midge Minium, MD PCP - General   01/26/10   Servando Salina, MD Consulting Physician Obstetrics and Gynecology  11/02/14     Health Maintenance  Topic Date Due   HIV Screening  Never done   COVID-19 Vaccine (4 - Booster for Pfizer series) 02/12/2021 (Originally 02/27/2020)   MAMMOGRAM  09/08/2021 (Originally 03/23/2020)   Pneumonia Vaccine 48+ Years old (2 - PPSV23 if available, else PCV20) 06/29/2021   PAP SMEAR-Modifier  03/29/2022   DEXA SCAN  04/06/2022   COLONOSCOPY (Pts 45-40yrs Insurance coverage will need to be confirmed)  03/05/2028   TETANUS/TDAP  03/11/2029   INFLUENZA VACCINE  Completed   Hepatitis C Screening  Completed   Zoster Vaccines- Shingrix  Completed   HPV VACCINES  Aged Out      Review of Systems Patient reports no vision/ hearing changes, adenopathy,fever, weight change,  persistant/recurrent hoarseness , swallowing issues, chest pain, palpitations, edema, persistant/recurrent cough, hemoptysis,  dyspnea (rest/exertional/paroxysmal nocturnal), gastrointestinal bleeding (melena, rectal bleeding), abdominal pain, significant heartburn, bowel changes, GU symptoms (dysuria, hematuria, incontinence), Gyn symptoms (abnormal  bleeding, pain),  syncope, focal weakness, memory loss, numbness & tingling, skin/hair/nail changes, abnormal bruising or bleeding, anxiety, or depression.   This visit occurred during the SARS-CoV-2 public health emergency.  Safety protocols were in place, including screening questions prior to the visit, additional usage of staff PPE, and extensive cleaning of exam room while observing appropriate contact time as indicated for disinfecting solutions.      Objective:   Physical Exam General Appearance:    Alert, cooperative, no distress, appears stated age  Head:    Normocephalic, without obvious abnormality, atraumatic  Eyes:    PERRL, conjunctiva/corneas clear, EOM's intact, fundi    benign, both eyes  Ears:    Normal TM's and external ear canals, both ears  Nose:   Deferred due to COVID  Throat:   Neck:   Supple, symmetrical, trachea midline, no adenopathy;    Thyroid: no enlargement/tenderness/nodules  Back:     Symmetric, no curvature, ROM normal, no CVA tenderness  Lungs:     Clear to auscultation bilaterally, respirations unlabored  Chest Wall:    No tenderness or deformity   Heart:    Regular rate and rhythm, S1 and S2 normal, no murmur, rub   or gallop  Breast Exam:    Deferred to GYN  Abdomen:     Soft, non-tender, bowel sounds active all four quadrants,    no masses, no organomegaly  Genitalia:    Deferred to GYN  Rectal:    Extremities:   Extremities normal, atraumatic,  no cyanosis or edema  Pulses:   2+ and symmetric all extremities  Skin:   Skin color, texture, turgor normal, no rashes or lesions  Lymph nodes:   Cervical, supraclavicular, and axillary nodes normal  Neurologic:   CNII-XII intact, normal strength, sensation and reflexes    throughout           Assessment & Plan:

## 2021-02-02 NOTE — Patient Instructions (Signed)
Follow up in 6 months to recheck cholesterol We'll notify you of your lab results and make any changes if needed Keep up the good work!  You look great!! Call with any questions or concerns Stay Safe!  Stay Healthy! Happy Holidays!!! 

## 2021-02-02 NOTE — Assessment & Plan Note (Signed)
Chronic problem.  Tolerating statin w/o difficulty.  Check labs.  Adjust meds prn  

## 2021-02-03 ENCOUNTER — Telehealth: Payer: Self-pay

## 2021-02-03 LAB — HIV ANTIBODY (ROUTINE TESTING W REFLEX): HIV 1&2 Ab, 4th Generation: NONREACTIVE

## 2021-02-03 NOTE — Telephone Encounter (Signed)
LVM for patient letting her know her results were great. Invited to call back with any questions

## 2021-02-03 NOTE — Telephone Encounter (Signed)
-----   Message from Midge Minium, MD sent at 02/03/2021  7:24 AM EST ----- Your labs look AMAZING!!  Keep up the good work!!  (But don't lose any more weight!)

## 2021-02-17 DIAGNOSIS — H04123 Dry eye syndrome of bilateral lacrimal glands: Secondary | ICD-10-CM | POA: Diagnosis not present

## 2021-02-18 ENCOUNTER — Other Ambulatory Visit: Payer: Self-pay | Admitting: Family Medicine

## 2021-02-21 ENCOUNTER — Other Ambulatory Visit: Payer: Self-pay

## 2021-02-21 MED ORDER — ALBUTEROL SULFATE HFA 108 (90 BASE) MCG/ACT IN AERS: 2.0000 | INHALATION_SPRAY | Freq: Four times a day (QID) | RESPIRATORY_TRACT | 1 refills | Status: DC | PRN

## 2021-04-12 DIAGNOSIS — Z1231 Encounter for screening mammogram for malignant neoplasm of breast: Secondary | ICD-10-CM | POA: Diagnosis not present

## 2021-04-12 LAB — HM MAMMOGRAPHY

## 2021-04-14 ENCOUNTER — Encounter: Payer: Self-pay | Admitting: Family Medicine

## 2021-05-19 ENCOUNTER — Encounter: Payer: Self-pay | Admitting: Family Medicine

## 2021-05-19 NOTE — Telephone Encounter (Signed)
Patient immunization entered in the chart. Attempted to call patient for additional information  ?

## 2021-06-15 ENCOUNTER — Other Ambulatory Visit: Payer: Self-pay

## 2021-06-15 ENCOUNTER — Telehealth: Payer: Self-pay | Admitting: Family Medicine

## 2021-06-15 DIAGNOSIS — E785 Hyperlipidemia, unspecified: Secondary | ICD-10-CM

## 2021-06-15 DIAGNOSIS — E1169 Type 2 diabetes mellitus with other specified complication: Secondary | ICD-10-CM

## 2021-06-15 MED ORDER — SIMVASTATIN 40 MG PO TABS: ORAL_TABLET | ORAL | 1 refills | Status: DC

## 2021-06-15 NOTE — Telephone Encounter (Signed)
Pt called in asking for a refill on the simvastatin  ? ?Please advise  ?

## 2021-06-15 NOTE — Telephone Encounter (Signed)
Rx refill has been sent to pharmacy  ?

## 2021-06-29 ENCOUNTER — Ambulatory Visit (INDEPENDENT_AMBULATORY_CARE_PROVIDER_SITE_OTHER): Payer: Medicare PPO | Admitting: Family Medicine

## 2021-06-29 ENCOUNTER — Ambulatory Visit: Payer: Medicare PPO

## 2021-06-29 DIAGNOSIS — Z23 Encounter for immunization: Secondary | ICD-10-CM | POA: Diagnosis not present

## 2021-06-29 NOTE — Patient Instructions (Signed)
Dominique Harrell is a 66 y.o. female presents to the office today for Pneumococcal  23  injections, per physician's orders . Given in right deltoid . Pt tolerated well.  ?Eli Phillips  ?

## 2021-06-29 NOTE — Progress Notes (Signed)
Dominique Harrell is a 66 y.o. female presents to the office today for Pneumo 23 injections, per physician's orders.   Eli Phillips

## 2021-06-30 ENCOUNTER — Ambulatory Visit: Payer: Medicare PPO

## 2021-07-12 ENCOUNTER — Encounter: Payer: Self-pay | Admitting: Family Medicine

## 2021-07-12 ENCOUNTER — Ambulatory Visit: Payer: Medicare PPO | Admitting: Family Medicine

## 2021-07-12 VITALS — BP 116/72 | HR 64 | Temp 97.7°F | Resp 18 | Ht 63.5 in | Wt 116.2 lb

## 2021-07-12 DIAGNOSIS — H6982 Other specified disorders of Eustachian tube, left ear: Secondary | ICD-10-CM | POA: Diagnosis not present

## 2021-07-12 NOTE — Progress Notes (Signed)
   Subjective:    Patient ID: Dominique Harrell, female    DOB: [birth year], 66 y.o.   MRN: [id number]  HPI Ear pain- sxs started ~5 days ago.  L ear.  Sxs have been intermittent.  Most sever on Friday and Saturday.  Sxs started to ease yesterday.  No fever.  No drainage.  No change in hearing.  R ear w/o issue.  + PND.  Denies sinus pain/pressure.  Was previously using Flonase but had stopped thinking allergy season was over.   Review of Systems For ROS see HPI     Objective:   Physical Exam Vitals reviewed.  Constitutional:      General: She is not in acute distress.    Appearance: Normal appearance. She is well-developed. She is not ill-appearing.  HENT:     Head: Normocephalic and atraumatic.     Right Ear: Tympanic membrane and ear canal normal.     Left Ear: Tympanic membrane and ear canal normal.     Nose: Mucosal edema and congestion present. No rhinorrhea.     Right Sinus: No maxillary sinus tenderness or frontal sinus tenderness.     Left Sinus: No maxillary sinus tenderness or frontal sinus tenderness.     Mouth/Throat:     Pharynx: Posterior oropharyngeal erythema (w/ PND) present.  Eyes:     Conjunctiva/sclera: Conjunctivae normal.     Pupils: Pupils are equal, round, and reactive to light.  Pulmonary:     Effort: Pulmonary effort is normal. No respiratory distress.  Musculoskeletal:     Cervical back: Normal range of motion and neck supple.  Lymphadenopathy:     Cervical: No cervical adenopathy.  Skin:    General: Skin is warm and dry.  Neurological:     General: No focal deficit present.     Mental Status: She is alert and oriented to person, place, and time.  Psychiatric:        Mood and Affect: Mood normal.        Behavior: Behavior normal.        Thought Content: Thought content normal.          Assessment & Plan:  Eustachian tube dysfxn- new.  L ear.  Pt had stopped her Flonase thinking allergy season was over.  Encouraged her to resume this and add  daily OTC antihistamine until feeling better.  Reviewed supportive care and red flags that should prompt return.  Pt expressed understanding and is in agreement w/ plan.

## 2021-07-12 NOTE — Patient Instructions (Addendum)
Follow up as needed or as scheduled Restart Flonase daily ADD Claritin or Zyrtec daily until feeling better Drink LOTS of fluids Call with any questions or concerns Hang in there!!!

## 2021-08-05 ENCOUNTER — Encounter: Payer: Self-pay | Admitting: Family Medicine

## 2021-08-05 ENCOUNTER — Ambulatory Visit: Payer: Medicare PPO | Admitting: Family Medicine

## 2021-08-05 VITALS — BP 118/76 | HR 92 | Temp 97.4°F | Resp 16 | Ht 63.5 in | Wt 115.0 lb

## 2021-08-05 DIAGNOSIS — E785 Hyperlipidemia, unspecified: Secondary | ICD-10-CM

## 2021-08-05 DIAGNOSIS — G4700 Insomnia, unspecified: Secondary | ICD-10-CM | POA: Diagnosis not present

## 2021-08-05 LAB — BASIC METABOLIC PANEL
BUN: 18 mg/dL (ref 6–23)
CO2: 29 mEq/L (ref 19–32)
Calcium: 9.3 mg/dL (ref 8.4–10.5)
Chloride: 107 mEq/L (ref 96–112)
Creatinine, Ser: 0.71 mg/dL (ref 0.40–1.20)
GFR: 88.59 mL/min (ref >60.00)
Glucose, Bld: 84 mg/dL (ref 70–99)
Potassium: 4.2 mEq/L (ref 3.5–5.1)
Sodium: 143 mEq/L (ref 135–145)

## 2021-08-05 LAB — LIPID PANEL
Cholesterol: 154 mg/dL (ref 0–200)
HDL: 67.6 mg/dL (ref >39.00)
LDL Cholesterol: 72 mg/dL (ref 0–99)
NonHDL: 86.39
Total CHOL/HDL Ratio: 2
Triglycerides: 74 mg/dL (ref 0.0–149.0)
VLDL: 14.8 mg/dL (ref 0.0–40.0)

## 2021-08-05 LAB — HEPATIC FUNCTION PANEL
ALT: 24 U/L (ref 0–35)
AST: 22 U/L (ref 0–37)
Albumin: 4.5 g/dL (ref 3.5–5.2)
Alkaline Phosphatase: 37 U/L — ABNORMAL LOW (ref 39–117)
Bilirubin, Direct: 0.2 mg/dL (ref 0.0–0.3)
Total Bilirubin: 0.8 mg/dL (ref 0.2–1.2)
Total Protein: 6.9 g/dL (ref 6.0–8.3)

## 2021-08-08 ENCOUNTER — Telehealth: Payer: Self-pay

## 2021-08-08 NOTE — Telephone Encounter (Signed)
Informed pt of lab results  

## 2021-09-12 ENCOUNTER — Other Ambulatory Visit: Payer: Self-pay

## 2021-09-12 DIAGNOSIS — E785 Hyperlipidemia, unspecified: Secondary | ICD-10-CM

## 2021-09-12 MED ORDER — SIMVASTATIN 40 MG PO TABS: ORAL_TABLET | ORAL | 1 refills | Status: DC

## 2021-09-20 ENCOUNTER — Encounter: Payer: Self-pay | Admitting: Family Medicine

## 2021-11-15 DIAGNOSIS — Z483 Aftercare following surgery for neoplasm: Secondary | ICD-10-CM | POA: Diagnosis not present

## 2021-11-15 DIAGNOSIS — C4922 Malignant neoplasm of connective and soft tissue of left lower limb, including hip: Secondary | ICD-10-CM | POA: Diagnosis not present

## 2021-12-12 DIAGNOSIS — D2271 Melanocytic nevi of right lower limb, including hip: Secondary | ICD-10-CM | POA: Diagnosis not present

## 2021-12-12 DIAGNOSIS — L570 Actinic keratosis: Secondary | ICD-10-CM | POA: Diagnosis not present

## 2021-12-12 DIAGNOSIS — Z129 Encounter for screening for malignant neoplasm, site unspecified: Secondary | ICD-10-CM | POA: Diagnosis not present

## 2021-12-12 DIAGNOSIS — L821 Other seborrheic keratosis: Secondary | ICD-10-CM | POA: Diagnosis not present

## 2021-12-12 DIAGNOSIS — D2272 Melanocytic nevi of left lower limb, including hip: Secondary | ICD-10-CM | POA: Diagnosis not present

## 2022-02-22 ENCOUNTER — Telehealth: Payer: Self-pay

## 2022-02-22 ENCOUNTER — Encounter: Payer: Self-pay | Admitting: Family Medicine

## 2022-02-22 ENCOUNTER — Encounter: Payer: Medicare PPO | Admitting: Family Medicine

## 2022-02-22 ENCOUNTER — Ambulatory Visit (INDEPENDENT_AMBULATORY_CARE_PROVIDER_SITE_OTHER): Payer: Medicare PPO | Admitting: Family Medicine

## 2022-02-22 VITALS — BP 116/68 | HR 71 | Temp 98.4°F | Resp 17 | Ht 63.5 in | Wt 117.1 lb

## 2022-02-22 DIAGNOSIS — E785 Hyperlipidemia, unspecified: Secondary | ICD-10-CM

## 2022-02-22 DIAGNOSIS — R0981 Nasal congestion: Secondary | ICD-10-CM | POA: Diagnosis not present

## 2022-02-22 DIAGNOSIS — M810 Age-related osteoporosis without current pathological fracture: Secondary | ICD-10-CM

## 2022-02-22 DIAGNOSIS — Z0000 Encounter for general adult medical examination without abnormal findings: Secondary | ICD-10-CM | POA: Diagnosis not present

## 2022-02-22 LAB — BASIC METABOLIC PANEL
BUN: 17 mg/dL (ref 6–23)
CO2: 27 mEq/L (ref 19–32)
Calcium: 9.5 mg/dL (ref 8.4–10.5)
Chloride: 107 mEq/L (ref 96–112)
Creatinine, Ser: 0.83 mg/dL (ref 0.40–1.20)
GFR: 73.17 mL/min (ref >60.00)
Glucose, Bld: 97 mg/dL (ref 70–99)
Potassium: 4.5 mEq/L (ref 3.5–5.1)
Sodium: 144 mEq/L (ref 135–145)

## 2022-02-22 LAB — LIPID PANEL
Cholesterol: 169 mg/dL (ref 0–200)
HDL: 68.9 mg/dL (ref >39.00)
LDL Cholesterol: 82 mg/dL (ref 0–99)
NonHDL: 100.33
Total CHOL/HDL Ratio: 2
Triglycerides: 90 mg/dL (ref 0.0–149.0)
VLDL: 18 mg/dL (ref 0.0–40.0)

## 2022-02-22 LAB — CBC WITH DIFFERENTIAL/PLATELET
Basophils Absolute: 0 10*3/uL (ref 0.0–0.1)
Basophils Relative: 0.4 % (ref 0.0–3.0)
Eosinophils Absolute: 0.1 10*3/uL (ref 0.0–0.7)
Eosinophils Relative: 1.5 % (ref 0.0–5.0)
HCT: 43.2 % (ref 36.0–46.0)
Hemoglobin: 14.5 g/dL (ref 12.0–15.0)
Lymphocytes Relative: 26.3 % (ref 12.0–46.0)
Lymphs Abs: 2.1 10*3/uL (ref 0.7–4.0)
MCHC: 33.7 g/dL (ref 30.0–36.0)
MCV: 94.5 fl (ref 78.0–100.0)
Monocytes Absolute: 0.4 10*3/uL (ref 0.1–1.0)
Monocytes Relative: 5.1 % (ref 3.0–12.0)
Neutro Abs: 5.2 10*3/uL (ref 1.4–7.7)
Neutrophils Relative %: 66.7 % (ref 43.0–77.0)
Platelets: 210 10*3/uL (ref 150.0–400.0)
RBC: 4.57 Mil/uL (ref 3.87–5.11)
RDW: 12.7 % (ref 11.5–15.5)
WBC: 7.8 10*3/uL (ref 4.0–10.5)

## 2022-02-22 LAB — HEPATIC FUNCTION PANEL
ALT: 27 U/L (ref 0–35)
AST: 21 U/L (ref 0–37)
Albumin: 4.7 g/dL (ref 3.5–5.2)
Alkaline Phosphatase: 42 U/L (ref 39–117)
Bilirubin, Direct: 0.1 mg/dL (ref 0.0–0.3)
Total Bilirubin: 0.7 mg/dL (ref 0.2–1.2)
Total Protein: 7.4 g/dL (ref 6.0–8.3)

## 2022-02-22 LAB — VITAMIN D 25 HYDROXY (VIT D DEFICIENCY, FRACTURES): VITD: 50.79 ng/mL (ref 30.00–100.00)

## 2022-02-22 LAB — TSH: TSH: 2.99 u[IU]/mL (ref 0.35–5.50)

## 2022-02-22 MED ORDER — FLUTICASONE PROPIONATE 50 MCG/ACT NA SUSP: NASAL | 3 refills | Status: DC

## 2022-02-22 NOTE — Assessment & Plan Note (Signed)
Check Vit D and replete prn. 

## 2022-02-22 NOTE — Assessment & Plan Note (Signed)
Chronic problem.  Tolerating statin w/o difficulty.  Check labs.  Adjust meds prn  

## 2022-02-22 NOTE — Telephone Encounter (Signed)
Informed pt of lab results  

## 2022-02-22 NOTE — Patient Instructions (Signed)
Follow up in 6 months to recheck cholesterol We'll notify you of your lab results and make any changes if needed Keep up the good work!  You look great!! Call with any questions or concerns Stay Safe!  Stay Healthy! Happy Early Rudene Anda!!!

## 2022-02-22 NOTE — Progress Notes (Signed)
   Subjective:    Patient ID: Dominique Harrell, female    DOB: [birth year], 67 y.o.   MRN: [id number]  HPI CPE- UTD on DEXA, mammo, colonoscopy, Tdap, PNA, flu.  Pt reports feeling good.  Patient Care Team    Relationship Specialty Notifications Start End  Midge Minium, MD PCP - General   01/26/10   Servando Salina, MD Consulting Physician Obstetrics and Gynecology  11/02/14     Health Maintenance  Topic Date Due   Medicare Annual Wellness (AWV)  Never done   DEXA SCAN  04/06/2022   MAMMOGRAM  04/12/2022   COLONOSCOPY (Pts 45-12yr Insurance coverage will need to be confirmed)  03/05/2028   DTaP/Tdap/Td (4 - Td or Tdap) 03/11/2029   Pneumonia Vaccine 67 Years old  Completed   INFLUENZA VACCINE  Completed   Hepatitis C Screening  Completed   Zoster Vaccines- Shingrix  Completed   HPV VACCINES  Aged Out   COVID-19 Vaccine  Discontinued      Review of Systems Patient reports no vision/ hearing changes, adenopathy,fever, weight change,  persistant/recurrent hoarseness , swallowing issues, chest pain, palpitations, edema, persistant/recurrent cough, hemoptysis, dyspnea (rest/exertional/paroxysmal nocturnal), gastrointestinal bleeding (melena, rectal bleeding), abdominal pain, significant heartburn, bowel changes, GU symptoms (dysuria, hematuria, incontinence), Gyn symptoms (abnormal  bleeding, pain),  syncope, focal weakness, memory loss, numbness & tingling, skin/hair/nail changes, abnormal bruising or bleeding, anxiety, or depression.     Objective:   Physical Exam General Appearance:    Alert, cooperative, no distress, appears stated age  Head:    Normocephalic, without obvious abnormality, atraumatic  Eyes:    PERRL, conjunctiva/corneas clear, EOM's intact both eyes  Ears:    Normal TM's and external ear canals, both ears  Nose:   Nares normal, septum midline, mucosa normal, no drainage    or sinus tenderness  Throat:   Lips, mucosa, and tongue normal; teeth and gums  normal  Neck:   Supple, symmetrical, trachea midline, no adenopathy;    Thyroid: no enlargement/tenderness/nodules  Back:     Symmetric, no curvature, ROM normal, no CVA tenderness  Lungs:     Clear to auscultation bilaterally, respirations unlabored  Chest Wall:    No tenderness or deformity   Heart:    Regular rate and rhythm, S1 and S2 normal, no murmur, rub   or gallop  Breast Exam:    Deferred to GYN  Abdomen:     Soft, non-tender, bowel sounds active all four quadrants,    no masses, no organomegaly  Genitalia:    Deferred to GYN  Rectal:    Extremities:   Extremities normal, atraumatic, no cyanosis or edema  Pulses:   2+ and symmetric all extremities  Skin:   Skin color, texture, turgor normal, no rashes or lesions  Lymph nodes:   Cervical, supraclavicular, and axillary nodes normal  Neurologic:   CNII-XII intact, normal strength, sensation and reflexes    throughout          Assessment & Plan:

## 2022-02-22 NOTE — Telephone Encounter (Signed)
-----   Message from Midge Minium, MD sent at 02/22/2022  3:55 PM EST ----- Labs look great!  No changes at this time

## 2022-02-22 NOTE — Assessment & Plan Note (Signed)
Pt's PE WNL.  UTD on pap, mammo, colonoscopy, DEXA, immunizations.  Check labs.  Anticipatory guidance provided.

## 2022-02-26 ENCOUNTER — Encounter: Payer: Self-pay | Admitting: Family Medicine

## 2022-03-14 ENCOUNTER — Telehealth: Payer: Self-pay | Admitting: Family Medicine

## 2022-03-14 NOTE — Telephone Encounter (Signed)
Left message for patient to call back and schedule Medicare Annual Wellness Visit (AWV).   Please offer to do virtually or by telephone.  Left office number and my jabber [phone].  Welcome to Medicare: 02/02/2021   Please schedule at anytime with Nurse Health Advisor.

## 2022-04-25 DIAGNOSIS — Z1231 Encounter for screening mammogram for malignant neoplasm of breast: Secondary | ICD-10-CM | POA: Diagnosis not present

## 2022-04-25 LAB — HM MAMMOGRAPHY

## 2022-04-28 ENCOUNTER — Encounter: Payer: Self-pay | Admitting: Family Medicine

## 2022-05-04 ENCOUNTER — Telehealth: Payer: Self-pay | Admitting: Family Medicine

## 2022-05-04 NOTE — Telephone Encounter (Signed)
Called patient to schedule Medicare Annual Wellness Visit (AWV). Left message for patient to call back and schedule Medicare Annual Wellness Visit (AWV).  Last date of AWV: Welcome to Medicare: 02/02/2021   Please schedule an AWVI appointment at any time with [facility] VISIT.  If any questions, please contact me at [phone].    Thank you,  Bonny Doon Direct dial  [phone]

## 2022-05-17 DIAGNOSIS — N950 Postmenopausal bleeding: Secondary | ICD-10-CM | POA: Diagnosis not present

## 2022-05-21 ENCOUNTER — Encounter: Payer: Self-pay | Admitting: Family Medicine

## 2022-05-22 ENCOUNTER — Other Ambulatory Visit: Payer: Self-pay

## 2022-05-22 DIAGNOSIS — M542 Cervicalgia: Secondary | ICD-10-CM

## 2022-05-23 DIAGNOSIS — N950 Postmenopausal bleeding: Secondary | ICD-10-CM | POA: Diagnosis not present

## 2022-05-23 DIAGNOSIS — N930 Postcoital and contact bleeding: Secondary | ICD-10-CM | POA: Diagnosis not present

## 2022-05-23 DIAGNOSIS — N952 Postmenopausal atrophic vaginitis: Secondary | ICD-10-CM | POA: Diagnosis not present

## 2022-05-29 ENCOUNTER — Telehealth: Payer: Self-pay | Admitting: Family Medicine

## 2022-05-29 NOTE — Telephone Encounter (Signed)
Contacted Wynona Canes to schedule their annual wellness visit. Appointment made for 4.18.2024.  Thank you,  [facility] Support [facility] Direct dial  [phone]

## 2022-06-08 ENCOUNTER — Other Ambulatory Visit: Payer: Self-pay

## 2022-06-08 DIAGNOSIS — E785 Hyperlipidemia, unspecified: Secondary | ICD-10-CM

## 2022-06-08 MED ORDER — SIMVASTATIN 40 MG PO TABS: ORAL_TABLET | ORAL | 1 refills | Status: DC

## 2022-06-20 ENCOUNTER — Telehealth: Payer: Self-pay | Admitting: Family Medicine

## 2022-06-20 NOTE — Telephone Encounter (Signed)
Called patient to schedule Medicare Annual Wellness Visit (AWV). Left message for patient to call back and schedule Medicare Annual Wellness Visit (AWV).  Last date of AWV: Welcome to Medicare: 02/02/2021   Please schedule an AWVI appointment at any time with [facility] ANNUAL WELLNESS VISIT.  If any questions, please contact me at [phone].    Thank you,  [facility] Support [facility] Direct dial  [phone]

## 2022-06-26 ENCOUNTER — Other Ambulatory Visit: Payer: Self-pay

## 2022-06-26 ENCOUNTER — Ambulatory Visit (HOSPITAL_BASED_OUTPATIENT_CLINIC_OR_DEPARTMENT_OTHER): Payer: Medicare PPO | Attending: Family Medicine | Admitting: Physical Therapy

## 2022-06-26 ENCOUNTER — Encounter (HOSPITAL_BASED_OUTPATIENT_CLINIC_OR_DEPARTMENT_OTHER): Payer: Self-pay | Admitting: Physical Therapy

## 2022-06-26 DIAGNOSIS — R293 Abnormal posture: Secondary | ICD-10-CM | POA: Diagnosis not present

## 2022-06-26 DIAGNOSIS — M542 Cervicalgia: Secondary | ICD-10-CM | POA: Diagnosis not present

## 2022-06-26 DIAGNOSIS — M5442 Lumbago with sciatica, left side: Secondary | ICD-10-CM | POA: Insufficient documentation

## 2022-06-26 NOTE — Therapy (Signed)
OUTPATIENT PHYSICAL THERAPY EVALUATION   Patient Name: Dominique Harrell MRN: [id number] DOB:01/08/1956, 67 y.o., female Today's Date: 06/26/2022  END OF SESSION:  PT End of Session - 06/26/22 1632     Visit Number 1    Number of Visits 8    Date for PT Re-Evaluation 07/29/22    Authorization Type [employer] MCR    PT Start Time 1524    PT Stop Time 1555    PT Time Calculation (min) 31 min    Activity Tolerance Patient tolerated treatment well    Behavior During Therapy WFL for tasks assessed/performed             Past Medical History:  Diagnosis Date   Hyperlipidemia    Insomnia    Migraine    Osteopenia    Past Surgical History:  Procedure Laterality Date   COLONOSCOPY  08/14/2007   w/Dr.Stark   excision of liposarcoma thigh Left    removed at [facility]; drop in Blood pressure per pt   OOPHORECTOMY  1999   left, dermoid cyst   Patient Active Problem List   Diagnosis Date Noted   Lumbar back pain with radiculopathy affecting right lower extremity 04/07/2015   Osteoporosis, 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   General medical examination 03/02/2011   SHOULDER IMPINGEMENT SYNDROME, LEFT 02/15/2010   Hyperlipidemia 06/08/2006   Depression 06/08/2006   MIGRAINE HEADACHE 06/08/2006    REFERRING PROVIDER:  Sheliah Hatch, MD    REFERRING DIAG: M54.2 (ICD-10-CM) - Neck pain   Rationale for Evaluation and Treatment: Rehabilitation  THERAPY DIAG:  Cervicalgia  ONSET DATE: 5 weeks ago   SUBJECTIVE:                                                                                                                                                                                           SUBJECTIVE STATEMENT: I think I strained it when I was cleaning out my jeep. Lt cervical region was where it started and now in thoracic region.   PERTINENT HISTORY:  See PMH  PAIN:  Are you having pain? Yes: NPRS scale: minimal/10 Pain location: Lt cervical spine, thoracic  spine Pain description: tight Aggravating factors: Sidebending Relieving factors: Heat  PRECAUTIONS: None  WEIGHT BEARING RESTRICTIONS: No  FALLS:  Has patient fallen in last 6 months? No   OCCUPATION: Retired, volunteers at [employer]  PLOF: Independent  PATIENT GOALS: Decrease pain  OBJECTIVE:   DIAGNOSTIC FINDINGS:  Xray 09/08/20: IMPRESSION: 1. Multilevel degenerative disc disease and facet hypertrophy throughout the cervical spine. 2. Bilateral bony neural foraminal narrowing at C5-C6, suspected right neural foraminal narrowing at C4-C5.  PATIENT SURVEYS:  EVAL: FOTO 43    SENSATION: WFL  POSTURE:  Bilateral winging scapula   Body Part #1 Cervical  PALPATION: Eval: Spasm noted along bilateral cervical spine with more tenderness to palpation of the right side  Cervical ROM CERVICAL ROM:   Active ROM A/PROM (deg) eval  Flexion   Extension   Right lateral flexion 14  Left lateral flexion 28  Right rotation 48  Left rotation 40   (Blank rows = not tested)    TREATMENT:                                                                                                                               DATE: EVAL 5/13   Sheet over shoulder for first rib mob in upper trap stretch  Cervical rotation SNAG  MANUAL: STM Rt upper trap, suboccipitals bil, bil cervical paraspinals; manual cervical traction, lateral cervical mobs paired with rotation; suboccipital release paired with chin tucks  Open book  Cat/camel/child pose   PATIENT EDUCATION:  Education details: Anatomy of condition, POC, HEP, exercise form/rationale Person educated: Patient Education method: Explanation, Demonstration, Tactile cues, Verbal cues, and Handouts Education comprehension: verbalized understanding, returned demonstration, verbal cues required, tactile cues required, and needs further education  HOME EXERCISE PROGRAM: [email or web address]   ASSESSMENT:  CLINICAL  IMPRESSION: Patient is a 67y.o. female who was seen today for physical therapy evaluation and treatment for return of cervicalgia.  I treated the patient in 2022 for back pain that has since resolved.  At this time her signs and symptoms are concordant with a right cervical facet limitation resulting in excessive movement and pain on the left side of her spine.  Was able to improve cervical rotation bilaterally to a difference of less than 5 degrees following manual therapy.  Patient also reported less crunching in her neck as she turned her head.  Will return on Saturday for a follow-up appointment to recheck mobility and progress thoracic strengthening exercises to reduce scapular winging.   OBJECTIVE IMPAIRMENTS: decreased activity tolerance, decreased ROM, hypomobility, increased muscle spasms, impaired flexibility, impaired UE functional use, improper body mechanics, postural dysfunction, and pain.   ACTIVITY LIMITATIONS: carrying, lifting, sleeping, reach over head, and caring for others  PARTICIPATION LIMITATIONS: meal prep, cleaning, laundry, driving, shopping, community activity, and occupation  PERSONAL FACTORS:  Osteoporosis, history of left shoulder impingement  are also affecting patient's functional outcome.   REHAB POTENTIAL: Good  CLINICAL DECISION MAKING: Stable/uncomplicated  EVALUATION COMPLEXITY: Low   GOALS: Goals reviewed with patient? Yes  SHORT TERM GOALS: Target date: 07/08/22  Patient will be independent in mobility exercises without elevation of shoulders Baseline: Goal status: INITIAL   LONG TERM GOALS: Target date: POC date  Cervical sidebending rotation within 5 degrees left to right Baseline:  Goal status: INITIAL  2.  Resolution of left cervical spinal pain Baseline:  Goal status: INITIAL  3.  Independent with continued exercises for cervicothoracic stability  and postural support Baseline:  Goal status: INITIAL  PLAN:  PT FREQUENCY:  1-2x/week  PT DURATION: 4 weeks  PLANNED INTERVENTIONS: Therapeutic exercises, Therapeutic activity, Neuromuscular re-education, Gait training, Patient/Family education, Self Care, Joint mobilization, Dry Needling, Electrical stimulation, Spinal mobilization, Cryotherapy, Moist heat, Taping, Traction, Manual therapy, and Re-evaluation.  PLAN FOR NEXT SESSION: Continue manual therapy, dry needling as needed, periscapular stability exercises   Mariellen Blaney C. Raphaela Cannaday PT, DPT 06/26/22 4:44 PM   Referring diagnosis? M54.2 (ICD-10-CM) - Neck pain  Treatment diagnosis? (if different than referring diagnosis) M54.2 What was this (referring dx) caused by? []  Surgery []  Fall []  Ongoing issue [x]  Arthritis []  Other: ____________  Laterality: []  Rt []  Lt [x]  Both  Check all possible CPT codes:  *CHOOSE 10 OR LESS*    [x]  97110 (Therapeutic Exercise)  []  92507 (SLP Treatment)  []  97112 (Neuro Re-ed)   []  92526 (Swallowing Treatment)   []  97116 (Gait Training)   []  [id number] (Cognitive Training, 1st 15 minutes) [x]  97140 (Manual Therapy)   []  97130 (Cognitive Training, each add'l 15 minutes)  [x]  97164 (Re-evaluation)                              []  Other, List CPT Code ____________  [x]  97530 (Therapeutic Activities)     [x]  97535 (Self Care)   []  All codes above (97110 - 97535)  []  97012 (Mechanical Traction)  []  97014 (E-stim Unattended)  []  97032 (E-stim manual)  []  97033 (Ionto)  []  97035 (Ultrasound) [x]  97750 (Physical Performance Training) []  [id number] (Aquatic Therapy) []  97016 (Vasopneumatic Device) []  [id number] (Paraffin) []  97034 (Contrast Bath) []  97597 (Wound Care 1st 20 sq cm) []  97598 (Wound Care each add'l 20 sq cm) []  97760 (Orthotic Fabrication, Fitting, Training Initial) []  [id number] (Prosthetic Management and Training Initial) []  [id number] (Orthotic or Prosthetic Training/ Modification Subsequent)

## 2022-06-27 ENCOUNTER — Encounter: Payer: Self-pay | Admitting: Family Medicine

## 2022-06-28 ENCOUNTER — Telehealth: Payer: Self-pay | Admitting: Family Medicine

## 2022-06-28 ENCOUNTER — Other Ambulatory Visit: Payer: Self-pay

## 2022-06-28 NOTE — Telephone Encounter (Signed)
Called patient to schedule Medicare Annual Wellness Visit (AWV). Left message for patient to call back and schedule Medicare Annual Wellness Visit (AWV).  Last date of AWV: Welcome to Medicare: 02/02/2021   Please schedule an AWVI appointment at any time with [facility] ANNUAL WELLNESS VISIT.  If any questions, please contact me at [phone].    Thank you,  Marieta Markov  Ambulatory Clinic Support [facility] Direct dial  [phone]   

## 2022-07-01 ENCOUNTER — Ambulatory Visit (HOSPITAL_BASED_OUTPATIENT_CLINIC_OR_DEPARTMENT_OTHER): Payer: Medicare PPO | Admitting: Physical Therapy

## 2022-07-01 ENCOUNTER — Encounter (HOSPITAL_BASED_OUTPATIENT_CLINIC_OR_DEPARTMENT_OTHER): Payer: Self-pay | Admitting: Physical Therapy

## 2022-07-01 DIAGNOSIS — M5442 Lumbago with sciatica, left side: Secondary | ICD-10-CM | POA: Diagnosis not present

## 2022-07-01 DIAGNOSIS — M542 Cervicalgia: Secondary | ICD-10-CM

## 2022-07-01 DIAGNOSIS — R293 Abnormal posture: Secondary | ICD-10-CM | POA: Diagnosis not present

## 2022-07-01 NOTE — Therapy (Signed)
OUTPATIENT PHYSICAL THERAPY TREATMENT   Patient Name: Dominique Harrell MRN: [id number] DOB:[birth year], 67 y.o., female Today's Date: 07/01/2022  END OF SESSION:  PT End of Session - 07/01/22 0834     Visit Number 2    Number of Visits 8    Date for PT Re-Evaluation 07/29/22    Authorization Type [employer] MCR    PT Start Time 0830    PT Stop Time 0908    PT Time Calculation (min) 38 min    Activity Tolerance Patient tolerated treatment well    Behavior During Therapy [facility] for tasks assessed/performed             Past Medical History:  Diagnosis Date   Hyperlipidemia    Insomnia    Migraine    Osteopenia    Past Surgical History:  Procedure Laterality Date   COLONOSCOPY  08/14/2007   w/Dr.Stark   excision of liposarcoma thigh Left    removed at [facility]; drop in Blood pressure per pt   OOPHORECTOMY  1999   left, dermoid cyst   Patient Active Problem List   Diagnosis Date Noted   Lumbar back pain with radiculopathy affecting right lower extremity 04/07/2015   Osteoporosis, 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   General medical examination 03/02/2011   SHOULDER IMPINGEMENT SYNDROME, LEFT 02/15/2010   Hyperlipidemia 06/08/2006   Depression 06/08/2006   MIGRAINE HEADACHE 06/08/2006    REFERRING PROVIDER:  Sheliah Hatch, MD    REFERRING DIAG: M54.2 (ICD-10-CM) - Neck pain   Rationale for Evaluation and Treatment: Rehabilitation  THERAPY DIAG:  Cervicalgia  ONSET DATE: 5 weeks ago   SUBJECTIVE:                                                                                                                                                                                           SUBJECTIVE STATEMENT: I was doing ok until I think I did a little too much. Some tightness in left upper trap.   PERTINENT HISTORY:  See PMH  PAIN:  Are you having pain? Yes: NPRS scale: minimal/10 Pain location: Lt cervical spine, thoracic spine Pain description: tight Aggravating  factors: Sidebending Relieving factors: Heat  PRECAUTIONS: None  WEIGHT BEARING RESTRICTIONS: No  FALLS:  Has patient fallen in last 6 months? No   OCCUPATION: Retired, volunteers at [employer]  PLOF: Independent  PATIENT GOALS: Decrease pain  OBJECTIVE:   DIAGNOSTIC FINDINGS:  Xray 09/08/20: IMPRESSION: 1. Multilevel degenerative disc disease and facet hypertrophy throughout the cervical spine. 2. Bilateral bony neural foraminal narrowing at C5-C6, suspected right neural foraminal narrowing at C4-C5.  PATIENT SURVEYS:  EVAL: FOTO  43    SENSATION: WFL  POSTURE:  Bilateral winging scapula   Body Part #1 Cervical  PALPATION: Eval: Spasm noted along bilateral cervical spine with more tenderness to palpation of the right side  Cervical ROM CERVICAL ROM:   Active ROM A/PROM (deg) eval  Flexion   Extension   Right lateral flexion 14  Left lateral flexion 28  Right rotation 48  Left rotation 40   (Blank rows = not tested)    TREATMENT:                                                                                                                               Treatment                            :  Trigger Point Dry Needling, Manual Therapy Treatment:  Initial or subsequent education regarding Trigger Point Dry Needling: Subsequent Did patient give consent to treatment with Trigger Point Dry Needling: Yes TPDN with skilled palpation and monitoring followed by STM to the following muscles: Lt upper trap, suboccipitals  STM Lt SCM, cervical traction with rotation, suboccipital relaease, Lt first rib mobilizations, prone thoracic mobs on Left side SCM stretch Hip hinge, Added 10lb row to stand with scap retraction/core/gluts   DATE: EVAL 5/13   Sheet over shoulder for first rib mob in upper trap stretch  Cervical rotation SNAG  MANUAL: STM Rt upper trap, suboccipitals bil, bil cervical paraspinals; manual cervical traction, lateral  cervical mobs paired with rotation; suboccipital release paired with chin tucks  Open book  Cat/camel/child pose   PATIENT EDUCATION:  Education details: Anatomy of condition, POC, HEP, exercise form/rationale Person educated: Patient Education method: Explanation, Demonstration, Tactile cues, Verbal cues, and Handouts Education comprehension: verbalized understanding, returned demonstration, verbal cues required, tactile cues required, and needs further education  HOME EXERCISE PROGRAM: [id number]   ASSESSMENT:  CLINICAL IMPRESSION: Concordant pain from upper trap & sternal-SCM. Will progress POC at this point due to reoccurence of cervical tightness. Pt reports she owuld ultimately like to be able to lift a bucket of water for horses from a hook at waist height.    OBJECTIVE IMPAIRMENTS: decreased activity tolerance, decreased ROM, hypomobility, increased muscle spasms, impaired flexibility, impaired UE functional use, improper body mechanics, postural dysfunction, and pain.   ACTIVITY LIMITATIONS: carrying, lifting, sleeping, reach over head, and caring for others  PARTICIPATION LIMITATIONS: meal prep, cleaning, laundry, driving, shopping, community activity, and occupation  PERSONAL FACTORS:  Osteoporosis, history of left shoulder impingement  are also affecting patient's functional outcome.   REHAB POTENTIAL: Good  CLINICAL DECISION MAKING: Stable/uncomplicated  EVALUATION COMPLEXITY: Low   GOALS: Goals reviewed with patient? Yes  SHORT TERM GOALS: Target date: 07/08/22  Patient will be independent in mobility exercises without elevation of shoulders Baseline: Goal status: INITIAL   LONG TERM GOALS: Target date: POC date  Cervical sidebending rotation within 5  degrees left to right Baseline:  Goal status: INITIAL  2.  Resolution of left cervical spinal pain Baseline:  Goal status: INITIAL  3.  Independent with continued exercises for cervicothoracic stability  and postural support Baseline:  Goal status: INITIAL  PLAN:  PT FREQUENCY: 1-2x/week  PT DURATION: 4 weeks  PLANNED INTERVENTIONS: Therapeutic exercises, Therapeutic activity, Neuromuscular re-education, Gait training, Patient/Family education, Self Care, Joint mobilization, Dry Needling, Electrical stimulation, Spinal mobilization, Cryotherapy, Moist heat, Taping, Traction, Manual therapy, and Re-evaluation.  PLAN FOR NEXT SESSION: Continue manual therapy, dry needling as needed, periscapular stability exercises   Desirre Eickhoff C. Aleea Hendry PT, DPT 07/01/22 9:09 AM   Referring diagnosis? M54.2 (ICD-10-CM) - Neck pain  Treatment diagnosis? (if different than referring diagnosis) M54.2 What was this (referring dx) caused by? []  Surgery []  Fall []  Ongoing issue [x]  Arthritis []  Other: ____________  Laterality: []  Rt []  Lt [x]  Both  Check all possible CPT codes:  *CHOOSE 10 OR LESS*    [x]  97110 (Therapeutic Exercise)  []  92507 (SLP Treatment)  []  97112 (Neuro Re-ed)   []  92526 (Swallowing Treatment)   []  97116 (Gait Training)   []  [id number] (Cognitive Training, 1st 15 minutes) [x]  97140 (Manual Therapy)   []  97130 (Cognitive Training, each add'l 15 minutes)  [x]  97164 (Re-evaluation)                              []  Other, List CPT Code ____________  [x]  97530 (Therapeutic Activities)     [x]  97535 (Self Care)   []  All codes above (97110 - 97535)  []  97012 (Mechanical Traction)  []  97014 (E-stim Unattended)  []  97032 (E-stim manual)  []  97033 (Ionto)  []  97035 (Ultrasound) [x]  97750 (Physical Performance Training) []  [id number] (Aquatic Therapy) []  97016 (Vasopneumatic Device) []  [id number] (Paraffin) []  97034 (Contrast Bath) []  97597 (Wound Care 1st 20 sq cm) []  97598 (Wound Care each add'l 20 sq cm) []  97760 (Orthotic Fabrication, Fitting, Training Initial) []  [id number] (Prosthetic Management and Training Initial) []  [id number] (Orthotic or Prosthetic Training/ Modification Subsequent)

## 2022-07-07 ENCOUNTER — Encounter (HOSPITAL_BASED_OUTPATIENT_CLINIC_OR_DEPARTMENT_OTHER): Payer: Self-pay | Admitting: Physical Therapy

## 2022-07-07 NOTE — Therapy (Signed)
OUTPATIENT PHYSICAL THERAPY TREATMENT   Patient Name: Dominique Harrell MRN: [id number] DOB:1955-06-09, 67 y.o., female Today's Date: 07/08/2022  END OF SESSION:  PT End of Session - 07/08/22 0916     Visit Number 3    Number of Visits 8    Date for PT Re-Evaluation 07/29/22    Authorization Type [employer] MCR    PT Start Time 0915    PT Stop Time 0958    PT Time Calculation (min) 43 min    Activity Tolerance Patient tolerated treatment well    Behavior During Therapy [facility] for tasks assessed/performed             Past Medical History:  Diagnosis Date   Hyperlipidemia    Insomnia    Migraine    Osteopenia    Past Surgical History:  Procedure Laterality Date   COLONOSCOPY  08/14/2007   w/Dr.Stark   excision of liposarcoma thigh Left    removed at [facility]; drop in Blood pressure per pt   OOPHORECTOMY  1999   left, dermoid cyst   Patient Active Problem List   Diagnosis Date Noted   Lumbar back pain with radiculopathy affecting right lower extremity 04/07/2015   Osteoporosis, 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   General medical examination 03/02/2011   SHOULDER IMPINGEMENT SYNDROME, LEFT 02/15/2010   Hyperlipidemia 06/08/2006   Depression 06/08/2006   MIGRAINE HEADACHE 06/08/2006    REFERRING PROVIDER:  Sheliah Hatch, MD    REFERRING DIAG: M54.2 (ICD-10-CM) - Neck pain   Rationale for Evaluation and Treatment: Rehabilitation  THERAPY DIAG:  Cervicalgia  Acute left-sided low back pain with left-sided sciatica  Abnormal posture  ONSET DATE: 5 weeks ago   SUBJECTIVE:                                                                                                                                                                                           SUBJECTIVE STATEMENT: Denies HA, just feeling stiff.   PERTINENT HISTORY:  See PMH  PAIN:  Are you having pain? Yes: NPRS scale: minimal/10 Pain location: Lt cervical spine, thoracic spine Pain  description: tight Aggravating factors: Sidebending Relieving factors: Heat  PRECAUTIONS: None  WEIGHT BEARING RESTRICTIONS: No  FALLS:  Has patient fallen in last 6 months? No   OCCUPATION: Retired, volunteers at [employer]  PLOF: Independent  PATIENT GOALS: Decrease pain  OBJECTIVE:   DIAGNOSTIC FINDINGS:  Xray 09/08/20: IMPRESSION: 1. Multilevel degenerative disc disease and facet hypertrophy throughout the cervical spine. 2. Bilateral bony neural foraminal narrowing at C5-C6, suspected right neural foraminal narrowing at C4-C5.  PATIENT SURVEYS:  EVAL: FOTO 43  SENSATION: WFL  POSTURE:  Bilateral winging scapula   Body Part #1 Cervical  PALPATION: Eval: Spasm noted along bilateral cervical spine with more tenderness to palpation of the right side  Cervical ROM CERVICAL ROM:   Active ROM A/PROM (deg) eval 5/25  Flexion    Extension    Right lateral flexion 14 20  Left lateral flexion 28 24  Right rotation 48 54  Left rotation 40 50   (Blank rows = not tested)    TREATMENT:                                                                                                                               Treatment                            5/25:  Trigger Point Dry Needling, Manual Therapy Treatment:  Initial or subsequent education regarding Trigger Point Dry Needling: Subsequent Did patient give consent to treatment with Trigger Point Dry Needling: Yes TPDN with skilled palpation and monitoring followed by STM to the following muscles: Lt upper trap distal and at C3-4, levator scap, mid trap over t4-8 rt ribs  Prone rib mobs PA  and inf of bil first ribs Prone Y, T Seated red tband ER Seated thoracic ext in chair   Treatment                            :  Trigger Point Dry Needling, Manual Therapy Treatment:  Initial or subsequent education regarding Trigger Point Dry Needling: Subsequent Did patient give consent to treatment  with Trigger Point Dry Needling: Yes TPDN with skilled palpation and monitoring followed by STM to the following muscles: Lt upper trap, suboccipitals  STM Lt SCM, cervical traction with rotation, suboccipital relaease, Lt first rib mobilizations, prone thoracic mobs on Left side SCM stretch Hip hinge, Added 10lb row to stand with scap retraction/core/gluts   DATE: EVAL 5/13   Sheet over shoulder for first rib mob in upper trap stretch  Cervical rotation SNAG  MANUAL: STM Rt upper trap, suboccipitals bil, bil cervical paraspinals; manual cervical traction, lateral cervical mobs paired with rotation; suboccipital release paired with chin tucks  Open book  Cat/camel/child pose   PATIENT EDUCATION:  Education details: Anatomy of condition, POC, HEP, exercise form/rationale Person educated: Patient Education method: Explanation, Demonstration, Tactile cues, Verbal cues, and Handouts Education comprehension: verbalized understanding, returned demonstration, verbal cues required, tactile cues required, and needs further education  HOME EXERCISE PROGRAM: [email or web address]   ASSESSMENT:  CLINICAL IMPRESSION: Improved ROM prior to session indicates improved mechanics. Spasm noted on right side resulting in limited thoracic rib motion and improved with treatment. Expect that she will be sore but was able to demo proper activation of periscap musculature for retraining exercises.    OBJECTIVE IMPAIRMENTS: decreased activity tolerance, decreased ROM, hypomobility, increased muscle spasms,  impaired flexibility, impaired UE functional use, improper body mechanics, postural dysfunction, and pain.   ACTIVITY LIMITATIONS: carrying, lifting, sleeping, reach over head, and caring for others  PARTICIPATION LIMITATIONS: meal prep, cleaning, laundry, driving, shopping, community activity, and occupation  PERSONAL FACTORS:  Osteoporosis, history of left shoulder impingement  are also affecting patient's  functional outcome.   REHAB POTENTIAL: Good  CLINICAL DECISION MAKING: Stable/uncomplicated  EVALUATION COMPLEXITY: Low   GOALS: Goals reviewed with patient? Yes  SHORT TERM GOALS: Target date: 07/08/22  Patient will be independent in mobility exercises without elevation of shoulders Baseline: Goal status: INITIAL   LONG TERM GOALS: Target date: POC date  Cervical sidebending rotation within 5 degrees left to right Baseline:  Goal status: INITIAL  2.  Resolution of left cervical spinal pain Baseline:  Goal status: INITIAL  3.  Independent with continued exercises for cervicothoracic stability and postural support Baseline:  Goal status: INITIAL  PLAN:  PT FREQUENCY: 1-2x/week  PT DURATION: 4 weeks  PLANNED INTERVENTIONS: Therapeutic exercises, Therapeutic activity, Neuromuscular re-education, Gait training, Patient/Family education, Self Care, Joint mobilization, Dry Needling, Electrical stimulation, Spinal mobilization, Cryotherapy, Moist heat, Taping, Traction, Manual therapy, and Re-evaluation.  PLAN FOR NEXT SESSION: Continue manual therapy, dry needling as needed, periscapular stability exercises   Lachlan Mckim C. Reagen Haberman PT, DPT 07/08/22 10:00 AM   Referring diagnosis? M54.2 (ICD-10-CM) - Neck pain  Treatment diagnosis? (if different than referring diagnosis) M54.2 What was this (referring dx) caused by? []  Surgery []  Fall []  Ongoing issue [x]  Arthritis []  Other: ____________  Laterality: []  Rt []  Lt [x]  Both  Check all possible CPT codes:  *CHOOSE 10 OR LESS*    [x]  97110 (Therapeutic Exercise)  []  92507 (SLP Treatment)  []  97112 (Neuro Re-ed)   []  92526 (Swallowing Treatment)   []  97116 (Gait Training)   []  [id number] (Cognitive Training, 1st 15 minutes) [x]  97140 (Manual Therapy)   []  97130 (Cognitive Training, each add'l 15 minutes)  [x]  97164 (Re-evaluation)                              []  Other, List CPT Code ____________  [x]  97530 (Therapeutic  Activities)     [x]  97535 (Self Care)   []  All codes above (97110 - 97535)  []  97012 (Mechanical Traction)  []  97014 (E-stim Unattended)  []  97032 (E-stim manual)  []  97033 (Ionto)  []  97035 (Ultrasound) [x]  97750 (Physical Performance Training) []  [id number] (Aquatic Therapy) []  97016 (Vasopneumatic Device) []  [id number] (Paraffin) []  97034 (Contrast Bath) []  97597 (Wound Care 1st 20 sq cm) []  97598 (Wound Care each add'l 20 sq cm) []  97760 (Orthotic Fabrication, Fitting, Training Initial) []  [id number] (Prosthetic Management and Training Initial) []  [id number] (Orthotic or Prosthetic Training/ Modification Subsequent)

## 2022-07-08 ENCOUNTER — Encounter (HOSPITAL_BASED_OUTPATIENT_CLINIC_OR_DEPARTMENT_OTHER): Payer: Self-pay | Admitting: Physical Therapy

## 2022-07-08 ENCOUNTER — Ambulatory Visit (HOSPITAL_BASED_OUTPATIENT_CLINIC_OR_DEPARTMENT_OTHER): Payer: Medicare PPO | Admitting: Physical Therapy

## 2022-07-08 DIAGNOSIS — R293 Abnormal posture: Secondary | ICD-10-CM

## 2022-07-08 DIAGNOSIS — M542 Cervicalgia: Secondary | ICD-10-CM | POA: Diagnosis not present

## 2022-07-08 DIAGNOSIS — M5442 Lumbago with sciatica, left side: Secondary | ICD-10-CM | POA: Diagnosis not present

## 2022-07-09 IMAGING — CR DG LUMBAR SPINE COMPLETE 4+V
5 series · 5 of 5 positions shown · non-contrast
Comparison: None.

CLINICAL DATA: Lumbar back pain with radiculopathy affecting right
lower extremity. History of sciatic pain and degenerative disc
disease.

EXAM:
LUMBAR SPINE - COMPLETE 4+ VIEW

[w lumbar spine ap]
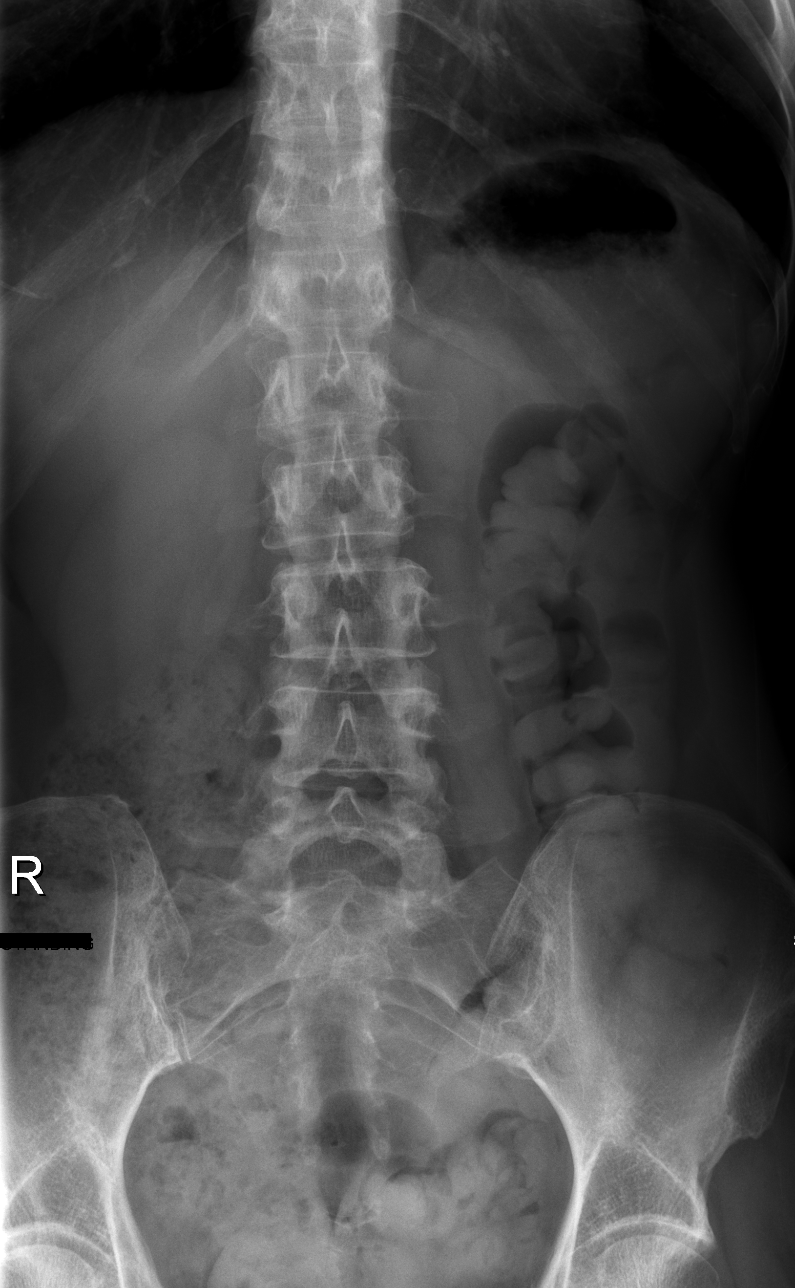

[w lumbar spine obl (1 of 2)]
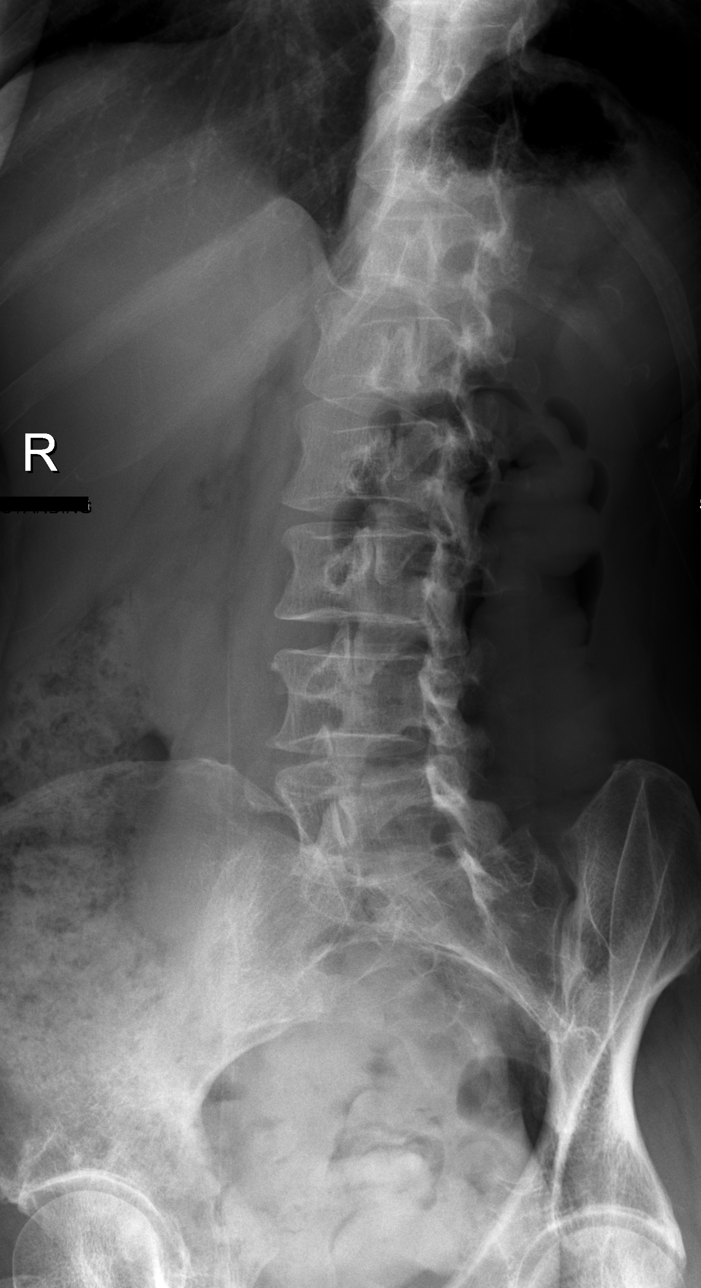

[w lumbar spine obl (2 of 2)]
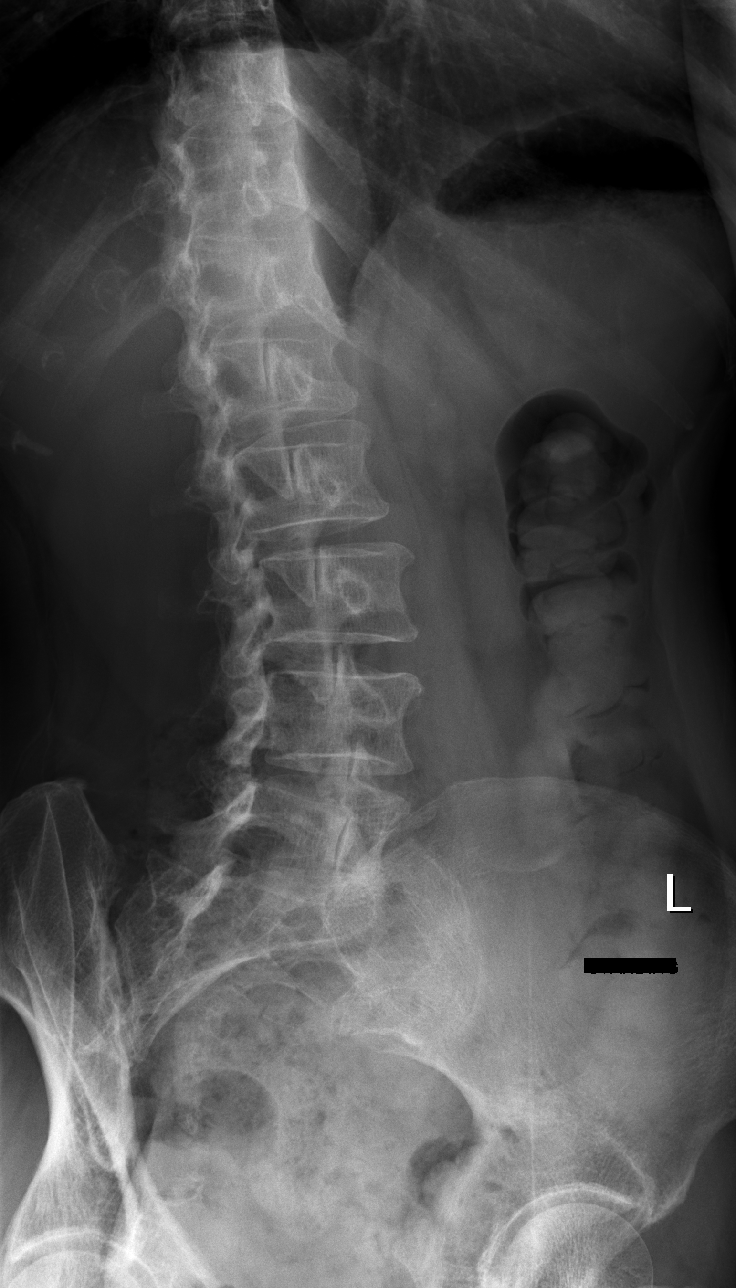

[w lumbar spine lat]
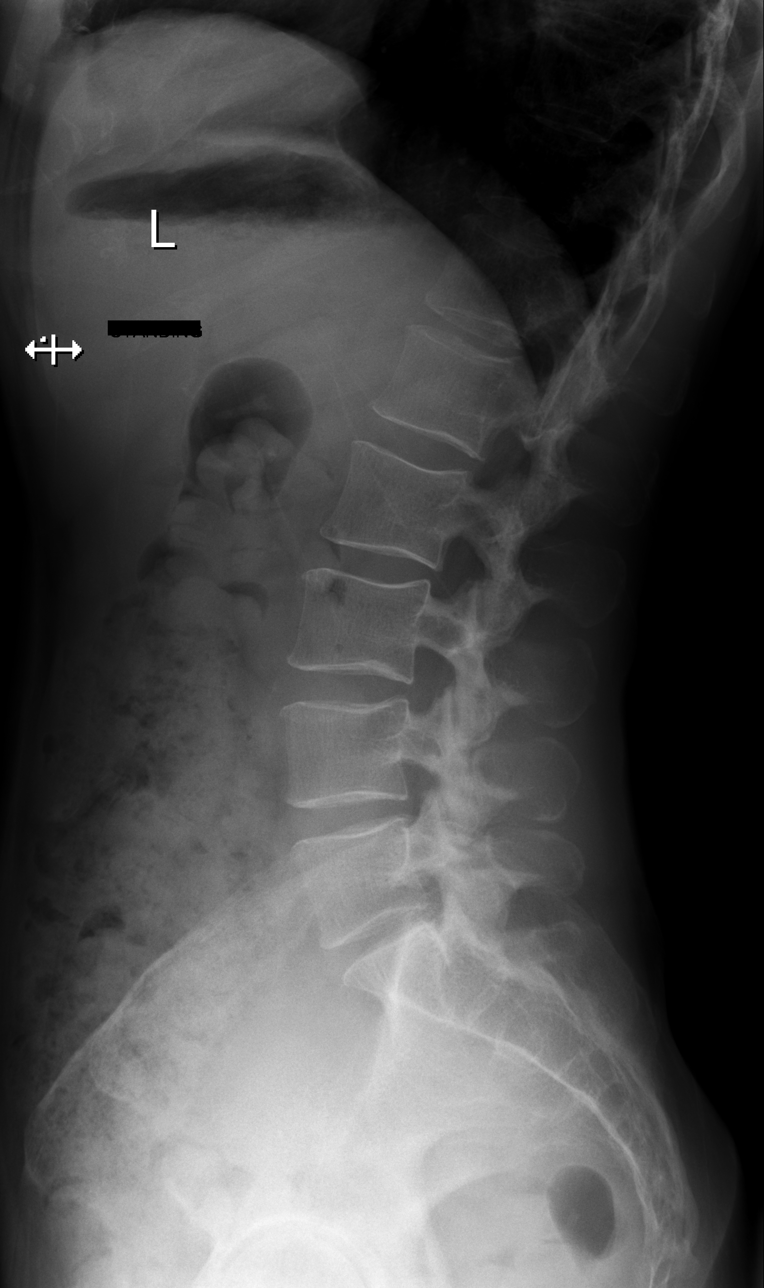

[w lumbar l-5 s-1 spot]
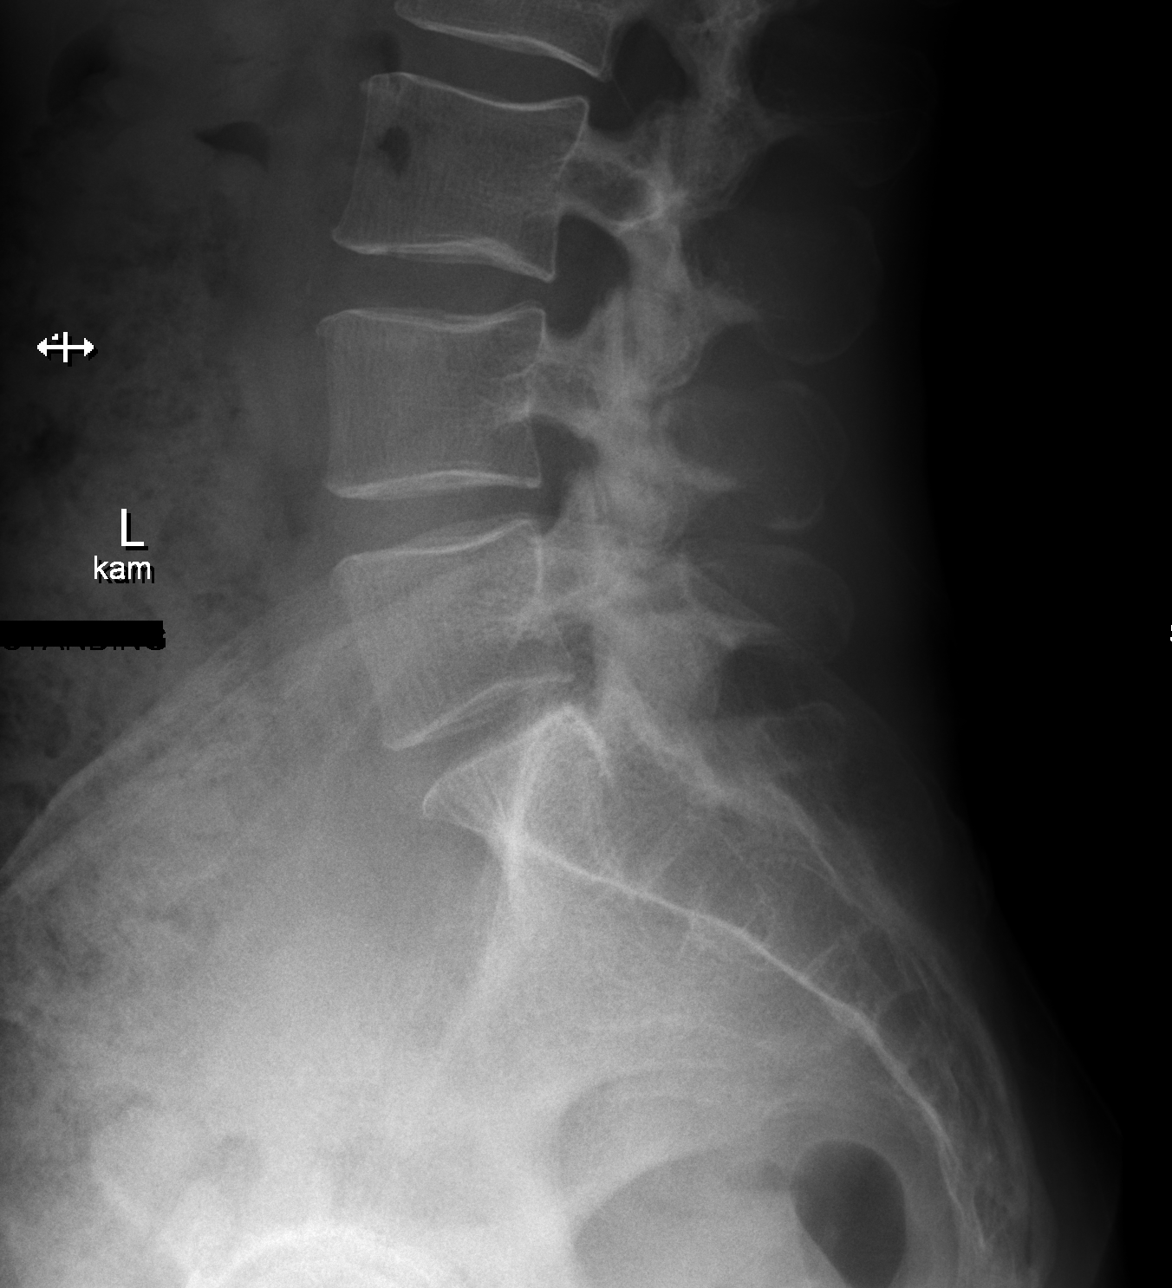

[5 of 5 positions shown; findings below may reference images not displayed]

FINDINGS: There are 5 lumbar type vertebra. Normal alignment. No listhesis.
Vertebral body heights are normal. Trace endplate spurring at L2-L3.
Minimal L5-S1 disc space narrowing. Mild facet hypertrophy at L4-L5
and L5-S1. No evidence of fracture, focal bone lesion or bone
destruction. The sacroiliac joints are congruent.
IMPRESSION: 1. Mild for age spondylosis with endplate spurring. Mild disc space
narrowing at L5-S1.
2. Mild lower lumbar facet hypertrophy.

## 2022-07-09 IMAGING — CR DG THORACIC SPINE 3V
3 series · 3 of 3 positions shown · non-contrast
Comparison: None.

CLINICAL DATA: Osteoporosis, postmenopausal. History of back pain
and degenerative disc disease. Patient reports chronic thoracic
spine pain.

EXAM:
THORACIC SPINE - 3 VIEWS

[w thoracic spine ap]
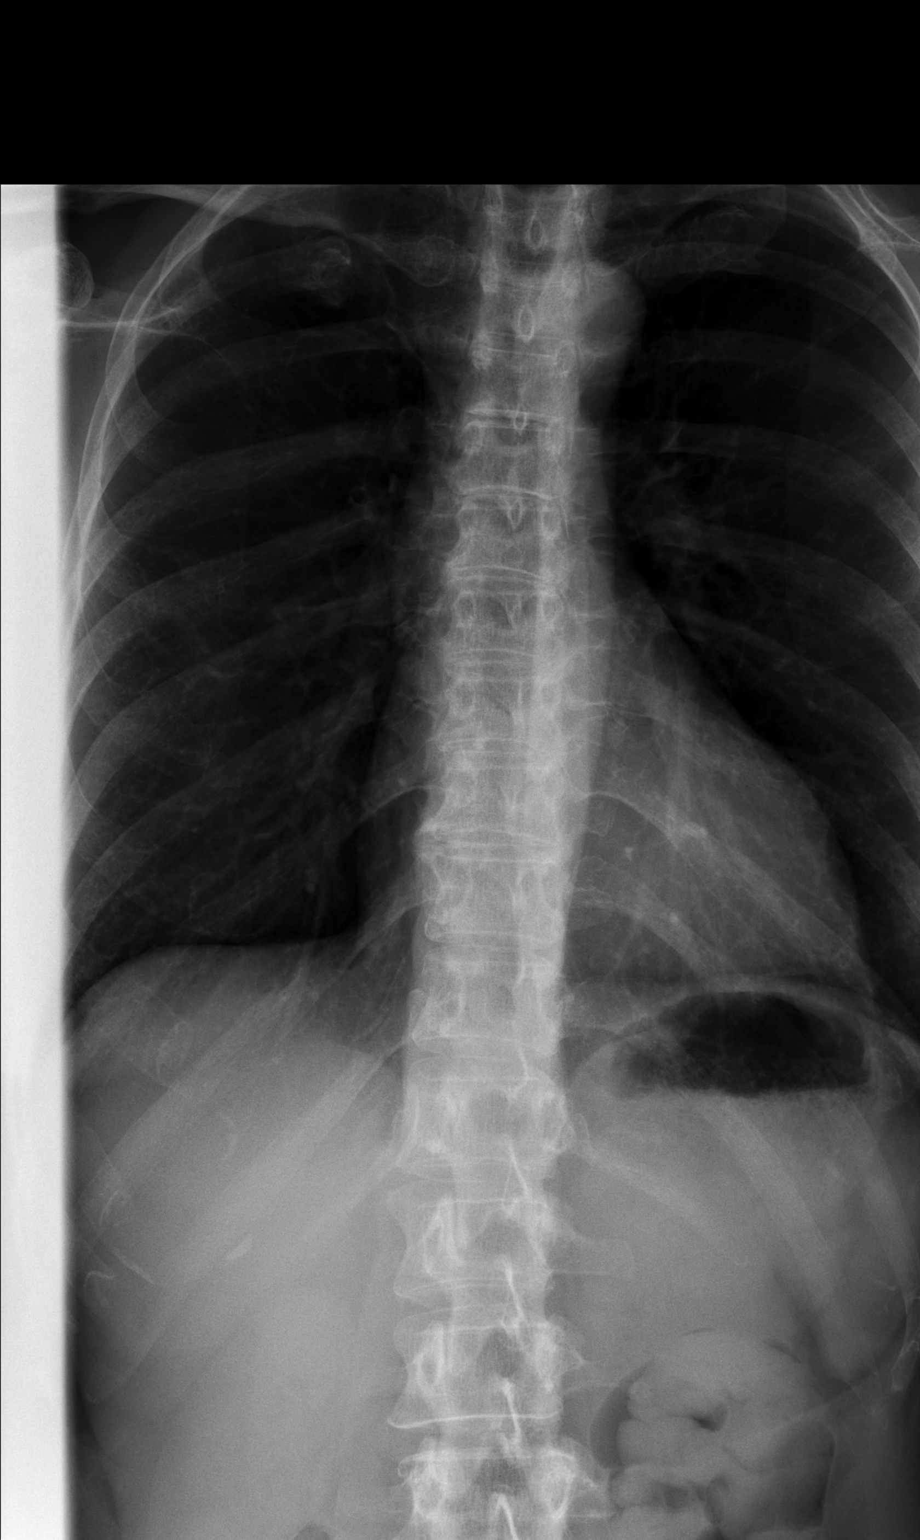

[w thoracic spine lat]
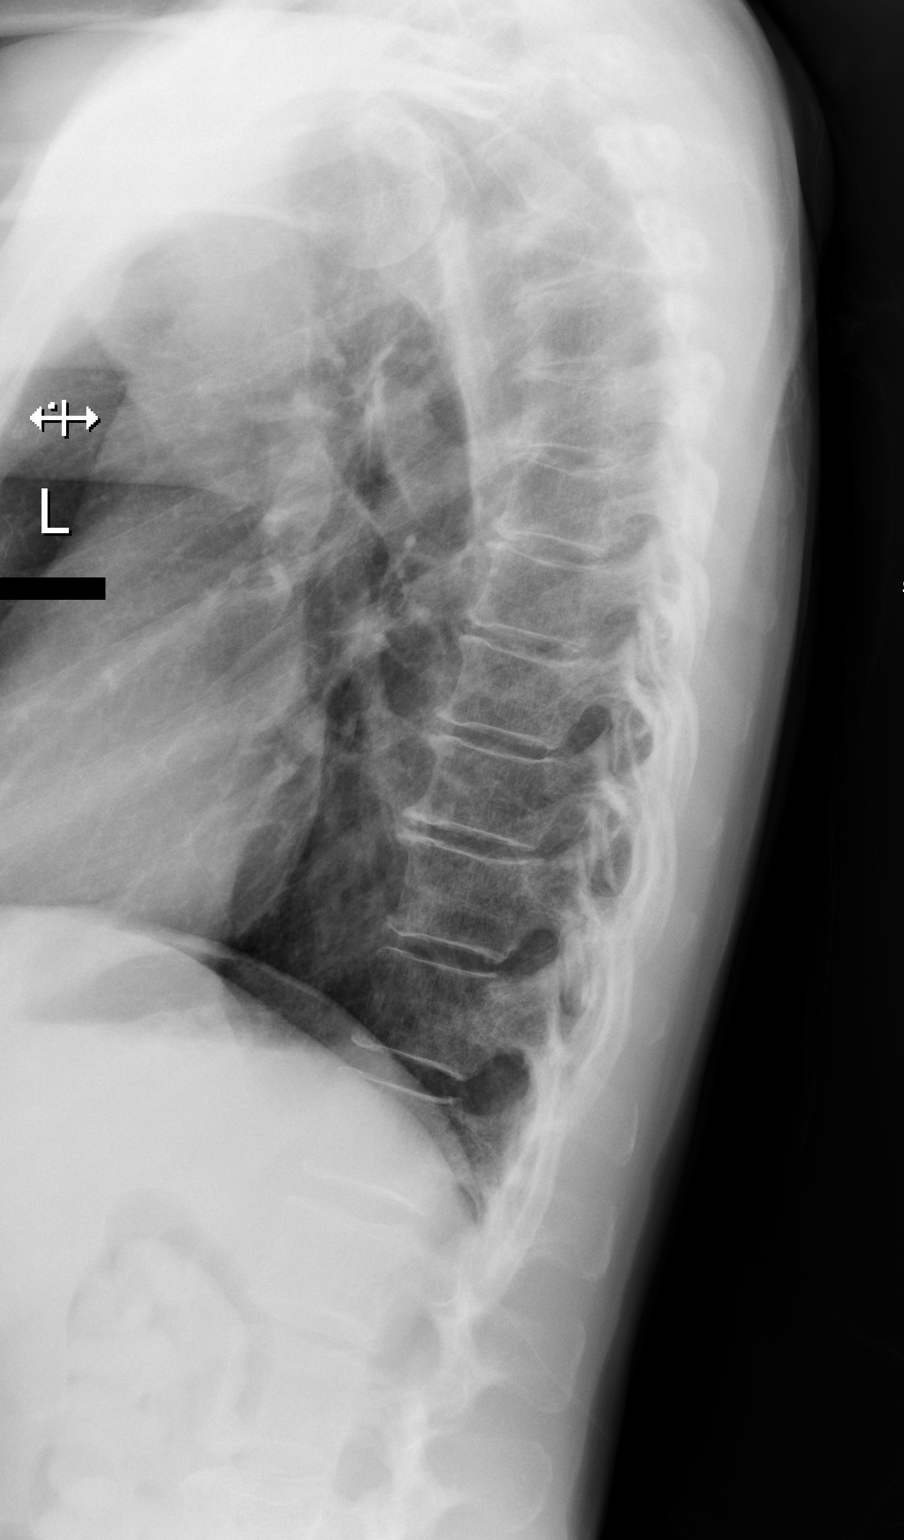

[w thoracic swimmers]
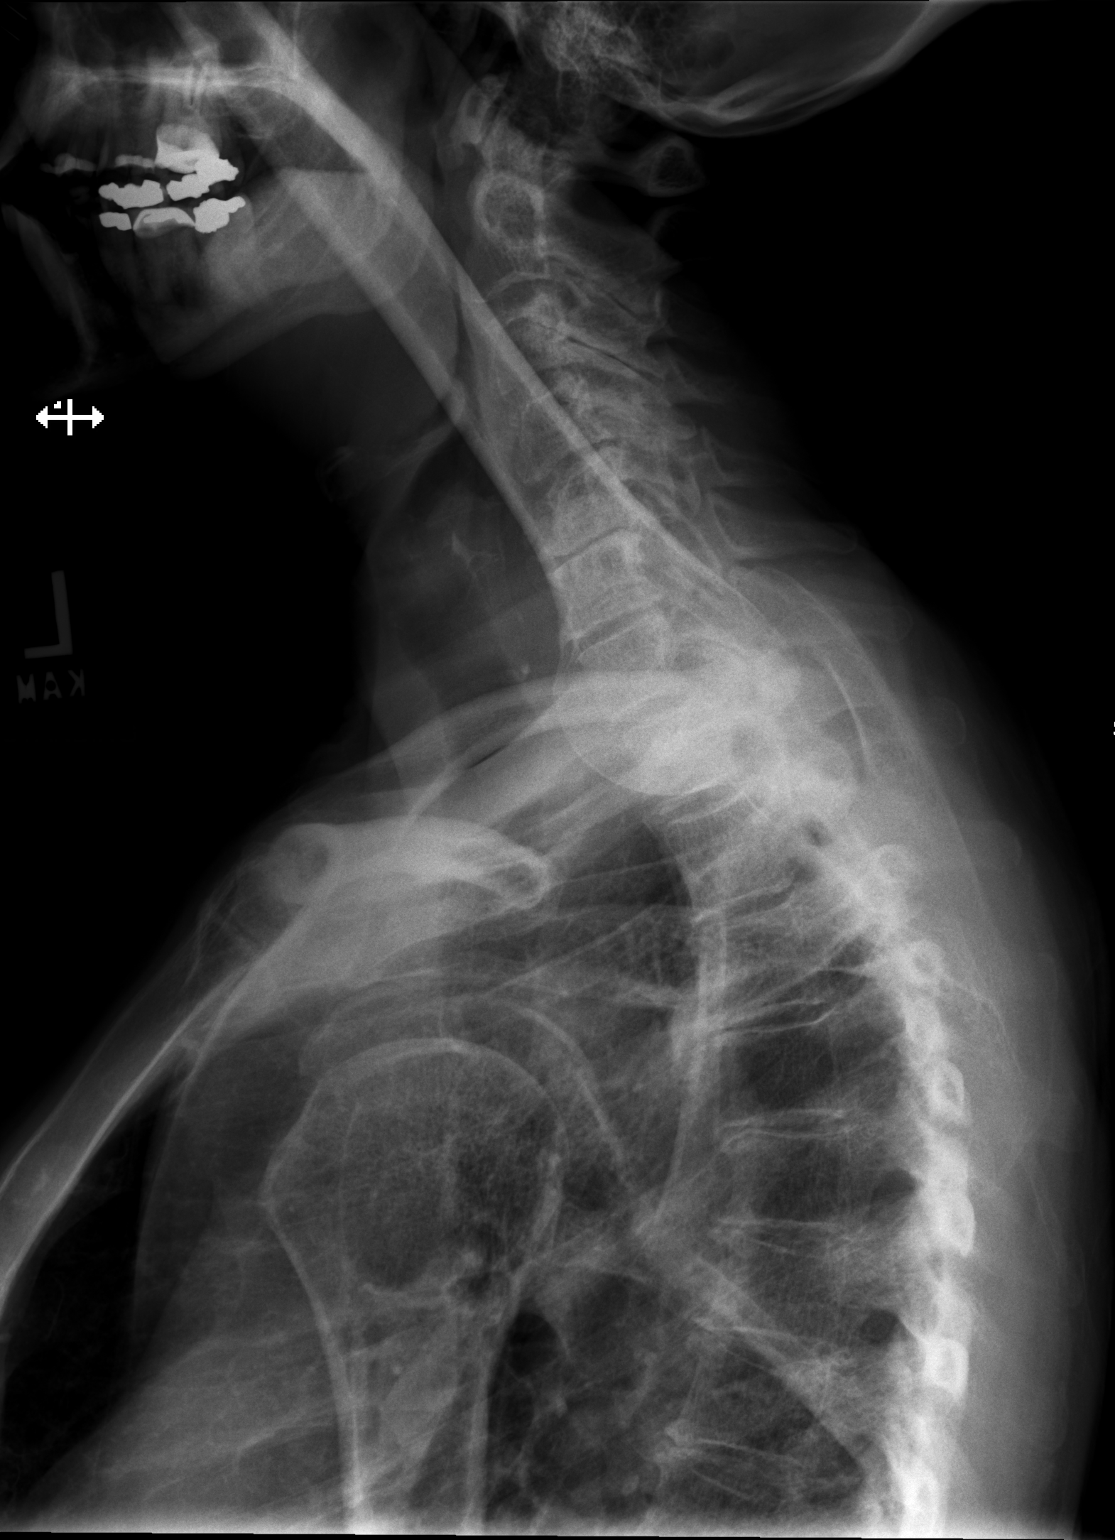

[3 of 3 positions shown; findings below may reference images not displayed]

FINDINGS: There are 13 pairs of ribs. Normal alignment. Mild multilevel
anterior endplate spurring with preservation of disc spaces.
Vertebral body heights are normal. No evidence of fracture, focal
bone lesion or bone destruction. No paravertebral soft tissue
abnormality.
IMPRESSION: 1. Mild degenerative anterior endplate spurring with preservation of
disc spaces. No acute abnormality.
2. Please note 13 pairs of ribs.

## 2022-07-11 ENCOUNTER — Ambulatory Visit (HOSPITAL_BASED_OUTPATIENT_CLINIC_OR_DEPARTMENT_OTHER): Payer: Medicare PPO | Admitting: Physical Therapy

## 2022-07-11 ENCOUNTER — Encounter (HOSPITAL_BASED_OUTPATIENT_CLINIC_OR_DEPARTMENT_OTHER): Payer: Self-pay | Admitting: Physical Therapy

## 2022-07-11 DIAGNOSIS — M5442 Lumbago with sciatica, left side: Secondary | ICD-10-CM

## 2022-07-11 DIAGNOSIS — R293 Abnormal posture: Secondary | ICD-10-CM | POA: Diagnosis not present

## 2022-07-11 DIAGNOSIS — M542 Cervicalgia: Secondary | ICD-10-CM

## 2022-07-11 NOTE — Therapy (Signed)
OUTPATIENT PHYSICAL THERAPY TREATMENT   Patient Name: Dominique Harrell MRN: [id number] DOB:Jan 12, 1956, 67 y.o., female Today's Date: 07/11/2022  END OF SESSION:  PT End of Session - 07/11/22 1143     Visit Number 4    Number of Visits 8    Date for PT Re-Evaluation 07/29/22    Authorization Type [employer] MCR    PT Start Time 1141    PT Stop Time 1229    PT Time Calculation (min) 48 min    Activity Tolerance Patient tolerated treatment well    Behavior During Therapy WFL for tasks assessed/performed             Past Medical History:  Diagnosis Date   Hyperlipidemia    Insomnia    Migraine    Osteopenia    Past Surgical History:  Procedure Laterality Date   COLONOSCOPY  08/14/2007   w/Dr.Stark   excision of liposarcoma thigh Left    removed at [place]; drop in Blood pressure per pt   OOPHORECTOMY  1999   left, dermoid cyst   Patient Active Problem List   Diagnosis Date Noted   Lumbar back pain with radiculopathy affecting right lower extremity 04/07/2015   Osteoporosis, 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   General medical examination 03/02/2011   SHOULDER IMPINGEMENT SYNDROME, LEFT 02/15/2010   Hyperlipidemia 06/08/2006   Depression 06/08/2006   MIGRAINE HEADACHE 06/08/2006    REFERRING PROVIDER:  Sheliah Hatch, MD    REFERRING DIAG: M54.2 (ICD-10-CM) - Neck pain   Rationale for Evaluation and Treatment: Rehabilitation  THERAPY DIAG:  Cervicalgia  Acute left-sided low back pain with left-sided sciatica  Abnormal posture  ONSET DATE: 5 weeks ago   SUBJECTIVE:                                                                                                                                                                                           SUBJECTIVE STATEMENT: Reached down quickly to grab cat and threw back out.   PERTINENT HISTORY:  See PMH  PAIN:  Are you having pain? Yes: NPRS scale: minimal/10 Pain location: Lt cervical spine, thoracic  spine Pain description: tight Aggravating factors: Sidebending Relieving factors: Heat  PRECAUTIONS: None  WEIGHT BEARING RESTRICTIONS: No  FALLS:  Has patient fallen in last 6 months? No   OCCUPATION: Retired, volunteers at [employer]  PLOF: Independent  PATIENT GOALS: Decrease pain  OBJECTIVE:   DIAGNOSTIC FINDINGS:  Xray 09/08/20: IMPRESSION: 1. Multilevel degenerative disc disease and facet hypertrophy throughout the cervical spine. 2. Bilateral bony neural foraminal narrowing at C5-C6, suspected right neural foraminal narrowing at C4-C5.  PATIENT SURVEYS:  EVAL: FOTO 43    SENSATION: WFL  POSTURE:  Bilateral winging scapula   Body Part #1 Cervical  PALPATION: Eval: Spasm noted along bilateral cervical spine with more tenderness to palpation of the right side 5/28- Rt ant innom rotation  Cervical ROM CERVICAL ROM:   Active ROM A/PROM (deg) eval 5/25  Flexion    Extension    Right lateral flexion 14 20  Left lateral flexion 28 24  Right rotation 48 54  Left rotation 40 50   (Blank rows = not tested)     TREATMENT:                                                                                                                               Treatment                            5/28:  Hesch self correction for Rt ant innom- supine & standing Spring mob by PT for post rotation of Rt innom Prone spring mobs to: Lt upper sacral quadrant Trigger Point Dry Needling, Manual Therapy Treatment:  Initial or subsequent education regarding Trigger Point Dry Needling: Subsequent Did patient give consent to treatment with Trigger Point Dry Needling: Yes TPDN with skilled palpation and monitoring followed by STM to the following muscles: Rt glut max/med/min lateral from Rt SIJ  Self correction at wall for sacral torsion Ktape star over bil SIJ   Treatment                            5/25:  Trigger Point Dry Needling, Manual Therapy  Treatment:  Initial or subsequent education regarding Trigger Point Dry Needling: Subsequent Did patient give consent to treatment with Trigger Point Dry Needling: Yes TPDN with skilled palpation and monitoring followed by STM to the following muscles: Lt upper trap distal and at C3-4, levator scap, mid trap over t4-8 rt ribs  Prone rib mobs PA  and inf of bil first ribs Prone Y, T Seated red tband ER Seated thoracic ext in chair   Treatment                            :  Trigger Point Dry Needling, Manual Therapy Treatment:  Initial or subsequent education regarding Trigger Point Dry Needling: Subsequent Did patient give consent to treatment with Trigger Point Dry Needling: Yes TPDN with skilled palpation and monitoring followed by STM to the following muscles: Lt upper trap, suboccipitals  STM Lt SCM, cervical traction with rotation, suboccipital relaease, Lt first rib mobilizations, prone thoracic mobs on Left side SCM stretch Hip hinge, Added 10lb row to stand with scap retraction/core/gluts   DATE: EVAL 5/13   Sheet over shoulder for first rib mob in upper trap stretch  Cervical rotation SNAG  MANUAL: STM Rt upper trap, suboccipitals  bil, bil cervical paraspinals; manual cervical traction, lateral cervical mobs paired with rotation; suboccipital release paired with chin tucks  Open book  Cat/camel/child pose   PATIENT EDUCATION:  Education details: Anatomy of condition, POC, HEP, exercise form/rationale Person educated: Patient Education method: Explanation, Demonstration, Tactile cues, Verbal cues, and Handouts Education comprehension: verbalized understanding, returned demonstration, verbal cues required, tactile cues required, and needs further education  HOME EXERCISE PROGRAM: [email or web address]   ASSESSMENT:  CLINICAL IMPRESSION: Pt presented with significant pain due to innominate rotation after reaching down quickly to grab her cat this morning. It was imperative to  decrease spasm and roatation at SIJ to avoid stiffness working up tot he cervical region. Pt was able to ambulate with proper pattenr and less guarded posture. Is supposed to go to [place] this weekend with her husband.     OBJECTIVE IMPAIRMENTS: decreased activity tolerance, decreased ROM, hypomobility, increased muscle spasms, impaired flexibility, impaired UE functional use, improper body mechanics, postural dysfunction, and pain.   ACTIVITY LIMITATIONS: carrying, lifting, sleeping, reach over head, and caring for others  PARTICIPATION LIMITATIONS: meal prep, cleaning, laundry, driving, shopping, community activity, and occupation  PERSONAL FACTORS:  Osteoporosis, history of left shoulder impingement  are also affecting patient's functional outcome.   REHAB POTENTIAL: Good  CLINICAL DECISION MAKING: Stable/uncomplicated  EVALUATION COMPLEXITY: Low   GOALS: Goals reviewed with patient? Yes  SHORT TERM GOALS: Target date: 07/08/22  Patient will be independent in mobility exercises without elevation of shoulders Baseline: Goal status: INITIAL   LONG TERM GOALS: Target date: POC date  Cervical sidebending rotation within 5 degrees left to right Baseline:  Goal status: INITIAL  2.  Resolution of left cervical spinal pain Baseline:  Goal status: INITIAL  3.  Independent with continued exercises for cervicothoracic stability and postural support Baseline:  Goal status: INITIAL  PLAN:  PT FREQUENCY: 1-2x/week  PT DURATION: 4 weeks  PLANNED INTERVENTIONS: Therapeutic exercises, Therapeutic activity, Neuromuscular re-education, Gait training, Patient/Family education, Self Care, Joint mobilization, Dry Needling, Electrical stimulation, Spinal mobilization, Cryotherapy, Moist heat, Taping, Traction, Manual therapy, and Re-evaluation.  PLAN FOR NEXT SESSION: Continue manual therapy, dry needling as needed, periscapular stability exercises   Prentice Sackrider C. Nychelle Cassata PT, DPT 07/11/22  12:32 PM   Referring diagnosis? M54.2 (ICD-10-CM) - Neck pain  Treatment diagnosis? (if different than referring diagnosis) M54.2 What was this (referring dx) caused by? []  Surgery []  Fall []  Ongoing issue [x]  Arthritis []  Other: ____________  Laterality: []  Rt []  Lt [x]  Both  Check all possible CPT codes:  *CHOOSE 10 OR LESS*    [x]  16109 (Therapeutic Exercise)  []  92507 (SLP Treatment)  []  97112 (Neuro Re-ed)   []  92526 (Swallowing Treatment)   []  97116 (Gait Training)   []  [id number] (Cognitive Training, 1st 15 minutes) [x]  97140 (Manual Therapy)   []  97130 (Cognitive Training, each add'l 15 minutes)  [x]  97164 (Re-evaluation)                              []  Other, List CPT Code ____________  [x]  97530 (Therapeutic Activities)     [x]  97535 (Self Care)   []  All codes above (97110 - 97535)  []  97012 (Mechanical Traction)  []  97014 (E-stim Unattended)  []  97032 (E-stim manual)  []  97033 (Ionto)  []  97035 (Ultrasound) [x]  97750 (Physical Performance Training) []  [id number] (Aquatic Therapy) []  97016 (Vasopneumatic Device) []  [id number] (Paraffin) []  97034 (Contrast Bath) []  [id number] (Wound Care 1st  20 sq cm) []  97598 (Wound Care each add'l 20 sq cm) []  97760 (Orthotic Fabrication, Fitting, Training Initial) []  [id number] (Prosthetic Management and Training Initial) []  [id number] (Orthotic or Prosthetic Training/ Modification Subsequent)

## 2022-07-22 ENCOUNTER — Encounter (HOSPITAL_BASED_OUTPATIENT_CLINIC_OR_DEPARTMENT_OTHER): Payer: Self-pay | Admitting: Physical Therapy

## 2022-07-22 ENCOUNTER — Ambulatory Visit (HOSPITAL_BASED_OUTPATIENT_CLINIC_OR_DEPARTMENT_OTHER): Payer: Medicare PPO | Attending: Family Medicine | Admitting: Physical Therapy

## 2022-07-22 DIAGNOSIS — R293 Abnormal posture: Secondary | ICD-10-CM | POA: Diagnosis not present

## 2022-07-22 DIAGNOSIS — M542 Cervicalgia: Secondary | ICD-10-CM | POA: Diagnosis not present

## 2022-07-22 DIAGNOSIS — M5442 Lumbago with sciatica, left side: Secondary | ICD-10-CM | POA: Insufficient documentation

## 2022-07-22 NOTE — Therapy (Signed)
OUTPATIENT PHYSICAL THERAPY TREATMENT   Patient Name: Dominique Harrell MRN: [id number] DOB:12/25/1955, 67 y.o., female Today's Date: 07/22/2022  END OF SESSION:  PT End of Session - 07/22/22 1033     Visit Number 5    Number of Visits 8    Date for PT Re-Evaluation 08/12/22    Authorization Type [employer] MCR    PT Start Time 1030    PT Stop Time 1115    PT Time Calculation (min) 45 min    Activity Tolerance Patient tolerated treatment well    Behavior During Therapy WFL for tasks assessed/performed              Past Medical History:  Diagnosis Date   Hyperlipidemia    Insomnia    Migraine    Osteopenia    Past Surgical History:  Procedure Laterality Date   COLONOSCOPY  08/14/2007   w/Dr.Stark   excision of liposarcoma thigh Left    removed at [facility]; drop in Blood pressure per pt   OOPHORECTOMY  1999   left, dermoid cyst   Patient Active Problem List   Diagnosis Date Noted   Lumbar back pain with radiculopathy affecting right lower extremity 04/07/2015   Osteoporosis, 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   General medical examination 03/02/2011   SHOULDER IMPINGEMENT SYNDROME, LEFT 02/15/2010   Hyperlipidemia 06/08/2006   Depression 06/08/2006   MIGRAINE HEADACHE 06/08/2006    REFERRING PROVIDER:  Sheliah Hatch, MD    REFERRING DIAG: M54.2 (ICD-10-CM) - Neck pain   Rationale for Evaluation and Treatment: Rehabilitation  THERAPY DIAG:  Cervicalgia  Acute left-sided low back pain with left-sided sciatica  Abnormal posture  ONSET DATE: 5 weeks ago   SUBJECTIVE:                                                                                                                                                                                           SUBJECTIVE STATEMENT: Low back went through its typical pattern of hard to walk and drive for a few days. I went to [place] and didn't feel the spasm until the end of the day. Sitting is worse than walking around.  Neck is tight prob after babying back.   PERTINENT HISTORY:  See PMH  PAIN:  Are you having pain? Yes: NPRS scale: minimal/10 Pain location: Lt cervical spine, thoracic spine Pain description: tight Aggravating factors: Sidebending Relieving factors: Heat  PRECAUTIONS: None  WEIGHT BEARING RESTRICTIONS: No  FALLS:  Has patient fallen in last 6 months? No   OCCUPATION: Retired, volunteers at [employer]  PLOF: Independent  PATIENT GOALS: Decrease pain  OBJECTIVE:  DIAGNOSTIC FINDINGS:  Xray 09/08/20: IMPRESSION: 1. Multilevel degenerative disc disease and facet hypertrophy throughout the cervical spine. 2. Bilateral bony neural foraminal narrowing at C5-C6, suspected right neural foraminal narrowing at C4-C5.  PATIENT SURVEYS:  EVAL: FOTO 43    SENSATION: WFL  POSTURE:  Bilateral winging scapula   Body Part #1 Cervical  PALPATION: Eval: Spasm noted along bilateral cervical spine with more tenderness to palpation of the right side 5/28- Rt ant innom rotation  Cervical ROM CERVICAL ROM:   Active ROM A/PROM (deg) eval 5/25 6/8 Pre/post tx  Flexion     Extension     Right lateral flexion 14 20   Left lateral flexion 28 24   Right rotation 48 54 48/60  Left rotation 40 50 58/60   (Blank rows = not tested)     TREATMENT:                                                                                                                               Treatment                            6/8:  Trigger Point Dry Needling, Manual Therapy Treatment:  Initial or subsequent education regarding Trigger Point Dry Needling: Subsequent Did patient give consent to treatment with Trigger Point Dry Needling: Yes TPDN with skilled palpation and monitoring followed by STM to the following muscles: bil upper trap, bil levator scap STM Rt rhomoids/middle trap  Cervical traction, +suboccipital release, bil first rib depression mobs with cervical  sidebend, Lt to Rt cervical mobs Supine chest stretch 3 breaths holds Supine horiz adduction stretch Supine STM Lt scalenes Chin tuck paired with cervical traction   Treatment                            5/28:  Hesch self correction for Rt ant innom- supine & standing Spring mob by PT for post rotation of Rt innom Prone spring mobs to: Lt upper sacral quadrant Trigger Point Dry Needling, Manual Therapy Treatment:  Initial or subsequent education regarding Trigger Point Dry Needling: Subsequent Did patient give consent to treatment with Trigger Point Dry Needling: Yes TPDN with skilled palpation and monitoring followed by STM to the following muscles: Rt glut max/med/min lateral from Rt SIJ  Self correction at wall for sacral torsion Ktape star over bil SIJ   Treatment                            5/25:  Trigger Point Dry Needling, Manual Therapy Treatment:  Initial or subsequent education regarding Trigger Point Dry Needling: Subsequent Did patient give consent to treatment with Trigger Point Dry Needling: Yes TPDN with skilled palpation and monitoring followed by STM to the following muscles: Lt upper trap distal and at C3-4, levator scap, mid  trap over t4-8 rt ribs  Prone rib mobs PA  and inf of bil first ribs Prone Y, T Seated red tband ER Seated thoracic ext in chair      PATIENT EDUCATION:  Education details: Anatomy of condition, POC, HEP, exercise form/rationale Person educated: Patient Education method: Explanation, Demonstration, Tactile cues, Verbal cues, and Handouts Education comprehension: verbalized understanding, returned demonstration, verbal cues required, tactile cues required, and needs further education  HOME EXERCISE PROGRAM: [id number]   ASSESSMENT:  CLINICAL IMPRESSION: arrived with cervical shift to the left and thoracic dextroscoliotic curve & reduced with treatment today- pt verbalized improved comfort in ROM. Tightness noted through lower back but  is moving with more freedom- not addressed with DN today. Extending POC for scheduling.    OBJECTIVE IMPAIRMENTS: decreased activity tolerance, decreased ROM, hypomobility, increased muscle spasms, impaired flexibility, impaired UE functional use, improper body mechanics, postural dysfunction, and pain.   ACTIVITY LIMITATIONS: carrying, lifting, sleeping, reach over head, and caring for others  PARTICIPATION LIMITATIONS: meal prep, cleaning, laundry, driving, shopping, community activity, and occupation  PERSONAL FACTORS:  Osteoporosis, history of left shoulder impingement  are also affecting patient's functional outcome.   REHAB POTENTIAL: Good  CLINICAL DECISION MAKING: Stable/uncomplicated  EVALUATION COMPLEXITY: Low   GOALS: Goals reviewed with patient? Yes  SHORT TERM GOALS: Target date: 07/08/22  Patient will be independent in mobility exercises without elevation of shoulders Baseline: Goal status:achieved   LONG TERM GOALS: Target date: POC date  Cervical sidebending rotation within 5 degrees left to right Baseline:  Goal status: INITIAL  2.  Resolution of left cervical spinal pain Baseline:  Goal status: INITIAL  3.  Independent with continued exercises for cervicothoracic stability and postural support Baseline:  Goal status: INITIAL  PLAN:  PT FREQUENCY: 1-2x/week  PT DURATION: 4 weeks  PLANNED INTERVENTIONS: Therapeutic exercises, Therapeutic activity, Neuromuscular re-education, Gait training, Patient/Family education, Self Care, Joint mobilization, Dry Needling, Electrical stimulation, Spinal mobilization, Cryotherapy, Moist heat, Taping, Traction, Manual therapy, and Re-evaluation.  PLAN FOR NEXT SESSION: Continue manual therapy, dry needling as needed, periscapular stability exercises   Dondrea Clendenin C. Aiyanna Awtrey PT, DPT 07/22/22 11:20 AM   Referring diagnosis? M54.2 (ICD-10-CM) - Neck pain  Treatment diagnosis? (if different than referring diagnosis)  M54.2 What was this (referring dx) caused by? []  Surgery []  Fall []  Ongoing issue [x]  Arthritis []  Other: ____________  Laterality: []  Rt []  Lt [x]  Both  Check all possible CPT codes:  *CHOOSE 10 OR LESS*    [x]  16109 (Therapeutic Exercise)  []  92507 (SLP Treatment)  []  97112 (Neuro Re-ed)   []  92526 (Swallowing Treatment)   []  97116 (Gait Training)   []  [id number] (Cognitive Training, 1st 15 minutes) [x]  97140 (Manual Therapy)   []  97130 (Cognitive Training, each add'l 15 minutes)  [x]  97164 (Re-evaluation)                              []  Other, List CPT Code ____________  [x]  97530 (Therapeutic Activities)     [x]  97535 (Self Care)   []  All codes above (97110 - 97535)  []  97012 (Mechanical Traction)  []  97014 (E-stim Unattended)  []  97032 (E-stim manual)  []  97033 (Ionto)  []  97035 (Ultrasound) [x]  97750 (Physical Performance Training) []  [id number] (Aquatic Therapy) []  97016 (Vasopneumatic Device) []  [id number] (Paraffin) []  97034 (Contrast Bath) []  97597 (Wound Care 1st 20 sq cm) []  97598 (Wound Care each add'l 20 sq cm) []  97760 (  Orthotic Fabrication, Fitting, Training Initial) []  [id number] (Prosthetic Management and Training Initial) []  [id number] (Orthotic or Prosthetic Training/ Modification Subsequent)

## 2022-07-28 NOTE — Therapy (Signed)
OUTPATIENT PHYSICAL THERAPY TREATMENT   Patient Name: Dominique Harrell MRN: [id number] DOB:Jun 15, 1955, 67 y.o., female Today's Date: 07/22/2022  END OF SESSION:  PT End of Session - 07/22/22 1033     Visit Number 5    Number of Visits 8    Date for PT Re-Evaluation 08/12/22    Authorization Type [employer] M[employer]    PT Start Time 1030    PT Stop Time 1115    PT Time Calculation (min) 45 min    Activity Tolerance Patient tolerated treatment well    Behavior During Therapy WFL for tasks assessed/performed              Past Medical History:  Diagnosis Date   Hyperlipidemia    Insomnia    Migraine    Osteopenia    Past Surgical History:  Procedure Laterality Date   COLONOSCOPY  08/14/2007   w/Dr.Stark   excision of liposarcoma thigh Left    removed at [facility]; drop in Blood pressure per pt   OOPHORECTOMY  1999   left, dermoid cyst   Patient Active Problem List   Diagnosis Date Noted   Lumbar back pain with radiculopathy affecting right lower extremity 04/07/2015   Osteoporosis, 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   General medical examination 03/02/2011   SHOULDER IMPINGEMENT SYNDROME, LEFT 02/15/2010   Hyperlipidemia 06/08/2006   Depression 06/08/2006   MIGRAINE HEADACHE 06/08/2006    REFERRING PROVIDER:  Sheliah Hatch, MD    REFERRING DIAG: M54.2 (ICD-10-CM) - Neck pain   Rationale for Evaluation and Treatment: Rehabilitation  THERAPY DIAG:  Cervicalgia  Acute left-sided low back pain with left-sided sciatica  Abnormal posture  ONSET DATE: 5 weeks ago   SUBJECTIVE:                                                                                                                                                                                           SUBJECTIVE STATEMENT: ***  PERTINENT HISTORY:  See PMH  PAIN:  Are you having pain? Yes: NPRS scale: minimal/10 Pain location: Lt cervical spine, thoracic spine Pain description: tight Aggravating  factors: Sidebending Relieving factors: Heat  PRECAUTIONS: None  WEIGHT BEARING RESTRICTIONS: No  FALLS:  Has patient fallen in last 6 months? No   OCCUPATION: Retired, volunteers at [employer]  PLOF: Independent  PATIENT GOALS: Decrease pain  OBJECTIVE:   DIAGNOSTIC FINDINGS:  Xray 09/08/20: IMPRESSION: 1. Multilevel degenerative disc disease and facet hypertrophy throughout the cervical spine. 2. Bilateral bony neural foraminal narrowing at C5-C6, suspected right neural foraminal narrowing at C4-C5.  PATIENT SURVEYS:  EVAL: FOTO 43    SENSATION: WFL  POSTURE:  Bilateral winging scapula   Body Part #1 Cervical  PALPATION: Eval: Spasm noted along bilateral cervical spine with more tenderness to palpation of the right side 5/28- Rt ant innom rotation  Cervical ROM CERVICAL ROM:   Active ROM A/PROM (deg) eval 5/25 6/8 Pre/post tx  Flexion     Extension     Right lateral flexion 14 20   Left lateral flexion 28 24   Right rotation 48 54 48/60  Left rotation 40 50 58/60   (Blank rows = not tested)     TREATMENT:                                                                                                                               Treatment                            ***:  ***   Treatment                            6/8:  Trigger Point Dry Needling, Manual Therapy Treatment:  Initial or subsequent education regarding Trigger Point Dry Needling: Subsequent Did patient give consent to treatment with Trigger Point Dry Needling: Yes TPDN with skilled palpation and monitoring followed by STM to the following muscles: bil upper trap, bil levator scap STM Rt rhomoids/middle trap  Cervical traction, +suboccipital release, bil first rib depression mobs with cervical sidebend, Lt to Rt cervical mobs Supine chest stretch 3 breaths holds Supine horiz adduction stretch Supine STM Lt scalenes Chin tuck paired with cervical  traction   Treatment                            5/28:  Hesch self correction for Rt ant innom- supine & standing Spring mob by PT for post rotation of Rt innom Prone spring mobs to: Lt upper sacral quadrant Trigger Point Dry Needling, Manual Therapy Treatment:  Initial or subsequent education regarding Trigger Point Dry Needling: Subsequent Did patient give consent to treatment with Trigger Point Dry Needling: Yes TPDN with skilled palpation and monitoring followed by STM to the following muscles: Rt glut max/med/min lateral from Rt SIJ  Self correction at wall for sacral torsion Ktape star over bil SIJ     PATIENT EDUCATION:  Education details: Anatomy of condition, POC, HEP, exercise form/rationale Person educated: Patient Education method: Explanation, Demonstration, Tactile cues, Verbal cues, and Handouts Education comprehension: verbalized understanding, returned demonstration, verbal cues required, tactile cues required, and needs further education  HOME EXERCISE PROGRAM: [id number]   ASSESSMENT:  CLINICAL IMPRESSION:  ***  OBJECTIVE IMPAIRMENTS: decreased activity tolerance, decreased ROM, hypomobility, increased muscle spasms, impaired flexibility, impaired UE functional use, improper body mechanics, postural dysfunction, and pain.   ACTIVITY LIMITATIONS: carrying, lifting, sleeping, reach over head, and caring for others  PARTICIPATION LIMITATIONS: meal prep, cleaning, laundry, driving, shopping, community activity, and occupation  PERSONAL FACTORS:  Osteoporosis, history of left shoulder impingement  are also affecting patient's functional outcome.   REHAB POTENTIAL: Good  CLINICAL DECISION MAKING: Stable/uncomplicated  EVALUATION COMPLEXITY: Low   GOALS: Goals reviewed with patient? Yes  SHORT TERM GOALS: Target date: 07/08/22  Patient will be independent in mobility exercises without elevation of shoulders Baseline: Goal status:achieved   LONG TERM  GOALS: Target date: POC date  Cervical sidebending rotation within 5 degrees left to right Baseline:  Goal status: INITIAL  2.  Resolution of left cervical spinal pain Baseline:  Goal status: INITIAL  3.  Independent with continued exercises for cervicothoracic stability and postural support Baseline:  Goal status: INITIAL  PLAN:  PT FREQUENCY: 1-2x/week  PT DURATION: 4 weeks  PLANNED INTERVENTIONS: Therapeutic exercises, Therapeutic activity, Neuromuscular re-education, Gait training, Patient/Family education, Self Care, Joint mobilization, Dry Needling, Electrical stimulation, Spinal mobilization, Cryotherapy, Moist heat, Taping, Traction, Manual therapy, and Re-evaluation.  PLAN FOR NEXT SESSION: Continue manual therapy, dry needling as needed, periscapular stability exercises   Phoenix Riesen C. Latish Toutant PT, DPT 07/22/22 11:20 AM   Referring diagnosis? M54.2 (ICD-10-CM) - Neck pain  Treatment diagnosis? (if different than referring diagnosis) M54.2 What was this (referring dx) caused by? []  Surgery []  Fall []  Ongoing issue [x]  Arthritis []  Other: ____________  Laterality: []  Rt []  Lt [x]  Both  Check all possible CPT codes:  *CHOOSE 10 OR LESS*    [x]  42706 (Therapeutic Exercise)  []  92507 (SLP Treatment)  []  97112 (Neuro Re-ed)   []  92526 (Swallowing Treatment)   []  97116 (Gait Training)   []  [id number] (Cognitive Training, 1st 15 minutes) [x]  97140 (Manual Therapy)   []  97130 (Cognitive Training, each add'l 15 minutes)  [x]  97164 (Re-evaluation)                              []  Other, List CPT Code ____________  [x]  97530 (Therapeutic Activities)     [x]  97535 (Self Care)   []  All codes above (97110 - 97535)  []  97012 (Mechanical Traction)  []  97014 (E-stim Unattended)  []  97032 (E-stim manual)  []  97033 (Ionto)  []  97035 (Ultrasound) [x]  97750 (Physical Performance Training) []  [id number] (Aquatic Therapy) []  97016 (Vasopneumatic Device) []  [id number] (Paraffin) []  97034  (Contrast Bath) []  97597 (Wound Care 1st 20 sq cm) []  97598 (Wound Care each add'l 20 sq cm) []  97760 (Orthotic Fabrication, Fitting, Training Initial) []  [id number] (Prosthetic Management and Training Initial) []  [id number] (Orthotic or Prosthetic Training/ Modification Subsequent)

## 2022-07-29 ENCOUNTER — Encounter (HOSPITAL_BASED_OUTPATIENT_CLINIC_OR_DEPARTMENT_OTHER): Payer: Self-pay | Admitting: Physical Therapy

## 2022-07-29 ENCOUNTER — Ambulatory Visit (HOSPITAL_BASED_OUTPATIENT_CLINIC_OR_DEPARTMENT_OTHER): Payer: Medicare PPO | Admitting: Physical Therapy

## 2022-07-29 DIAGNOSIS — M542 Cervicalgia: Secondary | ICD-10-CM | POA: Diagnosis not present

## 2022-07-29 DIAGNOSIS — M5442 Lumbago with sciatica, left side: Secondary | ICD-10-CM | POA: Diagnosis not present

## 2022-07-29 DIAGNOSIS — R293 Abnormal posture: Secondary | ICD-10-CM | POA: Diagnosis not present

## 2022-08-05 ENCOUNTER — Ambulatory Visit (HOSPITAL_BASED_OUTPATIENT_CLINIC_OR_DEPARTMENT_OTHER): Payer: Medicare PPO | Admitting: Physical Therapy

## 2022-08-05 ENCOUNTER — Encounter (HOSPITAL_BASED_OUTPATIENT_CLINIC_OR_DEPARTMENT_OTHER): Payer: Self-pay | Admitting: Physical Therapy

## 2022-08-05 DIAGNOSIS — M5442 Lumbago with sciatica, left side: Secondary | ICD-10-CM | POA: Diagnosis not present

## 2022-08-05 DIAGNOSIS — R293 Abnormal posture: Secondary | ICD-10-CM | POA: Diagnosis not present

## 2022-08-05 DIAGNOSIS — M542 Cervicalgia: Secondary | ICD-10-CM

## 2022-08-05 NOTE — Therapy (Signed)
OUTPATIENT PHYSICAL THERAPY TREATMENT   Patient Name: Dominique Harrell MRN: [id number] DOB:1955/05/01, 67 y.o., female Today's Date: 08/05/2022  END OF SESSION:  PT End of Session - 08/05/22 0921     Visit Number 7    Number of Visits 8    Date for PT Re-Evaluation 08/12/22    Authorization Type [employer] MCR    PT Start Time 0920    PT Stop Time 0957    PT Time Calculation (min) 37 min    Activity Tolerance Patient tolerated treatment well    Behavior During Therapy [facility] for tasks assessed/performed              Past Medical History:  Diagnosis Date   Hyperlipidemia    Insomnia    Migraine    Osteopenia    Past Surgical History:  Procedure Laterality Date   COLONOSCOPY  08/14/2007   w/Dr.Stark   excision of liposarcoma thigh Left    removed at [facility]; drop in Blood pressure per pt   OOPHORECTOMY  1999   left, dermoid cyst   Patient Active Problem List   Diagnosis Date Noted   Lumbar back pain with radiculopathy affecting right lower extremity 04/07/2015   Osteoporosis, 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   General medical examination 03/02/2011   SHOULDER IMPINGEMENT SYNDROME, LEFT 02/15/2010   Hyperlipidemia 06/08/2006   Depression 06/08/2006   MIGRAINE HEADACHE 06/08/2006    REFERRING PROVIDER:  Sheliah Hatch, MD    REFERRING DIAG: M54.2 (ICD-10-CM) - Neck pain   Rationale for Evaluation and Treatment: Rehabilitation  THERAPY DIAG:  Cervicalgia  ONSET DATE: 5 weeks ago   SUBJECTIVE:                                                                                                                                                                                           SUBJECTIVE STATEMENT: My low back is really achey. Neck is feeling really good. Drove to [place] and driving is my thing that is bad for my back.   PERTINENT HISTORY:  See PMH  PAIN:  Are you having pain? Yes: NPRS scale: minimal/10 Pain location: Lt cervical spine, thoracic  spine Pain description: tight Aggravating factors: Sidebending Relieving factors: Heat  PRECAUTIONS: None  WEIGHT BEARING RESTRICTIONS: No  FALLS:  Has patient fallen in last 6 months? No   OCCUPATION: Retired, volunteers at [employer]  PLOF: Independent  PATIENT GOALS: Decrease pain  OBJECTIVE:   DIAGNOSTIC FINDINGS:  Xray 09/08/20: IMPRESSION: 1. Multilevel degenerative disc disease and facet hypertrophy throughout the cervical spine. 2. Bilateral bony neural foraminal narrowing at C5-C6, suspected right neural foraminal narrowing at  C4-C5.  PATIENT SURVEYS:  EVAL: FOTO 43 6/15: FOTO 63    SENSATION: WFL  POSTURE:  Bilateral winging scapula   Body Part #1 Cervical  PALPATION: Eval: Spasm noted along bilateral cervical spine with more tenderness to palpation of the right side 5/28- Rt ant innom rotation 6/15- neutral innom alignment. Limited expansion thorough Lt post rib cage upon inhale in prone  Cervical ROM CERVICAL ROM:   Active ROM A/PROM (deg) eval 5/25 6/8 Pre/post tx 6/22  Flexion      Extension      Right lateral flexion 14 20    Left lateral flexion 28 24    Right rotation 48 54 48/60 50  Left rotation 40 50 58/60 54   (Blank rows = not tested)     TREATMENT:                                                                                                                               Treatment                            6/22:  Prone rib mobs bilaterally, Rt upper trap STM with scapular mobilization Qped protraction/retraction Hooklying backstroke Mod dead bug with rib cage depression/core engagement: single arm/leg, 9090 from wall with ball bw knees   Treatment                            6/15:  Trigger Point Dry Needling, Manual Therapy Treatment:  Initial or subsequent education regarding Trigger Point Dry Needling: Subsequent Did patient give consent to treatment with Trigger Point Dry Needling: Yes TPDN with  skilled palpation and monitoring followed by STM to the following muscles: Lt upper trap, Lt suboccipitals  Supine rib mobility, STM Lt thoracic paraspinals Scap retraction Door pec stretch Hooklying deep breath with hands on forehead- ab set in exhale Hooklying cervical small Rt side bend-Lt rotation-retraction   Treatment                            6/8:  Trigger Point Dry Needling, Manual Therapy Treatment:  Initial or subsequent education regarding Trigger Point Dry Needling: Subsequent Did patient give consent to treatment with Trigger Point Dry Needling: Yes TPDN with skilled palpation and monitoring followed by STM to the following muscles: bil upper trap, bil levator scap STM Rt rhomoids/middle trap  Cervical traction, +suboccipital release, bil first rib depression mobs with cervical sidebend, Lt to Rt cervical mobs Supine chest stretch 3 breaths holds Supine horiz adduction stretch Supine STM Lt scalenes Chin tuck paired with cervical traction    PATIENT EDUCATION:  Education details: Teacher, music of condition, POC, HEP, exercise form/rationale Person educated: Patient Education method: Programmer, multimedia, Demonstration, Tactile cues, Verbal cues, and Handouts Education comprehension: verbalized understanding, returned demonstration, verbal cues required, tactile cues required,  and needs further education  HOME EXERCISE PROGRAM: [id number]   ASSESSMENT:  CLINICAL IMPRESSION:  Elevated post rib on left side in prone reduced. Tightness across coronal aspect of gluts. Pain at Rt sIJ with start of 90/90 march but able to resolve with contraction of core- altered tactile cue.   OBJECTIVE IMPAIRMENTS: decreased activity tolerance, decreased ROM, hypomobility, increased muscle spasms, impaired flexibility, impaired UE functional use, improper body mechanics, postural dysfunction, and pain.   ACTIVITY LIMITATIONS: carrying, lifting, sleeping, reach over head, and caring for  others  PARTICIPATION LIMITATIONS: meal prep, cleaning, laundry, driving, shopping, community activity, and occupation  PERSONAL FACTORS:  Osteoporosis, history of left shoulder impingement  are also affecting patient's functional outcome.   REHAB POTENTIAL: Good  CLINICAL DECISION MAKING: Stable/uncomplicated  EVALUATION COMPLEXITY: Low   GOALS: Goals reviewed with patient? Yes  SHORT TERM GOALS: Target date: 07/08/22  Patient will be independent in mobility exercises without elevation of shoulders Baseline: Goal status:achieved   LONG TERM GOALS: Target date: POC date  Cervical sidebending rotation within 5 degrees left to right Baseline:  Goal status: INITIAL  2.  Resolution of left cervical spinal pain Baseline:  Goal status: INITIAL  3.  Independent with continued exercises for cervicothoracic stability and postural support Baseline:  Goal status: INITIAL  PLAN:  PT FREQUENCY: 1-2x/week  PT DURATION: 4 weeks  PLANNED INTERVENTIONS: Therapeutic exercises, Therapeutic activity, Neuromuscular re-education, Gait training, Patient/Family education, Self Care, Joint mobilization, Dry Needling, Electrical stimulation, Spinal mobilization, Cryotherapy, Moist heat, Taping, Traction, Manual therapy, and Re-evaluation.  PLAN FOR NEXT SESSION: Continue manual therapy, dry needling as needed, periscapular stability exercises   Cote Mayabb C. Yama Nielson PT, DPT 08/05/22 9:59 AM   Referring diagnosis? M54.2 (ICD-10-CM) - Neck pain  Treatment diagnosis? (if different than referring diagnosis) M54.2 What was this (referring dx) caused by? []  Surgery []  Fall []  Ongoing issue [x]  Arthritis []  Other: ____________  Laterality: []  Rt []  Lt [x]  Both  Check all possible CPT codes:  *CHOOSE 10 OR LESS*    [x]  97110 (Therapeutic Exercise)  []  92507 (SLP Treatment)  []  97112 (Neuro Re-ed)   []  92526 (Swallowing Treatment)   []  97116 (Gait Training)   []  [id number] (Cognitive  Training, 1st 15 minutes) [x]  97140 (Manual Therapy)   []  97130 (Cognitive Training, each add'l 15 minutes)  [x]  97164 (Re-evaluation)                              []  Other, List CPT Code ____________  [x]  97530 (Therapeutic Activities)     [x]  97535 (Self Care)   []  All codes above (97110 - 97535)  []  97012 (Mechanical Traction)  []  97014 (E-stim Unattended)  []  97032 (E-stim manual)  []  97033 (Ionto)  []  97035 (Ultrasound) [x]  65784 (Physical Performance Training) []  [id number] (Aquatic Therapy) []  97016 (Vasopneumatic Device) []  97018 (Paraffin) []  97034 (Contrast Bath) []  97597 (Wound Care 1st 20 sq cm) []  97598 (Wound Care each add'l 20 sq cm) []  97760 (Orthotic Fabrication, Fitting, Training Initial) []  [id number] (Prosthetic Management and Training Initial) []  [id number] (Orthotic or Prosthetic Training/ Modification Subsequent)  Jorrell Kuster C. Teeghan Hammer PT, DPT 08/05/22 9:59 AM

## 2022-08-12 ENCOUNTER — Encounter (HOSPITAL_BASED_OUTPATIENT_CLINIC_OR_DEPARTMENT_OTHER): Payer: Self-pay | Admitting: Physical Therapy

## 2022-08-12 ENCOUNTER — Ambulatory Visit (HOSPITAL_BASED_OUTPATIENT_CLINIC_OR_DEPARTMENT_OTHER): Payer: Medicare PPO | Admitting: Physical Therapy

## 2022-08-12 DIAGNOSIS — M542 Cervicalgia: Secondary | ICD-10-CM | POA: Diagnosis not present

## 2022-08-12 DIAGNOSIS — M5442 Lumbago with sciatica, left side: Secondary | ICD-10-CM | POA: Diagnosis not present

## 2022-08-12 DIAGNOSIS — R293 Abnormal posture: Secondary | ICD-10-CM | POA: Diagnosis not present

## 2022-08-12 NOTE — Therapy (Signed)
OUTPATIENT PHYSICAL THERAPY TREATMENT   Patient Name: Dominique Harrell MRN: [id number] DOB:1955/02/27, 67 y.o., female Today's Date: 08/12/2022  END OF SESSION:  PT End of Session - 08/12/22 1120     Visit Number 8    Number of Visits 8    Date for PT Re-Evaluation 08/12/22    Authorization Type [employer] MCR    PT Start Time 1120    PT Stop Time 1144    PT Time Calculation (min) 24 min    Activity Tolerance Patient tolerated treatment well    Behavior During Therapy WFL for tasks assessed/performed              Past Medical History:  Diagnosis Date   Hyperlipidemia    Insomnia    Migraine    Osteopenia    Past Surgical History:  Procedure Laterality Date   COLONOSCOPY  08/14/2007   w/Dr.Stark   excision of liposarcoma thigh Left    removed at [facility]; drop in Blood pressure per pt   OOPHORECTOMY  1999   left, dermoid cyst   Patient Active Problem List   Diagnosis Date Noted   Lumbar back pain with radiculopathy affecting right lower extremity 04/07/2015   Osteoporosis, 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   General medical examination 03/02/2011   SHOULDER IMPINGEMENT SYNDROME, LEFT 02/15/2010   Hyperlipidemia 06/08/2006   Depression 06/08/2006   MIGRAINE HEADACHE 06/08/2006    REFERRING PROVIDER:  Sheliah Hatch, MD    REFERRING DIAG: M54.2 (ICD-10-CM) - Neck pain   Rationale for Evaluation and Treatment: Rehabilitation  THERAPY DIAG:  Cervicalgia  ONSET DATE: 5 weeks ago   SUBJECTIVE:                                                                                                                                                                                           SUBJECTIVE STATEMENT: I think my low back is good, it feels a little better. I had a 2-day left-sided migraine.   PERTINENT HISTORY:  See PMH  PAIN:  Are you having pain? Yes: NPRS scale: minimal/10 Pain location: Lt cervical spine, thoracic spine Pain description:  tight Aggravating factors: Sidebending Relieving factors: Heat  PRECAUTIONS: None  WEIGHT BEARING RESTRICTIONS: No  FALLS:  Has patient fallen in last 6 months? No   OCCUPATION: Retired, volunteers at [employer]  PLOF: Independent  PATIENT GOALS: Decrease pain  OBJECTIVE:   DIAGNOSTIC FINDINGS:  Xray 09/08/20: IMPRESSION: 1. Multilevel degenerative disc disease and facet hypertrophy throughout the cervical spine. 2. Bilateral bony neural foraminal narrowing at C5-C6, suspected right neural foraminal narrowing at C4-C5.  PATIENT SURVEYS:  EVAL: FOTO  43 6/15: FOTO 63 6/29: FOTO 71    SENSATION: WFL  POSTURE:  Bilateral winging scapula   Body Part #1 Cervical  PALPATION: Eval: Spasm noted along bilateral cervical spine with more tenderness to palpation of the right side 5/28- Rt ant innom rotation 6/15- neutral innom alignment. Limited expansion thorough Lt post rib cage upon inhale in prone  Cervical ROM CERVICAL ROM:   Active ROM A/PROM (deg) eval 5/25 6/8 Pre/post tx 6/22 6/29  Flexion       Extension       Right lateral flexion 14 20     Left lateral flexion 28 24     Right rotation 48 54 48/60 50 50  Left rotation 40 50 58/60 54 50   (Blank rows = not tested)     TREATMENT:                                                                                                                               Treatment                            6/29:  Trigger Point Dry Needling, Manual Therapy Treatment:  Initial or subsequent education regarding Trigger Point Dry Needling: Subsequent Did patient give consent to treatment with Trigger Point Dry Needling: Yes TPDN with skilled palpation and monitoring followed by STM to the following muscles: Lt upper trap  Prone rib mobs   Treatment                            6/22:  Prone rib mobs bilaterally, Rt upper trap STM with scapular mobilization Qped protraction/retraction Hooklying  backstroke Mod dead bug with rib cage depression/core engagement: single arm/leg, 9090 from wall with ball bw knees   Treatment                            6/15:  Trigger Point Dry Needling, Manual Therapy Treatment:  Initial or subsequent education regarding Trigger Point Dry Needling: Subsequent Did patient give consent to treatment with Trigger Point Dry Needling: Yes TPDN with skilled palpation and monitoring followed by STM to the following muscles: Lt upper trap, Lt suboccipitals  Supine rib mobility, STM Lt thoracic paraspinals Scap retraction Door pec stretch Hooklying deep breath with hands on forehead- ab set in exhale Hooklying cervical small Rt side bend-Lt rotation-retraction   Treatment                            6/8:  Trigger Point Dry Needling, Manual Therapy Treatment:  Initial or subsequent education regarding Trigger Point Dry Needling: Subsequent Did patient give consent to treatment with Trigger Point Dry Needling: Yes TPDN with skilled palpation and monitoring followed by STM to the following muscles: bil upper trap,  bil levator scap STM Rt rhomoids/middle trap  Cervical traction, +suboccipital release, bil first rib depression mobs with cervical sidebend, Lt to Rt cervical mobs Supine chest stretch 3 breaths holds Supine horiz adduction stretch Supine STM Lt scalenes Chin tuck paired with cervical traction    PATIENT EDUCATION:  Education details: Teacher, music of condition, POC, HEP, exercise form/rationale Person educated: Patient Education method: Programmer, multimedia, Demonstration, Tactile cues, Verbal cues, and Handouts Education comprehension: verbalized understanding, returned demonstration, verbal cues required, tactile cues required, and needs further education  HOME EXERCISE PROGRAM: [email or web address]   ASSESSMENT:  CLINICAL IMPRESSION:  Pt has met all of her goals at this time and is prepared for d/c to independent program. Encouraged her to reach out to me  with any further question.   OBJECTIVE IMPAIRMENTS: decreased activity tolerance, decreased ROM, hypomobility, increased muscle spasms, impaired flexibility, impaired UE functional use, improper body mechanics, postural dysfunction, and pain.   ACTIVITY LIMITATIONS: carrying, lifting, sleeping, reach over head, and caring for others  PARTICIPATION LIMITATIONS: meal prep, cleaning, laundry, driving, shopping, community activity, and occupation  PERSONAL FACTORS:  Osteoporosis, history of left shoulder impingement  are also affecting patient's functional outcome.   REHAB POTENTIAL: Good  CLINICAL DECISION MAKING: Stable/uncomplicated  EVALUATION COMPLEXITY: Low   GOALS: Goals reviewed with patient? Yes  SHORT TERM GOALS: Target date: 07/08/22  Patient will be independent in mobility exercises without elevation of shoulders Baseline: Goal status:achieved   LONG TERM GOALS: Target date: POC date  Cervical sidebending rotation within 5 degrees left to right Baseline:  Goal status: achieved  2.  Resolution of left cervical spinal pain Baseline:  Goal status: partially met, on and off  3.  Independent with continued exercises for cervicothoracic stability and postural support Baseline:  Goal status: achieved  PLAN:  PT FREQUENCY: 1-2x/week  PT DURATION: 4 weeks  PLANNED INTERVENTIONS: Therapeutic exercises, Therapeutic activity, Neuromuscular re-education, Gait training, Patient/Family education, Self Care, Joint mobilization, Dry Needling, Electrical stimulation, Spinal mobilization, Cryotherapy, Moist heat, Taping, Traction, Manual therapy, and Re-evaluation.  PLAN FOR NEXT SESSION: Continue manual therapy, dry needling as needed, periscapular stability exercises   Phillis Thackeray C. Muscab Brenneman PT, DPT 08/12/22 11:55 AM   Referring diagnosis? M54.2 (ICD-10-CM) - Neck pain  Treatment diagnosis? (if different than referring diagnosis) M54.2 What was this (referring dx) caused  by? []  Surgery []  Fall []  Ongoing issue [x]  Arthritis []  Other: ____________  Laterality: []  Rt []  Lt [x]  Both  Check all possible CPT codes:  *CHOOSE 10 OR LESS*    [x]  97110 (Therapeutic Exercise)  []  92507 (SLP Treatment)  []  97112 (Neuro Re-ed)   []  92526 (Swallowing Treatment)   []  97116 (Gait Training)   []  [id number] (Cognitive Training, 1st 15 minutes) [x]  97140 (Manual Therapy)   []  97130 (Cognitive Training, each add'l 15 minutes)  [x]  97164 (Re-evaluation)                              []  Other, List CPT Code ____________  [x]  97530 (Therapeutic Activities)     [x]  97535 (Self Care)   []  All codes above (97110 - 97535)  []  97012 (Mechanical Traction)  []  97014 (E-stim Unattended)  []  16109 (E-stim manual)  []  97033 (Ionto)  []  97035 (Ultrasound) [x]  97750 (Physical Performance Training) []  [id number] (Aquatic Therapy) []  97016 (Vasopneumatic Device) []  [id number] (Paraffin) []  97034 (Contrast Bath) []  97597 (Wound Care 1st 20 sq cm) []  97598 (Wound Care each add'l  20 sq cm) []  97760 (Orthotic Fabrication, Fitting, Training Initial) []  [id number] (Prosthetic Management and Training Initial) []  [phone] (Orthotic or Prosthetic Training/ Modification Subsequent)  Kyrese Gartman C. Alvera Tourigny PT, DPT 08/12/22 11:55 AM

## 2022-08-23 ENCOUNTER — Ambulatory Visit: Payer: Medicare PPO | Admitting: Family Medicine

## 2022-09-06 ENCOUNTER — Ambulatory Visit: Payer: Medicare PPO | Admitting: Family Medicine

## 2022-09-06 ENCOUNTER — Encounter: Payer: Self-pay | Admitting: Family Medicine

## 2022-09-06 DIAGNOSIS — E785 Hyperlipidemia, unspecified: Secondary | ICD-10-CM | POA: Diagnosis not present

## 2022-09-06 DIAGNOSIS — R0981 Nasal congestion: Secondary | ICD-10-CM

## 2022-09-06 LAB — CBC WITH DIFFERENTIAL/PLATELET
Basophils Absolute: 0 10*3/uL (ref 0.0–0.1)
Basophils Relative: 0.3 % (ref 0.0–3.0)
Eosinophils Absolute: 0.1 10*3/uL (ref 0.0–0.7)
Eosinophils Relative: 1.9 % (ref 0.0–5.0)
HCT: 41.8 % (ref 36.0–46.0)
Hemoglobin: 13.8 g/dL (ref 12.0–15.0)
Lymphocytes Relative: 33 % (ref 12.0–46.0)
Lymphs Abs: 1.8 10*3/uL (ref 0.7–4.0)
MCHC: 33.1 g/dL (ref 30.0–36.0)
MCV: 95.5 fl (ref 78.0–100.0)
Monocytes Absolute: 0.3 10*3/uL (ref 0.1–1.0)
Monocytes Relative: 4.7 % (ref 3.0–12.0)
Neutro Abs: 3.4 10*3/uL (ref 1.4–7.7)
Neutrophils Relative %: 60.1 % (ref 43.0–77.0)
Platelets: 197 10*3/uL (ref 150.0–400.0)
RBC: 4.37 Mil/uL (ref 3.87–5.11)
RDW: 13.5 % (ref 11.5–15.5)
WBC: 5.6 10*3/uL (ref 4.0–10.5)

## 2022-09-06 LAB — HEPATIC FUNCTION PANEL
ALT: 27 U/L (ref 0–35)
AST: 27 U/L (ref 0–37)
Albumin: 4.9 g/dL (ref 3.5–5.2)
Alkaline Phosphatase: 39 U/L (ref 39–117)
Bilirubin, Direct: 0.2 mg/dL (ref 0.0–0.3)
Total Bilirubin: 1 mg/dL (ref 0.2–1.2)
Total Protein: 7.4 g/dL (ref 6.0–8.3)

## 2022-09-06 LAB — LIPID PANEL
Cholesterol: 166 mg/dL (ref 0–200)
HDL: 66.9 mg/dL (ref >39.00)
LDL Cholesterol: 85 mg/dL (ref 0–99)
NonHDL: 99.41
Total CHOL/HDL Ratio: 2
Triglycerides: 70 mg/dL (ref 0.0–149.0)
VLDL: 14 mg/dL (ref 0.0–40.0)

## 2022-09-06 LAB — TSH: TSH: 1.35 u[IU]/mL (ref 0.35–5.50)

## 2022-09-06 LAB — BASIC METABOLIC PANEL
BUN: 17 mg/dL (ref 6–23)
CO2: 30 mEq/L (ref 19–32)
Calcium: 9.9 mg/dL (ref 8.4–10.5)
Chloride: 102 mEq/L (ref 96–112)
Creatinine, Ser: 0.71 mg/dL (ref 0.40–1.20)
GFR: 87.92 mL/min (ref >60.00)
Glucose, Bld: 94 mg/dL (ref 70–99)
Potassium: 4.3 mEq/L (ref 3.5–5.1)
Sodium: 141 mEq/L (ref 135–145)

## 2022-09-06 MED ORDER — FLUTICASONE PROPIONATE 50 MCG/ACT NA SUSP
NASAL | 3 refills | Status: AC
Start: 2022-09-06 — End: ?

## 2022-09-06 MED ORDER — SIMVASTATIN 40 MG PO TABS
ORAL_TABLET | ORAL | 1 refills | Status: DC
Start: 2022-09-06 — End: 2023-06-04

## 2022-09-06 NOTE — Assessment & Plan Note (Signed)
Chronic problem.  Tolerating statin w/o difficulty.  Check labs.  Adjust meds prn  

## 2022-09-06 NOTE — Progress Notes (Signed)
   Subjective:    Patient ID: Dominique Harrell, female    DOB: 1955/07/09, 67 y.o.   MRN: [id number]  HPI Hyperlipidemia- chronic problem, on Simvastatin 40mg  daily.  Pt is down 8 lbs since last visit.  Denies CP, SOB, abd pain, N/V   Review of Systems For ROS see HPI     Objective:   Physical Exam Vitals reviewed.  Constitutional:      General: She is not in acute distress.    Appearance: She is well-developed.  HENT:     Head: Normocephalic and atraumatic.  Eyes:     Conjunctiva/sclera: Conjunctivae normal.     Pupils: Pupils are equal, round, and reactive to light.  Neck:     Thyroid: No thyromegaly.  Cardiovascular:     Rate and Rhythm: Normal rate and regular rhythm.     Pulses: Normal pulses.     Heart sounds: Normal heart sounds. No murmur heard. Pulmonary:     Effort: Pulmonary effort is normal. No respiratory distress.     Breath sounds: Normal breath sounds.  Abdominal:     General: There is no distension.     Palpations: Abdomen is soft.     Tenderness: There is no abdominal tenderness.  Musculoskeletal:     Cervical back: Normal range of motion and neck supple.     Right lower leg: No edema.     Left lower leg: No edema.  Lymphadenopathy:     Cervical: No cervical adenopathy.  Skin:    General: Skin is warm and dry.  Neurological:     General: No focal deficit present.     Mental Status: She is alert and oriented to person, place, and time.  Psychiatric:        Mood and Affect: Mood normal.        Behavior: Behavior normal.        Thought Content: Thought content normal.           Assessment & Plan:

## 2022-09-06 NOTE — Patient Instructions (Signed)
Schedule your complete physical in 6 months We'll notify you of your lab results and make any changes if needed Keep up the good work on healthy diet and regular exercise- you look great!! Call with any questions or concrens Stay Safe!  Stay Healthy! Enjoy the rest of your summer!!!

## 2022-09-07 ENCOUNTER — Telehealth: Payer: Self-pay

## 2022-09-07 NOTE — Telephone Encounter (Signed)
-----   Message from Neena Rhymes sent at 09/07/2022  7:42 AM EDT ----- Labs look great!  No changes at this time

## 2022-09-14 DIAGNOSIS — N952 Postmenopausal atrophic vaginitis: Secondary | ICD-10-CM | POA: Diagnosis not present

## 2022-09-14 DIAGNOSIS — Z780 Asymptomatic menopausal state: Secondary | ICD-10-CM | POA: Diagnosis not present

## 2022-09-14 DIAGNOSIS — Z01419 Encounter for gynecological examination (general) (routine) without abnormal findings: Secondary | ICD-10-CM | POA: Diagnosis not present

## 2022-09-14 DIAGNOSIS — N9419 Other specified dyspareunia: Secondary | ICD-10-CM | POA: Diagnosis not present

## 2022-12-04 ENCOUNTER — Other Ambulatory Visit: Payer: Self-pay | Admitting: Family Medicine

## 2022-12-04 DIAGNOSIS — E785 Hyperlipidemia, unspecified: Secondary | ICD-10-CM

## 2022-12-23 ENCOUNTER — Encounter: Payer: Self-pay | Admitting: Family Medicine

## 2022-12-23 DIAGNOSIS — M5416 Radiculopathy, lumbar region: Secondary | ICD-10-CM

## 2022-12-26 ENCOUNTER — Encounter (HOSPITAL_BASED_OUTPATIENT_CLINIC_OR_DEPARTMENT_OTHER): Payer: Self-pay | Admitting: Physical Therapy

## 2023-01-04 ENCOUNTER — Ambulatory Visit (HOSPITAL_BASED_OUTPATIENT_CLINIC_OR_DEPARTMENT_OTHER): Payer: Medicare PPO | Attending: Family Medicine | Admitting: Physical Therapy

## 2023-01-04 ENCOUNTER — Encounter (HOSPITAL_BASED_OUTPATIENT_CLINIC_OR_DEPARTMENT_OTHER): Payer: Self-pay | Admitting: Physical Therapy

## 2023-01-04 ENCOUNTER — Other Ambulatory Visit: Payer: Self-pay

## 2023-01-04 DIAGNOSIS — M5459 Other low back pain: Secondary | ICD-10-CM | POA: Insufficient documentation

## 2023-01-04 DIAGNOSIS — R293 Abnormal posture: Secondary | ICD-10-CM | POA: Insufficient documentation

## 2023-01-04 DIAGNOSIS — M5416 Radiculopathy, lumbar region: Secondary | ICD-10-CM | POA: Diagnosis not present

## 2023-01-04 NOTE — Therapy (Signed)
OUTPATIENT PHYSICAL THERAPY EVALUATION   Patient Name: Dominique Harrell MRN: [id number] DOB:[birth year], 67 y.o., female Today's Date: 01/04/2023  END OF SESSION:  PT End of Session - 01/04/23 1651     Visit Number 1    Number of Visits 13    Date for PT Re-Evaluation 02/17/23    Authorization Type [employer] MCR    PT Start Time 1604    PT Stop Time 1640    PT Time Calculation (min) 36 min    Activity Tolerance Patient tolerated treatment well    Behavior During Therapy WFL for tasks assessed/performed             Past Medical History:  Diagnosis Date   Hyperlipidemia    Insomnia    Migraine    Osteopenia    Past Surgical History:  Procedure Laterality Date   COLONOSCOPY  08/14/2007   w/Dr.Stark   excision of liposarcoma thigh Left    removed at [facility]; drop in Blood pressure per pt   OOPHORECTOMY  1999   left, dermoid cyst   Patient Active Problem List   Diagnosis Date Noted   Lumbar back pain with radiculopathy affecting right lower extremity 04/07/2015   Osteoporosis, 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   General medical examination 03/02/2011   SHOULDER IMPINGEMENT SYNDROME, LEFT 02/15/2010   Hyperlipidemia 06/08/2006   Depression 06/08/2006   MIGRAINE HEADACHE 06/08/2006     REFERRING PROVIDER:  Sheliah Hatch, MD     REFERRING DIAG:  M54.16 (ICD-10-CM) - Lumbar back pain with radiculopathy affecting right lower extremity     Rationale for Evaluation and Treatment: Rehabilitation  THERAPY DIAG:  Other low back pain  Abnormal posture  ONSET DATE: approx 12/13/22   SUBJECTIVE:                                                                                                                                                                                           SUBJECTIVE STATEMENT: 2.5 weeks ago pulled low drawer out to get a pair of pants. My thoracic spine is a constant ache and was present prior.   PERTINENT HISTORY:  Chronic spinal pain,  osteopenia  PAIN:  Are you having pain? Yes: NPRS scale: moderate/10 Pain location: across lower back Pain description: stiff Aggravating factors: sitting too long Relieving factors: move around  PRECAUTIONS:  None  RED FLAGS: None   WEIGHT BEARING RESTRICTIONS:  No  FALLS:  Has patient fallen in last 6 months? No    PATIENT GOALS:  Decrease pain, move easily   OBJECTIVE:  Note: Objective measures were completed at Evaluation unless otherwise noted.  PATIENT SURVEYS:  FOTO 51  COGNITIVE STATUS: Within functional limits for tasks assessed   SENSATION: Eval: some N/T down legs    POSTURE:  Eval: Lt post /Rt ant innom rotation.   HAND DOMINANCE:  Left   Body Part #1 Lumbar  PALPATION: Lt post/Rt ant inom rotation at eval    TREATMENT:                                                                                                                               DATE: eval 01/04/23   Lt hip flexors/Rt extensors MET Rt spring mob for Rt post rot of innominate in supine Prone spring mobs Lt upper quadrant sacral PA mobs Supine LLE Long axis distraction Seated scapular distraction & trigger point release to rhomboids- Rt  PATIENT EDUCATION:  Education details: Teacher, music of condition, POC, HEP, exercise form/rationale Person educated: Patient Education method: Explanation, Demonstration, Tactile cues, and Verbal cues Education comprehension: verbalized understanding, returned demonstration, verbal cues required, tactile cues required, and needs further education  HOME EXERCISE PROGRAM: Long axis distraction to Lt LE BID   ASSESSMENT:  CLINICAL IMPRESSION: Patient is a 67 y.o. F who was seen today for physical therapy evaluation and treatment for acute onset of LBP. Notable innominate rotation and resultant functional LLD. Partial correction followed MET but able to complete with LLE LAD which she reports her husband will be able to do while she is on  vacation. Will be driving in a car for 6 hours tomorrow so I encouraged her to hold a ball or pillow between her knees to encourage equal posture.     OBJECTIVE IMPAIRMENTS: decreased activity tolerance, decreased mobility, difficulty walking, decreased strength, increased muscle spasms, improper body mechanics, postural dysfunction, and pain.   ACTIVITY LIMITATIONS: carrying, lifting, bending, sitting, standing, squatting, sleeping, stairs, transfers, bed mobility, bathing, dressing, locomotion level, and caring for others  PERSONAL FACTORS: Time since onset of injury/illness/exacerbation and osteopenia  are also affecting patient's functional outcome.   REHAB POTENTIAL: Good  CLINICAL DECISION MAKING: Evolving/moderate complexity  EVALUATION COMPLEXITY: Moderate   GOALS: Goals reviewed with patient? Yes  SHORT TERM GOALS: Target date: 12/30  Perform HEP to manage pain through car trip Baseline: Goal status: INITIAL    LONG TERM GOALS: Target date:  POC date  Able to demo proper hip hinge Baseline:  Goal status: INITIAL  2.  Bil hip abd strength 5/5 Baseline:  Goal status: INITIAL  3.  Progressing appropriate core HEP Baseline:  Goal status: INITIAL  4.  Meet FOTO goal Baseline:  Goal status: INITIAL    PLAN:  PT FREQUENCY: 1-2x/week  PT DURATION: 6 weeks  PLANNED INTERVENTIONS: 97164- PT Re-evaluation, 97110-Therapeutic exercises, 97530- Therapeutic activity, 97112- Neuromuscular re-education, 97535- Self Care, 65784- Manual therapy, [phone]- Gait training, [phone]- Aquatic Therapy, Patient/Family education, Balance training, Stair training, Taping, Dry Needling, Joint mobilization, Spinal mobilization, Cryotherapy, and Moist heat.  PLAN FOR NEXT SESSION: recheck innom rotation  [place]  Dewain Penning PT, DPT 01/06/23 11:28 AM    Referring diagnosis? M54.16 Treatment diagnosis? (if different than referring diagnosis) M54.59  R29.3 What was this (referring  dx) caused by? []  Surgery []  Fall [x]  Ongoing issue []  Arthritis []  Other: ____________  Laterality: []  Rt []  Lt [x]  Both  Check all possible CPT codes:  *CHOOSE 10 OR LESS*    See Planned Interventions listed in the Plan section of the Evaluation.

## 2023-01-09 ENCOUNTER — Ambulatory Visit (HOSPITAL_BASED_OUTPATIENT_CLINIC_OR_DEPARTMENT_OTHER): Payer: Medicare PPO | Admitting: Physical Therapy

## 2023-01-09 ENCOUNTER — Encounter (HOSPITAL_BASED_OUTPATIENT_CLINIC_OR_DEPARTMENT_OTHER): Payer: Self-pay | Admitting: Physical Therapy

## 2023-01-09 DIAGNOSIS — M5459 Other low back pain: Secondary | ICD-10-CM | POA: Diagnosis not present

## 2023-01-09 DIAGNOSIS — R293 Abnormal posture: Secondary | ICD-10-CM

## 2023-01-09 DIAGNOSIS — M5416 Radiculopathy, lumbar region: Secondary | ICD-10-CM | POA: Diagnosis not present

## 2023-01-09 NOTE — Therapy (Signed)
OUTPATIENT PHYSICAL THERAPY TREATMENT   Patient Name: Dominique Harrell MRN: [id number] DOB:1955/07/23, 67 y.o., female Today's Date: 01/09/2023  END OF SESSION:  PT End of Session - 01/09/23 1011     Visit Number 2    Number of Visits 13    Date for PT Re-Evaluation 02/17/23    Authorization Type [employer] MCR    PT Start Time 1010    PT Stop Time 1055    PT Time Calculation (min) 45 min    Activity Tolerance Patient tolerated treatment well    Behavior During Therapy WFL for tasks assessed/performed              Past Medical History:  Diagnosis Date   Hyperlipidemia    Insomnia    Migraine    Osteopenia    Past Surgical History:  Procedure Laterality Date   COLONOSCOPY  08/14/2007   w/Dr.Stark   excision of liposarcoma thigh Left    removed at [facility]; drop in Blood pressure per pt   OOPHORECTOMY  1999   left, dermoid cyst   Patient Active Problem List   Diagnosis Date Noted   Lumbar back pain with radiculopathy affecting right lower extremity 04/07/2015   Osteoporosis, 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   General medical examination 03/02/2011   SHOULDER IMPINGEMENT SYNDROME, LEFT 02/15/2010   Hyperlipidemia 06/08/2006   Depression 06/08/2006   MIGRAINE HEADACHE 06/08/2006     REFERRING PROVIDER:  Sheliah Hatch, MD     REFERRING DIAG:  M54.16 (ICD-10-CM) - Lumbar back pain with radiculopathy affecting right lower extremity     Rationale for Evaluation and Treatment: Rehabilitation  THERAPY DIAG:  Other low back pain  Abnormal posture  ONSET DATE: approx 12/13/22   SUBJECTIVE:                                                                                                                                                                                           SUBJECTIVE STATEMENT: It hurts after doing a lot of sitting. Husband did LAD and it was helpful.   PERTINENT HISTORY:  Chronic spinal pain, osteopenia  PAIN:  Are you having pain?  Yes: NPRS scale: moderate/10 Pain location: across lower back Pain description: stiff Aggravating factors: sitting too long Relieving factors: move around  PRECAUTIONS:  None  RED FLAGS: None   WEIGHT BEARING RESTRICTIONS:  No  FALLS:  Has patient fallen in last 6 months? No    PATIENT GOALS:  Decrease pain, move easily   OBJECTIVE:  Note: Objective measures were completed at Evaluation unless otherwise noted.  PATIENT SURVEYS:  FOTO 51  COGNITIVE STATUS: Within functional limits  for tasks assessed   SENSATION: Eval: some N/T down legs    POSTURE:  Eval: Lt post /Rt ant innom rotation.   HAND DOMINANCE:  Left   Body Part #1 Lumbar  PALPATION: Lt post/Rt ant inom rotation at eval    TREATMENT:                                                                                                                               Treatment                            11/26:  LLE LAD Lt hip lateral distraction Prone rib mobs, STM at thoracolumbar junction Prone alt superman 5s holds- PT providing thoracic pressure in Lt UE/Rt LE lift Ab set with PPT, ball bw knees Rt Scapular distraction   DATE: eval 01/04/23   Lt hip flexors/Rt extensors MET Rt spring mob for Rt post rot of innominate in supine Prone spring mobs Lt upper quadrant sacral PA mobs Supine LLE Long axis distraction Seated scapular distraction & trigger point release to rhomboids- Rt  PATIENT EDUCATION:  Education details: Teacher, music of condition, POC, HEP, exercise form/rationale Person educated: Patient Education method: Explanation, Demonstration, Tactile cues, and Verbal cues Education comprehension: verbalized understanding, returned demonstration, verbal cues required, tactile cues required, and needs further education  HOME EXERCISE PROGRAM: Long axis distraction to Lt LE BID   ASSESSMENT:  CLINICAL IMPRESSION: Continued spasm from Lt hip to Right shoulder- decreased with manual  therapy. I would like an image of her left hip as there is not one available in her chart to rule out FA involvement- will message dr Beverely Low.    OBJECTIVE IMPAIRMENTS: decreased activity tolerance, decreased mobility, difficulty walking, decreased strength, increased muscle spasms, improper body mechanics, postural dysfunction, and pain.   ACTIVITY LIMITATIONS: carrying, lifting, bending, sitting, standing, squatting, sleeping, stairs, transfers, bed mobility, bathing, dressing, locomotion level, and caring for others  PERSONAL FACTORS: Time since onset of injury/illness/exacerbation and osteopenia  are also affecting patient's functional outcome.   REHAB POTENTIAL: Good  CLINICAL DECISION MAKING: Evolving/moderate complexity  EVALUATION COMPLEXITY: Moderate   GOALS: Goals reviewed with patient? Yes  SHORT TERM GOALS: Target date: 12/30  Perform HEP to manage pain through car trip Baseline: Goal status: INITIAL    LONG TERM GOALS: Target date:  POC date  Able to demo proper hip hinge Baseline:  Goal status: INITIAL  2.  Bil hip abd strength 5/5 Baseline:  Goal status: INITIAL  3.  Progressing appropriate core HEP Baseline:  Goal status: INITIAL  4.  Meet FOTO goal Baseline:  Goal status: INITIAL    PLAN:  PT FREQUENCY: 1-2x/week  PT DURATION: 6 weeks  PLANNED INTERVENTIONS: 97164- PT Re-evaluation, 97110-Therapeutic exercises, 97530- Therapeutic activity, [id number]- Neuromuscular re-education, 97535- Self Care, 54650- Manual therapy, [phone]- Gait training, [phone]- Aquatic Therapy, Patient/Family education, Balance training, Stair training, Taping, Dry Needling,  Joint mobilization, Spinal mobilization, Cryotherapy, and Moist heat.  PLAN FOR NEXT SESSION: recheck innom rotation  Dominique Harrell C. Eliyohu Class PT, DPT 01/09/23 10:57 AM    Referring diagnosis? M54.16 Treatment diagnosis? (if different than referring diagnosis) M54.59  R29.3 What was this (referring dx)  caused by? []  Surgery []  Fall [x]  Ongoing issue []  Arthritis []  Other: ____________  Laterality: []  Rt []  Lt [x]  Both  Check all possible CPT codes:  *CHOOSE 10 OR LESS*    See Planned Interventions listed in the Plan section of the Evaluation.

## 2023-01-16 ENCOUNTER — Other Ambulatory Visit: Payer: Self-pay | Admitting: Family Medicine

## 2023-01-16 ENCOUNTER — Ambulatory Visit (HOSPITAL_BASED_OUTPATIENT_CLINIC_OR_DEPARTMENT_OTHER)
Admission: RE | Admit: 2023-01-16 | Discharge: 2023-01-16 | Disposition: A | Discharge status: Home / Self Care | Payer: Medicare PPO | Source: Ambulatory Visit | Attending: Family Medicine | Admitting: Family Medicine

## 2023-01-16 DIAGNOSIS — M25552 Pain in left hip: Secondary | ICD-10-CM | POA: Diagnosis not present

## 2023-01-16 DIAGNOSIS — M5416 Radiculopathy, lumbar region: Secondary | ICD-10-CM | POA: Insufficient documentation

## 2023-01-16 DIAGNOSIS — M160 Bilateral primary osteoarthritis of hip: Secondary | ICD-10-CM | POA: Diagnosis not present

## 2023-01-18 ENCOUNTER — Encounter (HOSPITAL_BASED_OUTPATIENT_CLINIC_OR_DEPARTMENT_OTHER): Payer: Self-pay | Admitting: Physical Therapy

## 2023-01-18 ENCOUNTER — Ambulatory Visit (HOSPITAL_BASED_OUTPATIENT_CLINIC_OR_DEPARTMENT_OTHER): Payer: Medicare PPO | Attending: Family Medicine | Admitting: Physical Therapy

## 2023-01-18 DIAGNOSIS — M5459 Other low back pain: Secondary | ICD-10-CM | POA: Diagnosis not present

## 2023-01-18 DIAGNOSIS — M542 Cervicalgia: Secondary | ICD-10-CM | POA: Diagnosis not present

## 2023-01-18 DIAGNOSIS — R293 Abnormal posture: Secondary | ICD-10-CM | POA: Diagnosis not present

## 2023-01-18 NOTE — Therapy (Signed)
OUTPATIENT PHYSICAL THERAPY TREATMENT   Patient Name: Dominique Harrell MRN: [id number] DOB:May 09, 1955, 67 y.o., female Today's Date: 01/18/2023  END OF SESSION:  PT End of Session - 01/18/23 1101     Visit Number 3    Number of Visits 13    Date for PT Re-Evaluation 02/17/23    Authorization Type [employer] MCR    PT Start Time 1100    PT Stop Time 1142    PT Time Calculation (min) 42 min    Activity Tolerance Patient tolerated treatment well    Behavior During Therapy WFL for tasks assessed/performed              Past Medical History:  Diagnosis Date   Hyperlipidemia    Insomnia    Migraine    Osteopenia    Past Surgical History:  Procedure Laterality Date   COLONOSCOPY  08/14/2007   w/Dr.Stark   excision of liposarcoma thigh Left    removed at [facility]; drop in Blood pressure per pt   OOPHORECTOMY  1999   left, dermoid cyst   Patient Active Problem List   Diagnosis Date Noted   Lumbar back pain with radiculopathy affecting right lower extremity 04/07/2015   Osteoporosis, 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   General medical examination 03/02/2011   SHOULDER IMPINGEMENT SYNDROME, LEFT 02/15/2010   Hyperlipidemia 06/08/2006   Depression 06/08/2006   MIGRAINE HEADACHE 06/08/2006     REFERRING PROVIDER:  Sheliah Hatch, MD     REFERRING DIAG:  M54.16 (ICD-10-CM) - Lumbar back pain with radiculopathy affecting right lower extremity     Rationale for Evaluation and Treatment: Rehabilitation  THERAPY DIAG:  Other low back pain  Abnormal posture  ONSET DATE: approx 12/13/22   SUBJECTIVE:                                                                                                                                                                                           SUBJECTIVE STATEMENT: Pulling laundry out of dryer on Monday and back went out- all pain in Lt SIJ.   PERTINENT HISTORY:  Chronic spinal pain, osteopenia  PAIN:  Are you having pain?  Yes: NPRS scale: moderate/10 Pain location: across lower back Pain description: stiff Aggravating factors: sitting too long Relieving factors: move around  PRECAUTIONS:  None  RED FLAGS: None   WEIGHT BEARING RESTRICTIONS:  No  FALLS:  Has patient fallen in last 6 months? No    PATIENT GOALS:  Decrease pain, move easily   OBJECTIVE:  Note: Objective measures were completed at Evaluation unless otherwise noted.  PATIENT SURVEYS:  FOTO 51  COGNITIVE STATUS: Within functional  limits for tasks assessed   SENSATION: Eval: some N/T down legs    POSTURE:  Eval: Lt post /Rt ant innom rotation.   HAND DOMINANCE:  Left   Body Part #1 Lumbar  PALPATION: Lt post/Rt ant inom rotation at eval    TREATMENT:                                                                                                                               Treatment                            12/5:  Supine AP mob to Rt ASIS Double layer heel lift added to Lt shoe Trigger Point Dry Needling, Manual Therapy Treatment:  Initial or subsequent education regarding Trigger Point Dry Needling: Subsequent Did patient give consent to treatment with Trigger Point Dry Needling: Yes TPDN with skilled palpation and monitoring followed by STM to the following muscles: bilat glut med, Rt glut max moving lateral to medial, Rt L3-4  Qped rocking- lacking Lt SIJ opening- lacking Lt SIJ mobility Prone PA spring mobs Lt upper and mid sacrum Standing hip hinge Fluid movement with gait pattern   Treatment                            11/26:  LLE LAD Lt hip lateral distraction Prone rib mobs, STM at thoracolumbar junction Prone alt superman 5s holds- PT providing thoracic pressure in Lt UE/Rt LE lift Ab set with PPT, ball bw knees Rt Scapular distraction   DATE: eval 01/04/23   Lt hip flexors/Rt extensors MET Rt spring mob for Rt post rot of innominate in supine Prone spring mobs Lt upper quadrant  sacral PA mobs Supine LLE Long axis distraction Seated scapular distraction & trigger point release to rhomboids- Rt  PATIENT EDUCATION:  Education details: Teacher, music of condition, POC, HEP, exercise form/rationale Person educated: Patient Education method: Explanation, Demonstration, Tactile cues, and Verbal cues Education comprehension: verbalized understanding, returned demonstration, verbal cues required, tactile cues required, and needs further education  HOME EXERCISE PROGRAM: Long axis distraction to Lt LE BID [id number]   ASSESSMENT:  CLINICAL IMPRESSION: Able to demo hip hinge when cued to press pelvis posteriorly and weight in heels. Notable lack of mobility in Lt SIJ resulting in right-sided spasm. Added 2-layer heel lift to left shoe- encouraged her to remove it should concordant pain increase. Will be walking a 5k this weekend in [place].    OBJECTIVE IMPAIRMENTS: decreased activity tolerance, decreased mobility, difficulty walking, decreased strength, increased muscle spasms, improper body mechanics, postural dysfunction, and pain.   ACTIVITY LIMITATIONS: carrying, lifting, bending, sitting, standing, squatting, sleeping, stairs, transfers, bed mobility, bathing, dressing, locomotion level, and caring for others  PERSONAL FACTORS: Time since onset of injury/illness/exacerbation and osteopenia  are also affecting patient's functional outcome.   REHAB POTENTIAL: Good  CLINICAL  DECISION MAKING: Evolving/moderate complexity  EVALUATION COMPLEXITY: Moderate   GOALS: Goals reviewed with patient? Yes  SHORT TERM GOALS: Target date: 12/30  Perform HEP to manage pain through car trip Baseline: Goal status: INITIAL    LONG TERM GOALS: Target date:  POC date  Able to demo proper hip hinge Baseline:  Goal status: INITIAL  2.  Bil hip abd strength 5/5 Baseline:  Goal status: INITIAL  3.  Progressing appropriate core HEP Baseline:  Goal status: INITIAL  4.   Meet FOTO goal Baseline:  Goal status: INITIAL    PLAN:  PT FREQUENCY: 1-2x/week  PT DURATION: 6 weeks  PLANNED INTERVENTIONS: 97164- PT Re-evaluation, 97110-Therapeutic exercises, 97530- Therapeutic activity, 97112- Neuromuscular re-education, 97535- Self Care, 91478- Manual therapy, [phone]- Gait training, [phone]- Aquatic Therapy, Patient/Family education, Balance training, Stair training, Taping, Dry Needling, Joint mobilization, Spinal mobilization, Cryotherapy, and Moist heat.  PLAN FOR NEXT SESSION: outcome of heel lift?  Jhene Westmoreland C. Braidyn Peace PT, DPT 01/18/23 11:56 AM    Referring diagnosis? M54.16 Treatment diagnosis? (if different than referring diagnosis) M54.59  R29.3 What was this (referring dx) caused by? []  Surgery []  Fall [x]  Ongoing issue []  Arthritis []  Other: ____________  Laterality: []  Rt []  Lt [x]  Both  Check all possible CPT codes:  *CHOOSE 10 OR LESS*    See Planned Interventions listed in the Plan section of the Evaluation.

## 2023-01-29 ENCOUNTER — Telehealth: Payer: Self-pay

## 2023-01-29 NOTE — Telephone Encounter (Signed)
Pt has been notified.

## 2023-01-29 NOTE — Telephone Encounter (Signed)
-----   Message from Neena Rhymes sent at 01/29/2023  7:22 AM EST ----- Mild arthritis seen- no other abnormalities

## 2023-02-02 ENCOUNTER — Encounter (HOSPITAL_BASED_OUTPATIENT_CLINIC_OR_DEPARTMENT_OTHER): Payer: Self-pay | Admitting: Physical Therapy

## 2023-02-02 ENCOUNTER — Ambulatory Visit (HOSPITAL_BASED_OUTPATIENT_CLINIC_OR_DEPARTMENT_OTHER): Payer: Medicare PPO | Admitting: Physical Therapy

## 2023-02-02 DIAGNOSIS — M5459 Other low back pain: Secondary | ICD-10-CM

## 2023-02-02 DIAGNOSIS — R293 Abnormal posture: Secondary | ICD-10-CM | POA: Diagnosis not present

## 2023-02-02 DIAGNOSIS — M542 Cervicalgia: Secondary | ICD-10-CM | POA: Diagnosis not present

## 2023-02-02 NOTE — Therapy (Signed)
OUTPATIENT PHYSICAL THERAPY TREATMENT   Patient Name: Dominique Harrell MRN: [id number] DOB:06-24-1955, 67 y.o., female Today's Date: 02/02/2023  END OF SESSION:  PT End of Session - 02/02/23 0934     Visit Number 4    Number of Visits 13    Date for PT Re-Evaluation 04/13/23    Authorization Type [employer] MCR    PT Start Time 0932    PT Stop Time 1013    PT Time Calculation (min) 41 min    Activity Tolerance Patient tolerated treatment well    Behavior During Therapy [facility] for tasks assessed/performed               Past Medical History:  Diagnosis Date   Hyperlipidemia    Insomnia    Migraine    Osteopenia    Past Surgical History:  Procedure Laterality Date   COLONOSCOPY  08/14/2007   w/Dr.Stark   excision of liposarcoma thigh Left    removed at [facility]; drop in Blood pressure per pt   OOPHORECTOMY  1999   left, dermoid cyst   Patient Active Problem List   Diagnosis Date Noted   Lumbar back pain with radiculopathy affecting right lower extremity 04/07/2015   Osteoporosis, 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   General medical examination 03/02/2011   SHOULDER IMPINGEMENT SYNDROME, LEFT 02/15/2010   Hyperlipidemia 06/08/2006   Depression 06/08/2006   MIGRAINE HEADACHE 06/08/2006     REFERRING PROVIDER:  Sheliah Hatch, MD     REFERRING DIAG:  M54.16 (ICD-10-CM) - Lumbar back pain with radiculopathy affecting right lower extremity     Rationale for Evaluation and Treatment: Rehabilitation  THERAPY DIAG:  Other low back pain  Abnormal posture  Cervicalgia  ONSET DATE: approx 12/13/22   SUBJECTIVE:                                                                                                                                                                                           SUBJECTIVE STATEMENT: Stiff but nothing hurts  PERTINENT HISTORY:  Chronic spinal pain, osteopenia  PAIN:  Are you having pain? Yes: NPRS scale: 0/10 Pain  location: across lower back Pain description: stiff Aggravating factors: sitting too long Relieving factors: move around  PRECAUTIONS:  None  RED FLAGS: None   WEIGHT BEARING RESTRICTIONS:  No  FALLS:  Has patient fallen in last 6 months? No    PATIENT GOALS:  Decrease pain, move easily   OBJECTIVE:  Note: Objective measures were completed at Evaluation unless otherwise noted.  PATIENT SURVEYS:  FOTO 51  COGNITIVE STATUS: Within functional limits for tasks assessed   SENSATION: Eval: some N/T  down legs 12/20: no s/s of nerve in last week  POSTURE:  Eval: Lt post /Rt ant innom rotation.   HAND DOMINANCE:  Left   Body Part #1 Lumbar  PALPATION: Lt post/Rt ant inom rotation at eval    TREATMENT:                                                                                                                               Treatment                            12/20:  Ball plank roll out Standing shoulder flexion to 90 post resist, bilat & single Hip hinge head lift 1lb bar Lateral anchored lower body rotation   Treatment                            12/5:  Supine AP mob to Rt ASIS Double layer heel lift added to Lt shoe Trigger Point Dry Needling, Manual Therapy Treatment:  Initial or subsequent education regarding Trigger Point Dry Needling: Subsequent Did patient give consent to treatment with Trigger Point Dry Needling: Yes TPDN with skilled palpation and monitoring followed by STM to the following muscles: bilat glut med, Rt glut max moving lateral to medial, Rt L3-4  Qped rocking- lacking Lt SIJ opening- lacking Lt SIJ mobility Prone PA spring mobs Lt upper and mid sacrum Standing hip hinge Fluid movement with gait pattern   Treatment                            11/26:  LLE LAD Lt hip lateral distraction Prone rib mobs, STM at thoracolumbar junction Prone alt superman 5s holds- PT providing thoracic pressure in Lt UE/Rt LE lift Ab set with  PPT, ball bw knees Rt Scapular distraction    PATIENT EDUCATION:  Education details: Anatomy of condition, POC, HEP, exercise form/rationale Person educated: Patient Education method: Explanation, Demonstration, Tactile cues, and Verbal cues Education comprehension: verbalized understanding, returned demonstration, verbal cues required, tactile cues required, and needs further education  HOME EXERCISE PROGRAM: Long axis distraction to Lt LE BID [id number]   ASSESSMENT:  CLINICAL IMPRESSION: Functional motion focus with encouragement to utilize hip hinge. Doing well in heel lift and will continue to wear and focus on standing/CKC strength through lumbopelvic region.     OBJECTIVE IMPAIRMENTS: decreased activity tolerance, decreased mobility, difficulty walking, decreased strength, increased muscle spasms, improper body mechanics, postural dysfunction, and pain.   ACTIVITY LIMITATIONS: carrying, lifting, bending, sitting, standing, squatting, sleeping, stairs, transfers, bed mobility, bathing, dressing, locomotion level, and caring for others  PERSONAL FACTORS: Time since onset of injury/illness/exacerbation and osteopenia  are also affecting patient's functional outcome.   REHAB POTENTIAL: Good  CLINICAL DECISION MAKING: Evolving/moderate complexity  EVALUATION COMPLEXITY: Moderate   GOALS: Goals reviewed with patient?  Yes  SHORT TERM GOALS: Target date: 12/30  Perform HEP to manage pain through car trip Baseline: Goal status: achieved    LONG TERM GOALS: Target date:  POC date  Able to demo proper hip hinge Baseline:  Goal status: INITIAL  2.  Bil hip abd strength 5/5 Baseline:  Goal status: INITIAL  3.  Progressing appropriate core HEP Baseline:  Goal status: INITIAL  4.  Meet FOTO goal Baseline:  Goal status: INITIAL    PLAN:  PT FREQUENCY: 1-2x/week  PT DURATION: 6 weeks  PLANNED INTERVENTIONS: 97164- PT Re-evaluation, 97110-Therapeutic  exercises, 97530- Therapeutic activity, 97112- Neuromuscular re-education, 97535- Self Care, 46962- Manual therapy, [phone]- Gait training, [phone]- Aquatic Therapy, Patient/Family education, Balance training, Stair training, Taping, Dry Needling, Joint mobilization, Spinal mobilization, Cryotherapy, and Moist heat.  PLAN FOR NEXT SESSION: outcome of heel lift?  Chelsae Zanella C. Ai Sonnenfeld PT, DPT 02/02/23 10:14 AM    Referring diagnosis? M54.16 Treatment diagnosis? (if different than referring diagnosis) M54.59  R29.3 What was this (referring dx) caused by? []  Surgery []  Fall [x]  Ongoing issue []  Arthritis []  Other: ____________  Laterality: []  Rt []  Lt [x]  Both  Check all possible CPT codes:  *CHOOSE 10 OR LESS*    See Planned Interventions listed in the Plan section of the Evaluation.

## 2023-02-19 ENCOUNTER — Encounter (HOSPITAL_BASED_OUTPATIENT_CLINIC_OR_DEPARTMENT_OTHER): Payer: Self-pay | Admitting: Physical Therapy

## 2023-02-19 ENCOUNTER — Ambulatory Visit (HOSPITAL_BASED_OUTPATIENT_CLINIC_OR_DEPARTMENT_OTHER): Payer: Medicare PPO | Attending: Family Medicine | Admitting: Physical Therapy

## 2023-02-19 DIAGNOSIS — R293 Abnormal posture: Secondary | ICD-10-CM | POA: Diagnosis not present

## 2023-02-19 DIAGNOSIS — M5459 Other low back pain: Secondary | ICD-10-CM | POA: Insufficient documentation

## 2023-02-19 DIAGNOSIS — M5442 Lumbago with sciatica, left side: Secondary | ICD-10-CM | POA: Diagnosis not present

## 2023-02-19 DIAGNOSIS — M542 Cervicalgia: Secondary | ICD-10-CM | POA: Insufficient documentation

## 2023-02-19 NOTE — Therapy (Signed)
 OUTPATIENT PHYSICAL THERAPY TREATMENT   Patient Name: Dominique Harrell MRN: [id number] DOB:1955-12-14, 68 y.o., female Today's Date: 02/19/2023  END OF SESSION:  PT End of Session - 02/19/23 1141     Visit Number 5    Number of Visits 13    Date for PT Re-Evaluation 04/13/23    Authorization Type [employer] MCR    PT Start Time 1138    PT Stop Time 1216    PT Time Calculation (min) 38 min    Activity Tolerance Patient tolerated treatment well    Behavior During Therapy WFL for tasks assessed/performed               Past Medical History:  Diagnosis Date   Hyperlipidemia    Insomnia    Migraine    Osteopenia    Past Surgical History:  Procedure Laterality Date   COLONOSCOPY  08/14/2007   w/Dr.Stark   excision of liposarcoma thigh Left    removed at [facility]; drop in Blood pressure per pt   OOPHORECTOMY  1999   left, dermoid cyst   Patient Active Problem List   Diagnosis Date Noted   Lumbar back pain with radiculopathy affecting right lower extremity 04/07/2015   Osteoporosis, 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   General medical examination 03/02/2011   SHOULDER IMPINGEMENT SYNDROME, LEFT 02/15/2010   Hyperlipidemia 06/08/2006   Depression 06/08/2006   MIGRAINE HEADACHE 06/08/2006     REFERRING PROVIDER:  Mahlon Comer BRAVO, MD     REFERRING DIAG:  M54.16 (ICD-10-CM) - Lumbar back pain with radiculopathy affecting right lower extremity     Rationale for Evaluation and Treatment: Rehabilitation  THERAPY DIAG:  Other low back pain  ONSET DATE: approx 12/13/22   SUBJECTIVE:                                                                                                                                                                                           SUBJECTIVE STATEMENT: Would like to work on functional motions and back to volunteering.   PERTINENT HISTORY:  Chronic spinal pain, osteopenia  PAIN:  Are you having pain? Yes: NPRS scale: 0/10 Pain  location: across lower back Pain description: stiff Aggravating factors: sitting too long Relieving factors: move around  PRECAUTIONS:  None  RED FLAGS: None   WEIGHT BEARING RESTRICTIONS:  No  FALLS:  Has patient fallen in last 6 months? No    PATIENT GOALS:  Decrease pain, move easily   OBJECTIVE:  Note: Objective measures were completed at Evaluation unless otherwise noted.  PATIENT SURVEYS:  FOTO 51 1/6: 56   COGNITIVE STATUS: Within functional limits for tasks assessed  SENSATION: Eval: some N/T down legs 12/20: no s/s of nerve in last week  POSTURE:  Eval: Lt post /Rt ant innom rotation.   HAND DOMINANCE:  Left   Body Part #1 Lumbar  PALPATION: Lt post/Rt ant inom rotation at eval  Sidelying hip strength: Lt 28.5 lb  Rt 33.8 lb    TREATMENT:                                                                                                                               Treatment                            1/6:  Prone STM to Lt cervicothoracic paraspinals Standing lawn mower Discussion regarding hip hinge in ADLs, movement trials through shoveling and lifting  Treatment                            12/20:  Ball plank roll out Standing shoulder flexion to 90 post resist, bilat & single Hip hinge head lift 1lb bar Lateral anchored lower body rotation   Treatment                            12/5:  Supine AP mob to Rt ASIS Double layer heel lift added to Lt shoe Trigger Point Dry Needling, Manual Therapy Treatment:  Initial or subsequent education regarding Trigger Point Dry Needling: Subsequent Did patient give consent to treatment with Trigger Point Dry Needling: Yes TPDN with skilled palpation and monitoring followed by STM to the following muscles: bilat glut med, Rt glut max moving lateral to medial, Rt L3-4  Qped rocking- lacking Lt SIJ opening- lacking Lt SIJ mobility Prone PA spring mobs Lt upper and mid sacrum Standing hip  hinge Fluid movement with gait pattern   Treatment                            11/26:  LLE LAD Lt hip lateral distraction Prone rib mobs, STM at thoracolumbar junction Prone alt superman 5s holds- PT providing thoracic pressure in Lt UE/Rt LE lift Ab set with PPT, ball bw knees Rt Scapular distraction    PATIENT EDUCATION:  Education details: Teacher, Music of condition, POC, HEP, exercise form/rationale Person educated: Patient Education method: Explanation, Demonstration, Tactile cues, and Verbal cues Education comprehension: verbalized understanding, returned demonstration, verbal cues required, tactile cues required, and needs further education  HOME EXERCISE PROGRAM: Long axis distraction to Lt LE BID [id number]   ASSESSMENT:  CLINICAL IMPRESSION: Pt has made improvements in ability to perform hip hinge and decrease lumbopelvic guarding during functional acitivites. Her FOTO scores indicate that her ability to perform her ADLs has improved but has not yet returned to PLOF. 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 As long as she does well with the re-integration of heavier activities, we will continue to progress toward long term goals via skilled PT to reduce risk of further injury.     OBJECTIVE IMPAIRMENTS: decreased activity tolerance, decreased mobility, difficulty walking, decreased strength, increased muscle spasms, improper body mechanics, postural dysfunction, and pain.   ACTIVITY LIMITATIONS: carrying, lifting, bending, sitting, standing, squatting, sleeping, stairs, transfers, bed mobility, bathing, dressing, locomotion level, and caring for others  PERSONAL FACTORS: Time since onset of injury/illness/exacerbation and osteopenia  are also affecting patient's functional outcome.   REHAB POTENTIAL: Good  CLINICAL DECISION MAKING: Evolving/moderate complexity  EVALUATION COMPLEXITY: Moderate   GOALS: Goals reviewed with  patient? Yes  SHORT TERM GOALS: Target date: 12/30  Perform HEP to manage pain through car trip Baseline: Goal status: achieved    LONG TERM GOALS: Target date:  POC date  Able to demo proper hip hinge to ankles Baseline: able to reach knee height Goal status: partially met  2.  Bil hip abd strength 5/5 Baseline: <10lb difference Rt to Lt Goal status: achieved  3.  Progressing appropriate core HEP Baseline: requires progression Goal status: ongoing   4.  Meet FOTO goal Baseline: see obj Goal status: ongoing  5. Pt will be able to perform light jogging for short distances to play with grandson  Goal status: NEW  6. Pt will obtain strength and awareness for perform necessary rotational control on the Z axis for return to exercise in golf and bowling.   Goal status: NEW    PLAN:  PT FREQUENCY: 1-2x/week  PT DURATION: 6 weeks  PLANNED INTERVENTIONS: 97164- PT Re-evaluation, 97110-Therapeutic exercises, 97530- Therapeutic activity, 97112- Neuromuscular re-education, 97535- Self Care, 02859- Manual therapy, [phone]- Gait training, [phone]- Aquatic Therapy, Patient/Family education, Balance training, Stair training, Taping, Dry Needling, Joint mobilization, Spinal mobilization, Cryotherapy, and Moist heat.  PLAN FOR NEXT SESSION: return to barn? Kneeling glut/core  Kaedyn Polivka C. Shota Kohrs PT, DPT 02/19/23 12:45 PM    Referring diagnosis? M54.16 Treatment diagnosis? (if different than referring diagnosis) M54.59  R29.3 What was this (referring dx) caused by? []  Surgery []  Fall [x]  Ongoing issue []  Arthritis []  Other: ____________  Laterality: []  Rt []  Lt [x]  Both  Check all possible CPT codes:  *CHOOSE 10 OR LESS*    See Planned Interventions listed in the Plan section of the Evaluation.

## 2023-02-21 DIAGNOSIS — D2272 Melanocytic nevi of left lower limb, including hip: Secondary | ICD-10-CM | POA: Diagnosis not present

## 2023-02-21 DIAGNOSIS — D2271 Melanocytic nevi of right lower limb, including hip: Secondary | ICD-10-CM | POA: Diagnosis not present

## 2023-02-21 DIAGNOSIS — L218 Other seborrheic dermatitis: Secondary | ICD-10-CM | POA: Diagnosis not present

## 2023-02-21 DIAGNOSIS — Z129 Encounter for screening for malignant neoplasm, site unspecified: Secondary | ICD-10-CM | POA: Diagnosis not present

## 2023-02-21 DIAGNOSIS — D225 Melanocytic nevi of trunk: Secondary | ICD-10-CM | POA: Diagnosis not present

## 2023-02-27 ENCOUNTER — Ambulatory Visit: Payer: Medicare PPO | Admitting: Family Medicine

## 2023-02-27 ENCOUNTER — Encounter: Payer: Self-pay | Admitting: Family Medicine

## 2023-02-27 VITALS — BP 118/70 | HR 64 | Temp 98.0°F | Ht 63.5 in | Wt 114.0 lb

## 2023-02-27 DIAGNOSIS — E785 Hyperlipidemia, unspecified: Secondary | ICD-10-CM | POA: Diagnosis not present

## 2023-02-27 DIAGNOSIS — M810 Age-related osteoporosis without current pathological fracture: Secondary | ICD-10-CM | POA: Diagnosis not present

## 2023-02-27 DIAGNOSIS — Z0000 Encounter for general adult medical examination without abnormal findings: Secondary | ICD-10-CM | POA: Diagnosis not present

## 2023-02-27 LAB — CBC WITH DIFFERENTIAL/PLATELET
Basophils Absolute: 0 10*3/uL (ref 0.0–0.1)
Basophils Relative: 0.4 % (ref 0.0–3.0)
Eosinophils Absolute: 0.1 10*3/uL (ref 0.0–0.7)
Eosinophils Relative: 2.1 % (ref 0.0–5.0)
HCT: 43.2 % (ref 36.0–46.0)
Hemoglobin: 14.4 g/dL (ref 12.0–15.0)
Lymphocytes Relative: 33.3 % (ref 12.0–46.0)
Lymphs Abs: 1.9 10*3/uL (ref 0.7–4.0)
MCHC: 33.3 g/dL (ref 30.0–36.0)
MCV: 95.5 fL (ref 78.0–100.0)
Monocytes Absolute: 0.3 10*3/uL (ref 0.1–1.0)
Monocytes Relative: 5.6 % (ref 3.0–12.0)
Neutro Abs: 3.4 10*3/uL (ref 1.4–7.7)
Neutrophils Relative %: 58.6 % (ref 43.0–77.0)
Platelets: 195 10*3/uL (ref 150.0–400.0)
RBC: 4.52 Mil/uL (ref 3.87–5.11)
RDW: 12.8 % (ref 11.5–15.5)
WBC: 5.7 10*3/uL (ref 4.0–10.5)

## 2023-02-27 LAB — HEPATIC FUNCTION PANEL
ALT: 26 U/L (ref 0–35)
AST: 23 U/L (ref 0–37)
Albumin: 4.9 g/dL (ref 3.5–5.2)
Alkaline Phosphatase: 41 U/L (ref 39–117)
Bilirubin, Direct: 0.2 mg/dL (ref 0.0–0.3)
Total Bilirubin: 0.8 mg/dL (ref 0.2–1.2)
Total Protein: 7.2 g/dL (ref 6.0–8.3)

## 2023-02-27 LAB — BASIC METABOLIC PANEL
BUN: 14 mg/dL (ref 6–23)
CO2: 30 meq/L (ref 19–32)
Calcium: 9.4 mg/dL (ref 8.4–10.5)
Chloride: 105 meq/L (ref 96–112)
Creatinine, Ser: 0.69 mg/dL (ref 0.40–1.20)
GFR: 89.44 mL/min (ref >60.00)
Glucose, Bld: 91 mg/dL (ref 70–99)
Potassium: 4.4 meq/L (ref 3.5–5.1)
Sodium: 139 meq/L (ref 135–145)

## 2023-02-27 LAB — LIPID PANEL
Cholesterol: 177 mg/dL (ref 0–200)
HDL: 68.1 mg/dL (ref >39.00)
LDL Cholesterol: 95 mg/dL (ref 0–99)
NonHDL: 108.55
Total CHOL/HDL Ratio: 3
Triglycerides: 68 mg/dL (ref 0.0–149.0)
VLDL: 13.6 mg/dL (ref 0.0–40.0)

## 2023-02-27 LAB — TSH: TSH: 1.73 u[IU]/mL (ref 0.35–5.50)

## 2023-02-27 LAB — VITAMIN D 25 HYDROXY (VIT D DEFICIENCY, FRACTURES): VITD: 42.24 ng/mL (ref 30.00–100.00)

## 2023-02-27 NOTE — Patient Instructions (Signed)
 Follow up in 6 months to recheck cholesterol We'll notify you of your lab results and make any changes if needed Keep up the good work on healthy diet and regular exercise- you look great! Call with any questions or concerns Stay Safe!   Stay Healthy! Happy Birthday!!!

## 2023-02-27 NOTE — Progress Notes (Signed)
   Subjective:    Patient ID: Dominique Harrell, female    DOB: 01/18/1956, 68 y.o.   MRN: [id number]  HPI CPE- UTD on mammo, colonoscopy, Tdap, PNA, flu.  Patient Care Team    Relationship Specialty Notifications Start End  Mahlon Comer BRAVO, MD PCP - General   01/26/10   Rutherford Gain, MD Consulting Physician Obstetrics and Gynecology  11/02/14      Health Maintenance  Topic Date Due   Medicare Annual Wellness (AWV)  02/02/2022   DEXA SCAN  04/06/2022   COVID-19 Vaccine (8 - 2024-25 season) 02/13/2023   MAMMOGRAM  04/25/2023   Colonoscopy  03/05/2028   DTaP/Tdap/Td (4 - Td or Tdap) 03/11/2029   Pneumonia Vaccine 71+ Years old  Completed   INFLUENZA VACCINE  Completed   Hepatitis C Screening  Completed   Zoster Vaccines- Shingrix   Completed   HPV VACCINES  Aged Out      Review of Systems Patient reports no vision/ hearing changes, adenopathy,fever, weight change,  persistant/recurrent hoarseness , swallowing issues, chest pain, palpitations, edema, persistant/recurrent cough, hemoptysis, dyspnea (rest/exertional/paroxysmal nocturnal), gastrointestinal bleeding (melena, rectal bleeding), abdominal pain, significant heartburn, bowel changes, GU symptoms (dysuria, hematuria, incontinence), Gyn symptoms (abnormal  bleeding, pain),  syncope, focal weakness, memory loss, numbness & tingling, skin/hair/nail changes, abnormal bruising or bleeding, anxiety, or depression.     Objective:   Physical Exam General Appearance:    Alert, cooperative, no distress, appears stated age  Head:    Normocephalic, without obvious abnormality, atraumatic  Eyes:    PERRL, conjunctiva/corneas clear, EOM's intact both eyes  Ears:    Normal TM's and external ear canals, both ears  Nose:   Nares normal, septum midline, mucosa normal, no drainage    or sinus tenderness  Throat:   Lips, mucosa, and tongue normal; teeth and gums normal  Neck:   Supple, symmetrical, trachea midline, no adenopathy;     Thyroid : no enlargement/tenderness/nodules  Back:     Symmetric, no curvature, ROM normal, no CVA tenderness  Lungs:     Clear to auscultation bilaterally, respirations unlabored  Chest Wall:    No tenderness or deformity   Heart:    Regular rate and rhythm, S1 and S2 normal, no murmur, rub   or gallop  Breast Exam:    Deferred to GYN  Abdomen:     Soft, non-tender, bowel sounds active all four quadrants,    no masses, no organomegaly  Genitalia:    Deferred to GYN  Rectal:    Extremities:   Extremities normal, atraumatic, no cyanosis or edema  Pulses:   2+ and symmetric all extremities  Skin:   Skin color, texture, turgor normal, no rashes or lesions  Lymph nodes:   Cervical, supraclavicular, and axillary nodes normal  Neurologic:   CNII-XII intact, normal strength, sensation and reflexes    throughout          Assessment & Plan:

## 2023-02-28 ENCOUNTER — Telehealth: Payer: Self-pay

## 2023-02-28 NOTE — Telephone Encounter (Signed)
-----   Message from Laymon Priest sent at 02/28/2023  3:57 PM EST ----- Labs look great!  No changes at this time

## 2023-02-28 NOTE — Telephone Encounter (Signed)
 Pt has been notified.

## 2023-03-01 ENCOUNTER — Encounter (HOSPITAL_BASED_OUTPATIENT_CLINIC_OR_DEPARTMENT_OTHER): Payer: Self-pay | Admitting: Physical Therapy

## 2023-03-03 ENCOUNTER — Ambulatory Visit (HOSPITAL_BASED_OUTPATIENT_CLINIC_OR_DEPARTMENT_OTHER): Payer: Medicare PPO | Admitting: Physical Therapy

## 2023-03-03 ENCOUNTER — Encounter (HOSPITAL_BASED_OUTPATIENT_CLINIC_OR_DEPARTMENT_OTHER): Payer: Self-pay | Admitting: Physical Therapy

## 2023-03-03 DIAGNOSIS — M5459 Other low back pain: Secondary | ICD-10-CM | POA: Diagnosis not present

## 2023-03-03 DIAGNOSIS — M5442 Lumbago with sciatica, left side: Secondary | ICD-10-CM | POA: Diagnosis not present

## 2023-03-03 DIAGNOSIS — M542 Cervicalgia: Secondary | ICD-10-CM

## 2023-03-03 DIAGNOSIS — R293 Abnormal posture: Secondary | ICD-10-CM

## 2023-03-03 NOTE — Therapy (Signed)
OUTPATIENT PHYSICAL THERAPY TREATMENT   Patient Name: Dominique Harrell MRN: [id number] DOB:06-12-1955, 68 y.o., female Today's Date: 03/03/2023  END OF SESSION:  PT End of Session - 03/03/23 0830     Visit Number 6    Number of Visits 13    Date for PT Re-Evaluation 04/13/23    Authorization Type [employer] MCR    PT Start Time 0829    PT Stop Time 0859    PT Time Calculation (min) 30 min    Activity Tolerance Patient tolerated treatment well    Behavior During Therapy [facility] for tasks assessed/performed               Past Medical History:  Diagnosis Date   Hyperlipidemia    Insomnia    Migraine    Osteopenia    Past Surgical History:  Procedure Laterality Date   COLONOSCOPY  08/14/2007   w/Dr.Stark   excision of liposarcoma thigh Left    removed at [facility]; drop in Blood pressure per pt   OOPHORECTOMY  1999   left, dermoid cyst   Patient Active Problem List   Diagnosis Date Noted   Lumbar back pain with radiculopathy affecting right lower extremity 04/07/2015   Osteoporosis, 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   General medical examination 03/02/2011   SHOULDER IMPINGEMENT SYNDROME, LEFT 02/15/2010   Hyperlipidemia 06/08/2006   Depression 06/08/2006   MIGRAINE HEADACHE 06/08/2006     REFERRING PROVIDER:  Sheliah Hatch, MD     REFERRING DIAG:  M54.16 (ICD-10-CM) - Lumbar back pain with radiculopathy affecting right lower extremity     Rationale for Evaluation and Treatment: Rehabilitation  THERAPY DIAG:  Other low back pain  Abnormal posture  Cervicalgia  ONSET DATE: approx 12/13/22   SUBJECTIVE:                                                                                                                                                                                           SUBJECTIVE STATEMENT: Back to barn on Thursday. Still feel it across mid- back but nothing went out.   PERTINENT HISTORY:  Chronic spinal pain, osteopenia  PAIN:   Are you having pain? Yes: NPRS scale: 0/10 Pain location: across lower back Pain description: stiff Aggravating factors: sitting too long Relieving factors: move around  PRECAUTIONS:  None  RED FLAGS: None   WEIGHT BEARING RESTRICTIONS:  No  FALLS:  Has patient fallen in last 6 months? No    PATIENT GOALS:  Decrease pain, move easily   OBJECTIVE:  Note: Objective measures were completed at Evaluation unless otherwise noted.  PATIENT SURVEYS:  FOTO 51 1/6: 56  COGNITIVE STATUS: Within functional limits for tasks assessed   SENSATION: Eval: some N/T down legs 12/20: no s/s of nerve in last week  POSTURE:  Eval: Lt post /Rt ant innom rotation.   HAND DOMINANCE:  Left   Body Part #1 Lumbar  PALPATION: Lt post/Rt ant inom rotation at eval  Sidelying hip strength: Lt 28.5 lb  Rt 33.8 lb    TREATMENT:                                                                                                                               Treatment                            1/18:  Trigger Point Dry Needling  Subsequent Treatment: Instructions provided previously at initial dry needling treatment.   Patient Verbal Consent Given: Yes Education Handout Provided: Previously Provided Muscles Treated: bilateral upper trap & levator Electrical Stimulation Performed: No Treatment Response/Outcome: twitch with decreased spasm  STM Lt thoracic paraspinals, rhomboids Rt first rib depression   Treatment                            1/6:  Prone STM to Lt cervicothoracic paraspinals Standing lawn mower Discussion regarding hip hinge in ADLs, movement trials through shoveling and lifting  Treatment                            12/20:  Ball plank roll out Standing shoulder flexion to 90 post resist, bilat & single Hip hinge head lift 1lb bar Lateral anchored lower body rotation      PATIENT EDUCATION:  Education details: Anatomy of condition, POC, HEP, exercise  form/rationale Person educated: Patient Education method: Explanation, Demonstration, Tactile cues, and Verbal cues Education comprehension: verbalized understanding, returned demonstration, verbal cues required, tactile cues required, and needs further education  HOME EXERCISE PROGRAM: Long axis distraction to Lt LE BID [id number]   ASSESSMENT:  CLINICAL IMPRESSION: Decreased spasm across shoulders and down Lt thoracic spine following treatment. 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 Will continue PT weekly to every 2 weeks to monitor and maintain level of function and reduce risk of reinjury.     OBJECTIVE IMPAIRMENTS: decreased activity tolerance, decreased mobility, difficulty walking, decreased strength, increased muscle spasms, improper body mechanics, postural dysfunction, and pain.   ACTIVITY LIMITATIONS: carrying, lifting, bending, sitting, standing, squatting, sleeping, stairs, transfers, bed mobility, bathing, dressing, locomotion level, and caring for others  PERSONAL FACTORS: Time since onset of injury/illness/exacerbation and osteopenia  are also affecting patient's functional outcome.   REHAB POTENTIAL: Good  CLINICAL DECISION MAKING: Evolving/moderate complexity  EVALUATION COMPLEXITY: Moderate   GOALS: Goals reviewed with patient? Yes  SHORT TERM GOALS: Target date: 12/30  Perform HEP to  manage pain through car trip Baseline: Goal status: achieved    LONG TERM GOALS: Target date:  POC date  Able to demo proper hip hinge to ankles Baseline: able to reach knee height Goal status: partially met  2.  Bil hip abd strength 5/5 Baseline: <10lb difference Rt to Lt Goal status: achieved  3.  Progressing appropriate core HEP Baseline: requires progression Goal status: ongoing   4.  Meet FOTO goal Baseline: see obj Goal status: ongoing  5. Pt will be able to perform light jogging for short  distances to play with grandson  Goal status: NEW  6. Pt will obtain strength and awareness for perform necessary rotational control on the Z axis for return to exercise in golf and bowling.   Goal status: NEW    PLAN:  PT FREQUENCY: 1-2x/week  PT DURATION: 6 weeks  PLANNED INTERVENTIONS: 97164- PT Re-evaluation, 97110-Therapeutic exercises, 97530- Therapeutic activity, 97112- Neuromuscular re-education, 97535- Self Care, 21308- Manual therapy, [phone]- Gait training, [phone]- Aquatic Therapy, Patient/Family education, Balance training, Stair training, Taping, Dry Needling, Joint mobilization, Spinal mobilization, Cryotherapy, and Moist heat.  PLAN FOR NEXT SESSION: review body mechanics, DN PRN  Naliah Eddington C. Raisa Ditto PT, DPT 03/03/23 9:03 AM    Referring diagnosis? M54.16 Treatment diagnosis? (if different than referring diagnosis) M54.59  R29.3 What was this (referring dx) caused by? []  Surgery []  Fall [x]  Ongoing issue []  Arthritis []  Other: ____________  Laterality: []  Rt []  Lt [x]  Both  Check all possible CPT codes:  *CHOOSE 10 OR LESS*    See Planned Interventions listed in the Plan section of the Evaluation.

## 2023-03-06 ENCOUNTER — Encounter (HOSPITAL_BASED_OUTPATIENT_CLINIC_OR_DEPARTMENT_OTHER): Payer: Self-pay | Admitting: Physical Therapy

## 2023-03-06 ENCOUNTER — Encounter: Payer: Self-pay | Admitting: Family Medicine

## 2023-03-06 ENCOUNTER — Ambulatory Visit (HOSPITAL_BASED_OUTPATIENT_CLINIC_OR_DEPARTMENT_OTHER)
Admission: RE | Admit: 2023-03-06 | Discharge: 2023-03-06 | Disposition: A | Discharge status: Home / Self Care | Payer: Medicare PPO | Source: Ambulatory Visit | Attending: Family Medicine | Admitting: Family Medicine

## 2023-03-06 DIAGNOSIS — M810 Age-related osteoporosis without current pathological fracture: Secondary | ICD-10-CM | POA: Diagnosis not present

## 2023-03-06 DIAGNOSIS — Z780 Asymptomatic menopausal state: Secondary | ICD-10-CM | POA: Diagnosis not present

## 2023-03-06 NOTE — Assessment & Plan Note (Signed)
Pt's PE WNL.  UTD on mammo, colonoscopy, immunizations.  Check labs.  Anticipatory guidance provided.

## 2023-03-06 NOTE — Assessment & Plan Note (Signed)
Due for repeat DEXA- ordered.

## 2023-03-09 ENCOUNTER — Telehealth: Payer: Self-pay

## 2023-03-09 NOTE — Telephone Encounter (Signed)
Per recent DEXA there was question about if patient is taking Fosamax currently, she is not currently taking Fosamax but would like to start given her recent scan, okay to send in?

## 2023-03-09 NOTE — Telephone Encounter (Signed)
Copied from CRM [id number]. Topic: Clinical - Medication Question >> Mar 09, 2023  2:53 PM Almira Coaster wrote: Reason for CRM: Patient is calling and would like to start  Fosamax 70mg .

## 2023-03-11 MED ORDER — ALENDRONATE SODIUM 70 MG PO TABS: 70.0000 mg | ORAL_TABLET | ORAL | 3 refills | Status: DC

## 2023-03-11 NOTE — Telephone Encounter (Signed)
Prescription for Fosamax sent to pharmacy

## 2023-03-11 NOTE — Addendum Note (Signed)
Addended by: Sheliah Hatch on: 03/11/2023 04:39 PM   Modules accepted: Orders

## 2023-03-14 ENCOUNTER — Encounter (HOSPITAL_BASED_OUTPATIENT_CLINIC_OR_DEPARTMENT_OTHER): Payer: Self-pay | Admitting: Physical Therapy

## 2023-03-14 ENCOUNTER — Ambulatory Visit (HOSPITAL_BASED_OUTPATIENT_CLINIC_OR_DEPARTMENT_OTHER): Payer: Medicare PPO | Admitting: Physical Therapy

## 2023-03-14 DIAGNOSIS — M542 Cervicalgia: Secondary | ICD-10-CM | POA: Diagnosis not present

## 2023-03-14 DIAGNOSIS — M5442 Lumbago with sciatica, left side: Secondary | ICD-10-CM | POA: Diagnosis not present

## 2023-03-14 DIAGNOSIS — M5459 Other low back pain: Secondary | ICD-10-CM

## 2023-03-14 DIAGNOSIS — R293 Abnormal posture: Secondary | ICD-10-CM | POA: Diagnosis not present

## 2023-03-14 NOTE — Therapy (Signed)
OUTPATIENT PHYSICAL THERAPY TREATMENT   Patient Name: Dominique Harrell MRN: [id number] DOB:12/08/1955, 68 y.o., female Today's Date: 03/14/2023  END OF SESSION:  PT End of Session - 03/14/23 1103     Visit Number 7    Number of Visits 13    Date for PT Re-Evaluation 04/13/23    Authorization Type [employer] MCR    PT Start Time 1100    PT Stop Time 1143    PT Time Calculation (min) 43 min    Activity Tolerance Patient tolerated treatment well    Behavior During Therapy WFL for tasks assessed/performed                Past Medical History:  Diagnosis Date   Hyperlipidemia    Insomnia    Migraine    Osteopenia    Past Surgical History:  Procedure Laterality Date   COLONOSCOPY  08/14/2007   w/Dr.Stark   excision of liposarcoma thigh Left    removed at [facility]; drop in Blood pressure per pt   OOPHORECTOMY  1999   left, dermoid cyst   Patient Active Problem List   Diagnosis Date Noted   Lumbar back pain with radiculopathy affecting right lower extremity 04/07/2015   Osteoporosis, 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   General medical examination 03/02/2011   SHOULDER IMPINGEMENT SYNDROME, LEFT 02/15/2010   Hyperlipidemia 06/08/2006   Depression 06/08/2006   MIGRAINE HEADACHE 06/08/2006     REFERRING PROVIDER:  Sheliah Hatch, MD     REFERRING DIAG:  M54.16 (ICD-10-CM) - Lumbar back pain with radiculopathy affecting right lower extremity     Rationale for Evaluation and Treatment: Rehabilitation  THERAPY DIAG:  Other low back pain  Abnormal posture  Cervicalgia  Acute left-sided low back pain with left-sided sciatica  ONSET DATE: approx 12/13/22   SUBJECTIVE:                                                                                                                                                                                           SUBJECTIVE STATEMENT: Upper traps still beingn annoying. OP score shows L3 and Rt femur were poor score.  Putting me back of fosamax.   PERTINENT HISTORY:  Chronic spinal pain, osteopenia  PAIN:  Are you having pain? Yes: NPRS scale: 0/10 Pain location: across lower back Pain description: stiff Aggravating factors: sitting too long Relieving factors: move around  PRECAUTIONS:  None  RED FLAGS: None   WEIGHT BEARING RESTRICTIONS:  No  FALLS:  Has patient fallen in last 6 months? No    PATIENT GOALS:  Decrease pain, move easily   OBJECTIVE:  Note: Objective measures were  completed at Evaluation unless otherwise noted.  PATIENT SURVEYS:  FOTO 51 1/6: 56   COGNITIVE STATUS: Within functional limits for tasks assessed   SENSATION: Eval: some N/T down legs 12/20: no s/s of nerve in last week  POSTURE:  Eval: Lt post /Rt ant innom rotation.   HAND DOMINANCE:  Left   Body Part #1 Lumbar  PALPATION: Lt post/Rt ant inom rotation at eval  Sidelying hip strength: Lt 28.5 lb  Rt 33.8 lb    TREATMENT:                                                                                                                               Treatment                            1/29:  Cervical SB Lt 30, Rt 24; Rotation 48/48 Trigger Point Dry Needling  Subsequent Treatment: Instructions reviewed, if requested by the patient, prior to subsequent dry needling treatment.   Patient Verbal Consent Given: Yes Education Handout Provided: Previously Provided Muscles Treated: Lt upper trap Electrical Stimulation Performed: No Treatment Response/Outcome: twitch with decreased tension  Lateral step up with glut set and balance Diver Wall sit + UE T Leg press [facility]    Treatment                            1/18:  Trigger Point Dry Needling  Subsequent Treatment: Instructions provided previously at initial dry needling treatment.   Patient Verbal Consent Given: Yes Education Handout Provided: Previously Provided Muscles Treated: bilateral upper trap &  levator Electrical Stimulation Performed: No Treatment Response/Outcome: twitch with decreased spasm  STM Lt thoracic paraspinals, rhomboids Rt first rib depression   Treatment                            1/6:  Prone STM to Lt cervicothoracic paraspinals Standing lawn mower Discussion regarding hip hinge in ADLs, movement trials through shoveling and lifting  Treatment                            12/20:  Ball plank roll out Standing shoulder flexion to 90 post resist, bilat & single Hip hinge head lift 1lb bar Lateral anchored lower body rotation      PATIENT EDUCATION:  Education details: Anatomy of condition, POC, HEP, exercise form/rationale Person educated: Patient Education method: Explanation, Demonstration, Tactile cues, and Verbal cues Education comprehension: verbalized understanding, returned demonstration, verbal cues required, tactile cues required, and needs further education  HOME EXERCISE PROGRAM: Long axis distraction to Lt LE BID [id number]   ASSESSMENT:  CLINICAL IMPRESSION: Planning to return to the [facility]. OP scores indicative of progressive decrease in bone density. Able to improve ROM with DN  and decrease tension. Will cont to benefit from PT to transition to gym & meet long term goals.     OBJECTIVE IMPAIRMENTS: decreased activity tolerance, decreased mobility, difficulty walking, decreased strength, increased muscle spasms, improper body mechanics, postural dysfunction, and pain.   ACTIVITY LIMITATIONS: carrying, lifting, bending, sitting, standing, squatting, sleeping, stairs, transfers, bed mobility, bathing, dressing, locomotion level, and caring for others  PERSONAL FACTORS: Time since onset of injury/illness/exacerbation and osteopenia  are also affecting patient's functional outcome.   REHAB POTENTIAL: Good  CLINICAL DECISION MAKING: Evolving/moderate complexity  EVALUATION COMPLEXITY: Moderate   GOALS: Goals reviewed with patient?  Yes  SHORT TERM GOALS: Target date: 12/30  Perform HEP to manage pain through car trip Baseline: Goal status: achieved    LONG TERM GOALS: Target date:  POC date  Able to demo proper hip hinge to ankles Baseline: able to reach knee height Goal status: partially met  2.  Bil hip abd strength 5/5 Baseline: <10lb difference Rt to Lt Goal status: achieved  3.  Progressing appropriate core HEP Baseline: requires progression Goal status: ongoing   4.  Meet FOTO goal Baseline: see obj Goal status: ongoing  5. Pt will be able to perform light jogging for short distances to play with grandson  Goal status: NEW  6. Pt will obtain strength and awareness for perform necessary rotational control on the Z axis for return to exercise in golf and bowling.   Goal status: NEW    PLAN:  PT FREQUENCY: 1-2x/week  PT DURATION: 6 weeks  PLANNED INTERVENTIONS: 97164- PT Re-evaluation, 97110-Therapeutic exercises, 97530- Therapeutic activity, 97112- Neuromuscular re-education, 97535- Self Care, 28413- Manual therapy, [phone]- Gait training, [phone]- Aquatic Therapy, Patient/Family education, Balance training, Stair training, Taping, Dry Needling, Joint mobilization, Spinal mobilization, Cryotherapy, and Moist heat.  PLAN FOR NEXT SESSION: review body mechanics, DN PRN  Cyanna Neace C. Keanon Bevins PT, DPT 03/14/23 11:45 AM    Referring diagnosis? M54.16 Treatment diagnosis? (if different than referring diagnosis) M54.59  R29.3 What was this (referring dx) caused by? []  Surgery []  Fall [x]  Ongoing issue []  Arthritis []  Other: ____________  Laterality: []  Rt []  Lt [x]  Both  Check all possible CPT codes:  *CHOOSE 10 OR LESS*    See Planned Interventions listed in the Plan section of the Evaluation.

## 2023-03-20 ENCOUNTER — Encounter (HOSPITAL_BASED_OUTPATIENT_CLINIC_OR_DEPARTMENT_OTHER): Payer: Self-pay | Admitting: Physical Therapy

## 2023-03-20 ENCOUNTER — Ambulatory Visit (HOSPITAL_BASED_OUTPATIENT_CLINIC_OR_DEPARTMENT_OTHER): Payer: Medicare PPO | Attending: Family Medicine | Admitting: Physical Therapy

## 2023-03-20 DIAGNOSIS — M542 Cervicalgia: Secondary | ICD-10-CM | POA: Diagnosis not present

## 2023-03-20 DIAGNOSIS — R293 Abnormal posture: Secondary | ICD-10-CM | POA: Insufficient documentation

## 2023-03-20 DIAGNOSIS — M5459 Other low back pain: Secondary | ICD-10-CM | POA: Diagnosis not present

## 2023-03-20 DIAGNOSIS — M5442 Lumbago with sciatica, left side: Secondary | ICD-10-CM | POA: Insufficient documentation

## 2023-03-20 NOTE — Therapy (Signed)
 OUTPATIENT PHYSICAL THERAPY TREATMENT   Patient Name: Dominique Harrell MRN: [id number] DOB:[birth year], 68 y.o., female Today's Date: 03/20/2023  END OF SESSION:  PT End of Session - 03/20/23 1148     Visit Number 8    Number of Visits 13    Date for PT Re-Evaluation 04/13/23    Authorization Type [employer] MCR    PT Start Time 1148    PT Stop Time 1228    PT Time Calculation (min) 40 min    Activity Tolerance Patient tolerated treatment well    Behavior During Therapy WFL for tasks assessed/performed                Past Medical History:  Diagnosis Date   Hyperlipidemia    Insomnia    Migraine    Osteopenia    Past Surgical History:  Procedure Laterality Date   COLONOSCOPY  08/14/2007   w/Dr.Stark   excision of liposarcoma thigh Left    removed at [facility]; drop in Blood pressure per pt   OOPHORECTOMY  1999   left, dermoid cyst   Patient Active Problem List   Diagnosis Date Noted   Lumbar back pain with radiculopathy affecting right lower extremity 04/07/2015   Osteoporosis, 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   General medical examination 03/02/2011   SHOULDER IMPINGEMENT SYNDROME, LEFT 02/15/2010   Hyperlipidemia 06/08/2006   Depression 06/08/2006   MIGRAINE HEADACHE 06/08/2006     REFERRING PROVIDER:  Mahlon Comer BRAVO, MD     REFERRING DIAG:  M54.16 (ICD-10-CM) - Lumbar back pain with radiculopathy affecting right lower extremity     Rationale for Evaluation and Treatment: Rehabilitation  THERAPY DIAG:  Other low back pain  Abnormal posture  Cervicalgia  ONSET DATE: approx 12/13/22   SUBJECTIVE:                                                                                                                                                                                           SUBJECTIVE STATEMENT: Working on balance exercises. Neck is still stiff but better, doing more and more. Thoracic pain is stiff but it is not staying constantly anymore.  1/week at horse barn right now.    PERTINENT HISTORY:  Chronic spinal pain, osteopenia  PAIN:  Are you having pain? Yes: NPRS scale: 0/10 Pain location: across lower back Pain description: stiff Aggravating factors: sitting too long Relieving factors: move around  PRECAUTIONS:  None  RED FLAGS: None   WEIGHT BEARING RESTRICTIONS:  No  FALLS:  Has patient fallen in last 6 months? No    PATIENT GOALS:  Decrease pain, move easily   OBJECTIVE:  Note:  Objective measures were completed at Evaluation unless otherwise noted.  PATIENT SURVEYS:  FOTO 51 1/6: 56   COGNITIVE STATUS: Within functional limits for tasks assessed   SENSATION: Eval: some N/T down legs 12/20: no s/s of nerve in last week  POSTURE:  Eval: Lt post /Rt ant innom rotation.   HAND DOMINANCE:  Left   Body Part #1 Lumbar  PALPATION: Lt post/Rt ant inom rotation at eval  Sidelying hip strength: Lt 28.5 lb  Rt 33.8 lb    TREATMENT:                                                                                                                               Treatment                            2/4:  Trigger Point Dry Needling  Subsequent Treatment: Instructions provided previously at initial dry needling treatment.   Patient Verbal Consent Given: Yes Education Handout Provided: Previously Provided Muscles Treated: bilateral upper traps Electrical Stimulation Performed: No Treatment Response/Outcome: twitch with decreased spasm/concordant tightness  Prone rib mobs on Rt side Supine bil first rib depression with cervical sidebend Distraction + rotation Suboccipital release  Cable machines in gym- row (neutral and wide tandem stance); straight arm press down; single arm scaption pull down   Treatment                            1/29:  Cervical SB Lt 30, Rt 24; Rotation 48/48 Trigger Point Dry Needling  Subsequent Treatment: Instructions reviewed, if requested by the patient,  prior to subsequent dry needling treatment.   Patient Verbal Consent Given: Yes Education Handout Provided: Previously Provided Muscles Treated: Lt upper trap Electrical Stimulation Performed: No Treatment Response/Outcome: twitch with decreased tension  Lateral step up with glut set and balance Diver Wall sit + UE T Leg press [facility] [employer]    Treatment                            1/18:  Trigger Point Dry Needling  Subsequent Treatment: Instructions provided previously at initial dry needling treatment.   Patient Verbal Consent Given: Yes Education Handout Provided: Previously Provided Muscles Treated: bilateral upper trap & levator Electrical Stimulation Performed: No Treatment Response/Outcome: twitch with decreased spasm  STM Lt thoracic paraspinals, rhomboids Rt first rib depression   Treatment                            1/6:  Prone STM to Lt cervicothoracic paraspinals Standing lawn mower Discussion regarding hip hinge in ADLs, movement trials through shoveling and lifting  Treatment  12/20:  Ball plank roll out Standing shoulder flexion to 90 post resist, bilat & single Hip hinge head lift 1lb bar Lateral anchored lower body rotation      PATIENT EDUCATION:  Education details: Anatomy of condition, POC, HEP, exercise form/rationale Person educated: Patient Education method: Explanation, Demonstration, Tactile cues, and Verbal cues Education comprehension: verbalized understanding, returned demonstration, verbal cues required, tactile cues required, and needs further education  HOME EXERCISE PROGRAM: Long axis distraction to Lt LE BID [id number]   ASSESSMENT:  CLINICAL IMPRESSION: Added cable exercises to workout program to utilize in gym. Rt post-inf rib cage with limited mobility.     OBJECTIVE IMPAIRMENTS: decreased activity tolerance, decreased mobility, difficulty walking, decreased strength, increased  muscle spasms, improper body mechanics, postural dysfunction, and pain.   ACTIVITY LIMITATIONS: carrying, lifting, bending, sitting, standing, squatting, sleeping, stairs, transfers, bed mobility, bathing, dressing, locomotion level, and caring for others  PERSONAL FACTORS: Time since onset of injury/illness/exacerbation and osteopenia  are also affecting patient's functional outcome.   REHAB POTENTIAL: Good  CLINICAL DECISION MAKING: Evolving/moderate complexity  EVALUATION COMPLEXITY: Moderate   GOALS: Goals reviewed with patient? Yes  SHORT TERM GOALS: Target date: 12/30  Perform HEP to manage pain through car trip Baseline: Goal status: achieved    LONG TERM GOALS: Target date:  POC date  Able to demo proper hip hinge to ankles Baseline: able to reach knee height Goal status: partially met  2.  Bil hip abd strength 5/5 Baseline: <10lb difference Rt to Lt Goal status: achieved  3.  Progressing appropriate core HEP Baseline: requires progression Goal status: ongoing   4.  Meet FOTO goal Baseline: see obj Goal status: ongoing  5. Pt will be able to perform light jogging for short distances to play with grandson  Goal status: NEW  6. Pt will obtain strength and awareness for perform necessary rotational control on the Z axis for return to exercise in golf and bowling.   Goal status: NEW    PLAN:  PT FREQUENCY: 1-2x/week  PT DURATION: 6 weeks  PLANNED INTERVENTIONS: 97164- PT Re-evaluation, 97110-Therapeutic exercises, 97530- Therapeutic activity, 97112- Neuromuscular re-education, 97535- Self Care, [id number]- Manual therapy, [phone]- Gait training, [phone]- Aquatic Therapy, Patient/Family education, Balance training, Stair training, Taping, Dry Needling, Joint mobilization, Spinal mobilization, Cryotherapy, and Moist heat.  PLAN FOR NEXT SESSION: review body mechanics, DN PRN  Keyshla Tunison C. Jamy Whyte PT, DPT 03/20/23 1:24 PM    Referring diagnosis?  M54.16 Treatment diagnosis? (if different than referring diagnosis) M54.59  R29.3 What was this (referring dx) caused by? []  Surgery []  Fall [x]  Ongoing issue []  Arthritis []  Other: ____________  Laterality: []  Rt []  Lt [x]  Both  Check all possible CPT codes:  *CHOOSE 10 OR LESS*    See Planned Interventions listed in the Plan section of the Evaluation.

## 2023-04-03 ENCOUNTER — Encounter (HOSPITAL_BASED_OUTPATIENT_CLINIC_OR_DEPARTMENT_OTHER): Payer: Self-pay | Admitting: Physical Therapy

## 2023-04-03 ENCOUNTER — Ambulatory Visit (HOSPITAL_BASED_OUTPATIENT_CLINIC_OR_DEPARTMENT_OTHER): Payer: Medicare PPO | Admitting: Physical Therapy

## 2023-04-03 DIAGNOSIS — R293 Abnormal posture: Secondary | ICD-10-CM

## 2023-04-03 DIAGNOSIS — M5459 Other low back pain: Secondary | ICD-10-CM | POA: Diagnosis not present

## 2023-04-03 DIAGNOSIS — M542 Cervicalgia: Secondary | ICD-10-CM | POA: Diagnosis not present

## 2023-04-03 DIAGNOSIS — M5442 Lumbago with sciatica, left side: Secondary | ICD-10-CM

## 2023-04-03 NOTE — Therapy (Signed)
 OUTPATIENT PHYSICAL THERAPY TREATMENT   Patient Name: Dominique Harrell MRN: [id number] DOB:03-05-1955, 68 y.o., female Today's Date: 04/03/2023  END OF SESSION:  PT End of Session - 04/03/23 1147     Visit Number 9    Number of Visits 13    Date for PT Re-Evaluation 04/13/23    Authorization Type [employer] MCR    PT Start Time 1146    PT Stop Time 1215    PT Time Calculation (min) 29 min    Activity Tolerance Patient tolerated treatment well    Behavior During Therapy WFL for tasks assessed/performed                Past Medical History:  Diagnosis Date   Hyperlipidemia    Insomnia    Migraine    Osteopenia    Past Surgical History:  Procedure Laterality Date   COLONOSCOPY  08/14/2007   w/Dr.Stark   excision of liposarcoma thigh Left    removed at [facility]; drop in Blood pressure per pt   OOPHORECTOMY  1999   left, dermoid cyst   Patient Active Problem List   Diagnosis Date Noted   Lumbar back pain with radiculopathy affecting right lower extremity 04/07/2015   Osteoporosis, 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   General medical examination 03/02/2011   SHOULDER IMPINGEMENT SYNDROME, LEFT 02/15/2010   Hyperlipidemia 06/08/2006   Depression 06/08/2006   MIGRAINE HEADACHE 06/08/2006     REFERRING PROVIDER:  Sheliah Hatch, MD     REFERRING DIAG:  M54.16 (ICD-10-CM) - Lumbar back pain with radiculopathy affecting right lower extremity     Rationale for Evaluation and Treatment: Rehabilitation  THERAPY DIAG:  Other low back pain  Abnormal posture  Cervicalgia  Acute left-sided low back pain with left-sided sciatica  ONSET DATE: approx 12/13/22   SUBJECTIVE:                                                                                                                                                                                           SUBJECTIVE STATEMENT: Had to shorten time in wall sit for discomfort in SIJs. Has a 5 hr car ride this  weekend. I think I am going to get TrX straps.    PERTINENT HISTORY:  Chronic spinal pain, osteopenia  PAIN:  Are you having pain? Yes: NPRS scale: 0/10 Pain location: across lower back Pain description: stiff Aggravating factors: sitting too long Relieving factors: move around  PRECAUTIONS:  None  RED FLAGS: None   WEIGHT BEARING RESTRICTIONS:  No  FALLS:  Has patient fallen in last 6 months? No    PATIENT GOALS:  Decrease pain, move  easily   OBJECTIVE:  Note: Objective measures were completed at Evaluation unless otherwise noted.  PATIENT SURVEYS:  FOTO 51 1/6: 56   COGNITIVE STATUS: Within functional limits for tasks assessed   SENSATION: Eval: some N/T down legs 12/20: no s/s of nerve in last week  POSTURE:  Eval: Lt post /Rt ant innom rotation.   HAND DOMINANCE:  Left   Body Part #1 Lumbar  PALPATION: Lt post/Rt ant inom rotation at eval  Sidelying hip strength: Lt 28.5 lb  Rt 33.8 lb    TREATMENT:                                                                                                                               Treatment                            2/18: Blank lines following charge title = not provided on this treatment date.   Manual:  TPDN YES Trigger Point Dry Needling  Subsequent Treatment: Instructions provided previously at initial dry needling treatment.   Patient Verbal Consent Given: Yes Education Handout Provided: Previously Provided Muscles Treated: bilat upper trap Electrical Stimulation Performed: No Treatment Response/Outcome: twitch with decreased tension  There-ex: Chin tuck Supine pec stretch There-Act:  Self Care:  Nuro-Re-ed:  Gait Training:    Treatment                            2/4:  Trigger Point Dry Needling  Subsequent Treatment: Instructions provided previously at initial dry needling treatment.   Patient Verbal Consent Given: Yes Education Handout Provided: Previously  Provided Muscles Treated: bilateral upper traps Electrical Stimulation Performed: No Treatment Response/Outcome: twitch with decreased spasm/concordant tightness  Prone rib mobs on Rt side Supine bil first rib depression with cervical sidebend Distraction + rotation Suboccipital release  Cable machines in gym- row (neutral and wide tandem stance); straight arm press down; single arm scaption pull down   Treatment                            1/29:  Cervical SB Lt 30, Rt 24; Rotation 48/48 Trigger Point Dry Needling  Subsequent Treatment: Instructions reviewed, if requested by the patient, prior to subsequent dry needling treatment.   Patient Verbal Consent Given: Yes Education Handout Provided: Previously Provided Muscles Treated: Lt upper trap Electrical Stimulation Performed: No Treatment Response/Outcome: twitch with decreased tension  Lateral step up with glut set and balance Diver Wall sit + UE T Leg press [facility]   PATIENT EDUCATION:  Education details: Teacher, music of condition, POC, HEP, exercise form/rationale Person educated: Patient Education method: Explanation, Demonstration, Tactile cues, and Verbal cues Education comprehension: verbalized understanding, returned demonstration, verbal cues required, tactile cues required, and needs further education  HOME EXERCISE PROGRAM: Long axis  distraction to Lt LE BID [id number]   ASSESSMENT:  CLINICAL IMPRESSION: Pt cont to experience return of tightness and spasm as expected with increased activity and anatomical anomalies. Will f/u next week after long car ride. I do believe she would benefit from long-term visits at a monthly to bimonthly frequency to continue to manage symptoms and avoid increased intensity/injury.     OBJECTIVE IMPAIRMENTS: decreased activity tolerance, decreased mobility, difficulty walking, decreased strength, increased muscle spasms, improper body mechanics, postural  dysfunction, and pain.   ACTIVITY LIMITATIONS: carrying, lifting, bending, sitting, standing, squatting, sleeping, stairs, transfers, bed mobility, bathing, dressing, locomotion level, and caring for others  PERSONAL FACTORS: Time since onset of injury/illness/exacerbation and osteopenia  are also affecting patient's functional outcome.   REHAB POTENTIAL: Good  CLINICAL DECISION MAKING: Evolving/moderate complexity  EVALUATION COMPLEXITY: Moderate   GOALS: Goals reviewed with patient? Yes  SHORT TERM GOALS: Target date: 12/30  Perform HEP to manage pain through car trip Baseline: Goal status: achieved    LONG TERM GOALS: Target date:  POC date  Able to demo proper hip hinge to ankles Baseline: able to reach knee height Goal status: partially met  2.  Bil hip abd strength 5/5 Baseline: <10lb difference Rt to Lt Goal status: achieved  3.  Progressing appropriate core HEP Baseline: requires progression Goal status: ongoing   4.  Meet FOTO goal Baseline: see obj Goal status: ongoing  5. Pt will be able to perform light jogging for short distances to play with grandson  Goal status: NEW  6. Pt will obtain strength and awareness for perform necessary rotational control on the Z axis for return to exercise in golf and bowling.   Goal status: NEW    PLAN:  PT FREQUENCY: 1-2x/week  PT DURATION: 6 weeks  PLANNED INTERVENTIONS: 97164- PT Re-evaluation, 97110-Therapeutic exercises, 97530- Therapeutic activity, 97112- Neuromuscular re-education, 97535- Self Care, 30865- Manual therapy, [phone]- Gait training, [phone]- Aquatic Therapy, Patient/Family education, Balance training, Stair training, Taping, Dry Needling, Joint mobilization, Spinal mobilization, Cryotherapy, and Moist heat.  PLAN FOR NEXT SESSION: TrX  Tom Ragsdale C. Jehu Mccauslin PT, DPT 04/03/23 12:38 PM    Referring diagnosis? M54.16 Treatment diagnosis? (if different than referring diagnosis) M54.59  R29.3 What  was this (referring dx) caused by? []  Surgery []  Fall [x]  Ongoing issue []  Arthritis []  Other: ____________  Laterality: []  Rt []  Lt [x]  Both  Check all possible CPT codes:  *CHOOSE 10 OR LESS*    See Planned Interventions listed in the Plan section of the Evaluation.

## 2023-04-10 ENCOUNTER — Encounter (HOSPITAL_BASED_OUTPATIENT_CLINIC_OR_DEPARTMENT_OTHER): Payer: Self-pay | Admitting: Physical Therapy

## 2023-04-10 ENCOUNTER — Ambulatory Visit (HOSPITAL_BASED_OUTPATIENT_CLINIC_OR_DEPARTMENT_OTHER): Payer: Medicare PPO | Admitting: Physical Therapy

## 2023-04-10 DIAGNOSIS — R293 Abnormal posture: Secondary | ICD-10-CM

## 2023-04-10 DIAGNOSIS — M542 Cervicalgia: Secondary | ICD-10-CM

## 2023-04-10 DIAGNOSIS — M5459 Other low back pain: Secondary | ICD-10-CM

## 2023-04-10 DIAGNOSIS — M5442 Lumbago with sciatica, left side: Secondary | ICD-10-CM | POA: Diagnosis not present

## 2023-04-10 NOTE — Therapy (Signed)
 OUTPATIENT PHYSICAL THERAPY TREATMENT   Patient Name: Dominique Harrell MRN: [id number] DOB:05-Sep-1955, 68 y.o., female Today's Date: 04/10/2023  END OF SESSION:  PT End of Session - 04/10/23 1145     Visit Number 10    Number of Visits 16    Date for PT Re-Evaluation 10/13/23    Authorization Type [employer] M[employer]    PT Start Time 1145    PT Stop Time 1225    PT Time Calculation (min) 40 min    Activity Tolerance Patient tolerated treatment well    Behavior During Therapy WFL for tasks assessed/performed                 Past Medical History:  Diagnosis Date   Hyperlipidemia    Insomnia    Migraine    Osteopenia    Past Surgical History:  Procedure Laterality Date   COLONOSCOPY  08/14/2007   w/Dr.Stark   excision of liposarcoma thigh Left    removed at [facility]; drop in Blood pressure per pt   OOPHORECTOMY  1999   left, dermoid cyst   Patient Active Problem List   Diagnosis Date Noted   Lumbar back pain with radiculopathy affecting right lower extremity 04/07/2015   Osteoporosis, 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   General medical examination 03/02/2011   SHOULDER IMPINGEMENT SYNDROME, LEFT 02/15/2010   Hyperlipidemia 06/08/2006   Depression 06/08/2006   MIGRAINE HEADACHE 06/08/2006     REFERRING PROVIDER:  Sheliah Hatch, MD     REFERRING DIAG:  M54.16 (ICD-10-CM) - Lumbar back pain with radiculopathy affecting right lower extremity     Rationale for Evaluation and Treatment: Rehabilitation  THERAPY DIAG:  Other low back pain  Abnormal posture  Cervicalgia  ONSET DATE: approx 12/13/22   SUBJECTIVE:                                                                                                                                                                                           SUBJECTIVE STATEMENT: Progress Note Reporting Period 01/04/23 to 04/10/2023   See note below for Objective Data and Assessment of Progress/Goals.    Car ride  went really well. Less residual thoracic pain after things like cooking and crafting. Has been able to play with grandson. Barn work 1/week.    PERTINENT HISTORY:  Chronic spinal pain, osteopenia  PAIN:  Are you having pain? Yes: NPRS scale: 0/10 Pain location: across lower back Pain description: stiff Aggravating factors: sitting too long Relieving factors: move around  PRECAUTIONS:  None  RED FLAGS: None   WEIGHT BEARING RESTRICTIONS:  No  FALLS:  Has patient fallen in last 6  months? No    PATIENT GOALS:  Decrease pain, move easily   OBJECTIVE:  Note: Objective measures were completed at Evaluation unless otherwise noted.  PATIENT SURVEYS:  FOTO 51 1/6: 56   COGNITIVE STATUS: Within functional limits for tasks assessed   SENSATION: Eval: some N/T down legs 12/20: no s/s of nerve in last week  POSTURE:  Eval: Lt post /Rt ant innom rotation.   HAND DOMINANCE:  Left   Body Part #1 Lumbar  PALPATION: Lt post/Rt ant inom rotation at eval  Sidelying hip strength: Lt 28.5 lb  Rt 33.8 lb  2/25 Lt  30.1     Rt  28.0 lb  Cervical ROM: 2/25: side bend Lt 28, Rt 22; flx 50, ext 24; rotation Rt 46, Lt 44    TREATMENT:                                                                                                                               Treatment                            2/25: Blank lines following charge title = not provided on this treatment date.   Manual:  TPDN YES Trigger Point Dry Needling  Subsequent Treatment: Instructions provided previously at initial dry needling treatment.   Patient Verbal Consent Given: Yes Education Handout Provided: Previously Provided Muscles Treated: suboccipitals bilateral Electrical Stimulation Performed: No Treatment Response/Outcome: decreased concordant tightness Prone rib mobs- STM to lower quadrant intercostals on Rt Continued lack of Rt post lower quadrant rib mobility due to curve STM bil upper  traps There-ex:  There-Act:  Self Care:  Nuro-Re-ed:  Gait Training:    Treatment                            2/18: Blank lines following charge title = not provided on this treatment date.   Manual:  TPDN YES Trigger Point Dry Needling  Subsequent Treatment: Instructions provided previously at initial dry needling treatment.   Patient Verbal Consent Given: Yes Education Handout Provided: Previously Provided Muscles Treated: bilat upper trap Electrical Stimulation Performed: No Treatment Response/Outcome: twitch with decreased tension  There-ex: Chin tuck Supine pec stretch There-Act:  Self Care:  Nuro-Re-ed:  Gait Training:    Treatment                            2/4:  Trigger Point Dry Needling  Subsequent Treatment: Instructions provided previously at initial dry needling treatment.   Patient Verbal Consent Given: Yes Education Handout Provided: Previously Provided Muscles Treated: bilateral upper traps Electrical Stimulation Performed: No Treatment Response/Outcome: twitch with decreased spasm/concordant tightness  Prone rib mobs on Rt side Supine bil first rib depression with cervical sidebend Distraction + rotation Suboccipital release  Cable machines in gym- row (neutral  and wide tandem stance); straight arm press down; single arm scaption pull down   Treatment                            1/29:  Cervical SB Lt 30, Rt 24; Rotation 48/48 Trigger Point Dry Needling  Subsequent Treatment: Instructions reviewed, if requested by the patient, prior to subsequent dry needling treatment.   Patient Verbal Consent Given: Yes Education Handout Provided: Previously Provided Muscles Treated: Lt upper trap Electrical Stimulation Performed: No Treatment Response/Outcome: twitch with decreased tension  Lateral step up with glut set and balance Diver Wall sit + UE T Leg press [facility] [employer]   PATIENT EDUCATION:  Education details:  Teacher, music of condition, POC, HEP, exercise form/rationale Person educated: Patient Education method: Explanation, Demonstration, Tactile cues, and Verbal cues Education comprehension: verbalized understanding, returned demonstration, verbal cues required, tactile cues required, and needs further education  HOME EXERCISE PROGRAM: Long axis distraction to Lt LE BID [id number]   ASSESSMENT:  CLINICAL IMPRESSION: Re-imaging for OP progression in 2026. OP did progress at last image.   Pt presented today for PT re-evaluation at 10th visit. Pt continues to make progress in midline equality regarding strength and ROM with some limits as to be expected with anatomy. We discussed return to various activities and awareness/respect for body tolerance. At this time, I would like to f/u with the pt 1/month for treatment of cervical-thoracic tightness to reduce risk of return of injury/excessive spasm. I would also like to continue to slowly progress exercise and WB for lower body to reduce further OP progression.     OBJECTIVE IMPAIRMENTS: decreased activity tolerance, decreased mobility, difficulty walking, decreased strength, increased muscle spasms, improper body mechanics, postural dysfunction, and pain.   ACTIVITY LIMITATIONS: carrying, lifting, bending, sitting, standing, squatting, sleeping, stairs, transfers, bed mobility, bathing, dressing, locomotion level, and caring for others  PERSONAL FACTORS: Time since onset of injury/illness/exacerbation and osteopenia  are also affecting patient's functional outcome.   REHAB POTENTIAL: Good  CLINICAL DECISION MAKING: Evolving/moderate complexity  EVALUATION COMPLEXITY: Moderate   GOALS: Goals reviewed with patient? Yes  SHORT TERM GOALS: Target date: 12/30  Perform HEP to manage pain through car trip Baseline: Goal status: achieved    LONG TERM GOALS: Target date:  POC date  Able to demo proper hip hinge to ankles Baseline: now able to  reach distal 1/3 of tibia Goal status: partially met  2.  Bil hip abd strength 5/5 Baseline: <10lb difference Rt to Lt Goal status: achieved  3.  Progressing appropriate core HEP Baseline: requires progression Goal status: ongoing   4.  Meet FOTO goal Baseline: see obj Goal status: deferred- FOTO ended  5. Pt will be able to perform light jogging for short distances to play with grandson  Goal status: has done well with it so far  6. Pt will obtain strength and awareness for perform necessary rotational control on the Z axis for return to exercise in golf and bowling.   Goal status: has not tried at this point    PLAN:  PT FREQUENCY: 1-2x/week  PT DURATION: 6 weeks  PLANNED INTERVENTIONS: 97164- PT Re-evaluation, 97110-Therapeutic exercises, 97530- Therapeutic activity, 97112- Neuromuscular re-education, 97535- Self Care, 54098- Manual therapy, [phone]- Gait training, [phone]- Aquatic Therapy, Patient/Family education, Balance training, Stair training, Taping, Dry Needling, Joint mobilization, Spinal mobilization, Cryotherapy, and Moist heat.  PLAN FOR NEXT SESSION: TrX  Jeffrey Graefe C. Patton Rabinovich PT, DPT  04/10/23 12:44 PM    Referring diagnosis? M54.16 Treatment diagnosis? (if different than referring diagnosis) M54.59  R29.3 What was this (referring dx) caused by? []  Surgery []  Fall [x]  Ongoing issue []  Arthritis []  Other: ____________  Laterality: []  Rt []  Lt [x]  Both  Check all possible CPT codes:  *CHOOSE 10 OR LESS*    See Planned Interventions listed in the Plan section of the Evaluation.

## 2023-04-26 DIAGNOSIS — Z1231 Encounter for screening mammogram for malignant neoplasm of breast: Secondary | ICD-10-CM | POA: Diagnosis not present

## 2023-04-26 LAB — HM MAMMOGRAPHY

## 2023-05-09 ENCOUNTER — Encounter: Payer: Self-pay | Admitting: Family Medicine

## 2023-05-12 ENCOUNTER — Ambulatory Visit (HOSPITAL_BASED_OUTPATIENT_CLINIC_OR_DEPARTMENT_OTHER): Payer: Medicare PPO | Attending: Family Medicine | Admitting: Physical Therapy

## 2023-05-12 ENCOUNTER — Encounter (HOSPITAL_BASED_OUTPATIENT_CLINIC_OR_DEPARTMENT_OTHER): Payer: Self-pay | Admitting: Physical Therapy

## 2023-05-12 DIAGNOSIS — R293 Abnormal posture: Secondary | ICD-10-CM | POA: Diagnosis not present

## 2023-05-12 DIAGNOSIS — M542 Cervicalgia: Secondary | ICD-10-CM | POA: Diagnosis not present

## 2023-05-12 DIAGNOSIS — M5442 Lumbago with sciatica, left side: Secondary | ICD-10-CM | POA: Diagnosis not present

## 2023-05-12 DIAGNOSIS — M5459 Other low back pain: Secondary | ICD-10-CM | POA: Insufficient documentation

## 2023-05-12 NOTE — Therapy (Signed)
 OUTPATIENT PHYSICAL THERAPY TREATMENT   Patient Name: Dominique Harrell MRN: [id number] DOB:Feb 06, 1956, 68 y.o., female Today's Date: 05/12/2023  END OF SESSION:  PT End of Session - 05/12/23 1001     Visit Number 11    Number of Visits 16    Date for PT Re-Evaluation 10/13/23    Authorization Type [employer] M[employer]    Authorization Time Period 3.3.25  -8.30.25    Authorization - Visit Number 1    Authorization - Number of Visits 6    PT Start Time 1000    PT Stop Time 1033    PT Time Calculation (min) 33 min    Activity Tolerance Patient tolerated treatment well    Behavior During Therapy WFL for tasks assessed/performed                 Past Medical History:  Diagnosis Date   Hyperlipidemia    Insomnia    Migraine    Osteopenia    Past Surgical History:  Procedure Laterality Date   COLONOSCOPY  08/14/2007   w/Dr.Stark   excision of liposarcoma thigh Left    removed at [facility]; drop in Blood pressure per pt   OOPHORECTOMY  1999   left, dermoid cyst   Patient Active Problem List   Diagnosis Date Noted   Lumbar back pain with radiculopathy affecting right lower extremity 04/07/2015   Osteoporosis, 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   General medical examination 03/02/2011   SHOULDER IMPINGEMENT SYNDROME, LEFT 02/15/2010   Hyperlipidemia 06/08/2006   Depression 06/08/2006   MIGRAINE HEADACHE 06/08/2006     REFERRING PROVIDER:  Sheliah Hatch, MD     REFERRING DIAG:  M54.16 (ICD-10-CM) - Lumbar back pain with radiculopathy affecting right lower extremity     Rationale for Evaluation and Treatment: Rehabilitation  THERAPY DIAG:  Other low back pain  Abnormal posture  Cervicalgia  Acute left-sided low back pain with left-sided sciatica  ONSET DATE: approx 12/13/22   SUBJECTIVE:                                                                                                                                                                                            SUBJECTIVE STATEMENT:  A little ache around the lower back. 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 Lifting hay bails etc.... would like to be able to lift the water buckets (about 42lb) I did pick up my grand son (about 48lb).   PERTINENT HISTORY:  Chronic spinal pain, osteopenia  PAIN:  Are you having pain? Yes: NPRS scale: 0/10 Pain location: across lower back Pain description: stiff Aggravating factors:  sitting too long Relieving factors: move around  PRECAUTIONS:  None  RED FLAGS: None   WEIGHT BEARING RESTRICTIONS:  No  FALLS:  Has patient fallen in last 6 months? No    PATIENT GOALS:  Decrease pain, move easily   OBJECTIVE:  Note: Objective measures were completed at Evaluation unless otherwise noted.  PATIENT SURVEYS:  FOTO 51 1/6: 56   COGNITIVE STATUS: Within functional limits for tasks assessed   SENSATION: Eval: some N/T down legs 12/20: no s/s of nerve in last week  POSTURE:  Eval: Lt post /Rt ant innom rotation.   HAND DOMINANCE:  Left   Body Part #1 Lumbar  PALPATION: Lt post/Rt ant inom rotation at eval  Sidelying hip strength: Lt 28.5 lb  Rt 33.8 lb  2/25 Lt  30.1     Rt  28.0 lb  Cervical ROM: 2/25: side bend Lt 28, Rt 22; flx 50, ext 24; rotation Rt 46, Lt 44  Cervical ROM: 3/29: side bend Lt 22, Rt 25; flx 48, ext 34; rotation Rt 50, Lt 42- pre tx; 50/52 post tx    TREATMENT:                                                                                                                               Treatment                            3/29: Blank lines following charge title = not provided on this treatment date.   Manual:  TPDN YES Trigger Point Dry Needling  Subsequent Treatment: Instructions provided previously at initial dry needling treatment.   Patient Verbal Consent Given: Yes Education Handout Provided: Previously Provided Muscles Treated: bilateral upper traps Electrical  Stimulation Performed: No Treatment Response/Outcome: twitch with decreased concordant spasm Prone bil first rib depression Gross rib mobility Rt scapular mobility with STM to rhomboids There-ex: Qped UE thread through bilateral- stretch and with horiz abd activation There-Act: Proper twisting for hay bail lifting.  Self Care:  Nuro-Re-ed:  Gait Training:    Treatment                            2/25: Blank lines following charge title = not provided on this treatment date.   Manual:  TPDN YES Trigger Point Dry Needling  Subsequent Treatment: Instructions provided previously at initial dry needling treatment.   Patient Verbal Consent Given: Yes Education Handout Provided: Previously Provided Muscles Treated: suboccipitals bilateral Electrical Stimulation Performed: No Treatment Response/Outcome: decreased concordant tightness Prone rib mobs- STM to lower quadrant intercostals on Rt Continued lack of Rt post lower quadrant rib mobility due to curve STM bil upper traps There-ex:  There-Act:  Self Care:  Nuro-Re-ed:  Gait Training:      PATIENT EDUCATION:  Education details: Anatomy of condition, POC, HEP, exercise form/rationale Person educated: Patient Education  method: Explanation, Demonstration, Tactile cues, and Verbal cues Education comprehension: verbalized understanding, returned demonstration, verbal cues required, tactile cues required, and needs further education  HOME EXERCISE PROGRAM: Long axis distraction to Lt LE BID [id number]    ASSESSMENT:  CLINICAL IMPRESSION: Arrived with limited ROM due to muscular tightness. Was able to return full ROM with DN and rib/scapular mobility. Will continue to progress weight with exercises and working in the barn.    OBJECTIVE IMPAIRMENTS: decreased activity tolerance, decreased mobility, difficulty walking, decreased strength, increased muscle spasms, improper body mechanics, postural dysfunction, and pain.    ACTIVITY LIMITATIONS: carrying, lifting, bending, sitting, standing, squatting, sleeping, stairs, transfers, bed mobility, bathing, dressing, locomotion level, and caring for others  PERSONAL FACTORS: Time since onset of injury/illness/exacerbation and osteopenia  are also affecting patient's functional outcome.   REHAB POTENTIAL: Good  CLINICAL DECISION MAKING: Evolving/moderate complexity  EVALUATION COMPLEXITY: Moderate   GOALS: Goals reviewed with patient? Yes  SHORT TERM GOALS: Target date: 12/30  Perform HEP to manage pain through car trip Baseline: Goal status: achieved    LONG TERM GOALS: Target date:  POC date  Able to demo proper hip hinge to ankles Baseline: now able to reach distal 1/3 of tibia Goal status: partially met  2.  Bil hip abd strength 5/5 Baseline: <10lb difference Rt to Lt Goal status: achieved  3.  Progressing appropriate core HEP Baseline: requires progression Goal status: ongoing   4.  Meet FOTO goal Baseline: see obj Goal status: deferred- FOTO ended  5. Pt will be able to perform light jogging for short distances to play with grandson  Goal status: has done well with it so far  6. Pt will obtain strength and awareness for perform necessary rotational control on the Z axis for return to exercise in golf and bowling.   Goal status: has not tried at this point    PLAN:  PT FREQUENCY: 1-2x/week  PT DURATION: 6 weeks  PLANNED INTERVENTIONS: 97164- PT Re-evaluation, 97110-Therapeutic exercises, 97530- Therapeutic activity, 97112- Neuromuscular re-education, 97535- Self Care, 54098- Manual therapy, [phone]- Gait training, [phone]- Aquatic Therapy, Patient/Family education, Balance training, Stair training, Taping, Dry Needling, Joint mobilization, Spinal mobilization, Cryotherapy, and Moist heat.  PLAN FOR NEXT SESSION: TrX  Braedin Millhouse C. Aston Lawhorn PT, DPT 05/12/23 10:34 AM    Referring diagnosis? M54.16 Treatment diagnosis? (if  different than referring diagnosis) M54.59  R29.3 What was this (referring dx) caused by? []  Surgery []  Fall [x]  Ongoing issue []  Arthritis []  Other: ____________  Laterality: []  Rt []  Lt [x]  Both  Check all possible CPT codes:  *CHOOSE 10 OR LESS*    See Planned Interventions listed in the Plan section of the Evaluation.

## 2023-05-18 DIAGNOSIS — H2513 Age-related nuclear cataract, bilateral: Secondary | ICD-10-CM | POA: Diagnosis not present

## 2023-06-04 ENCOUNTER — Other Ambulatory Visit: Payer: Self-pay

## 2023-06-04 DIAGNOSIS — E785 Hyperlipidemia, unspecified: Secondary | ICD-10-CM

## 2023-06-04 MED ORDER — SIMVASTATIN 40 MG PO TABS: ORAL_TABLET | ORAL | 1 refills | Status: DC

## 2023-06-11 ENCOUNTER — Ambulatory Visit (HOSPITAL_BASED_OUTPATIENT_CLINIC_OR_DEPARTMENT_OTHER): Payer: Medicare PPO | Attending: Family Medicine | Admitting: Physical Therapy

## 2023-06-11 ENCOUNTER — Encounter (HOSPITAL_BASED_OUTPATIENT_CLINIC_OR_DEPARTMENT_OTHER): Payer: Self-pay | Admitting: Physical Therapy

## 2023-06-11 DIAGNOSIS — R293 Abnormal posture: Secondary | ICD-10-CM | POA: Diagnosis not present

## 2023-06-11 DIAGNOSIS — M5459 Other low back pain: Secondary | ICD-10-CM | POA: Diagnosis not present

## 2023-06-11 DIAGNOSIS — M542 Cervicalgia: Secondary | ICD-10-CM | POA: Diagnosis not present

## 2023-06-11 NOTE — Therapy (Signed)
 OUTPATIENT PHYSICAL THERAPY TREATMENT   Patient Name: Dominique Harrell MRN: [id number] DOB:04/10/1955, 68 y.o., female Today's Date: 06/11/2023  END OF SESSION:  PT End of Session - 06/11/23 1147     Visit Number 12    Number of Visits 16    Date for PT Re-Evaluation 10/13/23    Authorization Type [employer] M[employer]    Authorization Time Period 3.3.25  -8.30.25    Authorization - Visit Number 2    Authorization - Number of Visits 6    PT Start Time 1147    PT Stop Time 1228    PT Time Calculation (min) 41 min    Activity Tolerance Patient tolerated treatment well    Behavior During Therapy WFL for tasks assessed/performed                  Past Medical History:  Diagnosis Date   Hyperlipidemia    Insomnia    Migraine    Osteopenia    Past Surgical History:  Procedure Laterality Date   COLONOSCOPY  08/14/2007   w/Dr.Stark   excision of liposarcoma thigh Left    removed at [facility]; drop in Blood pressure per pt   OOPHORECTOMY  1999   left, dermoid cyst   Patient Active Problem List   Diagnosis Date Noted   Lumbar back pain with radiculopathy affecting right lower extremity 04/07/2015   Osteoporosis, 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   General medical examination 03/02/2011   SHOULDER IMPINGEMENT SYNDROME, LEFT 02/15/2010   Hyperlipidemia 06/08/2006   Depression 06/08/2006   MIGRAINE HEADACHE 06/08/2006     REFERRING PROVIDER:  Jess Morita, MD     REFERRING DIAG:  M54.16 (ICD-10-CM) - Lumbar back pain with radiculopathy affecting right lower extremity     Rationale for Evaluation and Treatment: Rehabilitation  THERAPY DIAG:  Other low back pain  Abnormal posture  Cervicalgia  ONSET DATE: approx 12/13/22   SUBJECTIVE:                                                                                                                                                                                           SUBJECTIVE STATEMENT:  Increasing  activitiy and good about doing stretches. Has not tried bowling yet. I would like to be able to lift more and get back some muscle.   PERTINENT HISTORY:  Chronic spinal pain, osteopenia  PAIN:  Are you having pain? Yes: NPRS scale: 0/10 Pain location: across lower back Pain description: stiff Aggravating factors: sitting too long Relieving factors: move around  PRECAUTIONS:  None  RED FLAGS: None   WEIGHT BEARING RESTRICTIONS:  No  FALLS:  Has patient fallen in last 6 months? No    PATIENT GOALS:  Decrease pain, move easily   OBJECTIVE:  Note: Objective measures were completed at Evaluation unless otherwise noted.  PATIENT SURVEYS:  FOTO 51 1/6: 56 - Foto ended   COGNITIVE STATUS: Within functional limits for tasks assessed   SENSATION: Eval: some N/T down legs 12/20: no s/s of nerve in last week  POSTURE:  Eval: Lt post /Rt ant innom rotation.   HAND DOMINANCE:  Left   Body Part #1 Lumbar  PALPATION: Lt post/Rt ant inom rotation at eval  Sidelying hip strength: Lt 28.5 lb  Rt 33.8 lb  2/25 Lt  30.1     Rt  28.0 lb  Cervical ROM: 2/25: side bend Lt 28, Rt 22; flx 50, ext 24; rotation Rt 46, Lt 44  Cervical ROM: 3/29: side bend Lt 22, Rt 25; flx 48, ext 34; rotation Rt 50, Lt 42- pre tx; 50/52 post tx    TREATMENT:                                                                                                                               Treatment                            06/11/23: Blank lines following charge title = not provided on this treatment date.   Manual:  TPDN YES Trigger Point Dry Needling  Subsequent Treatment: Instructions provided previously at initial dry needling treatment.   Patient Verbal Consent Given: Yes Education Handout Provided: Previously Provided Muscles Treated: bilateral upper traps Electrical Stimulation Performed: No Treatment Response/Outcome: twitch with decreased concordant tension Gross rib mobs in  prone Bilat first rib depression Suboccipital STM Cervical traction +sidebend There-ex: Supine core progression- 90/90, toe taps, dead bug, bilat frog press, reverse crunch Supine chin tuck- also added to core There-Act:  Self Care:  Nuro-Re-ed:  Gait Training:    Treatment                            3/29: Blank lines following charge title = not provided on this treatment date.   Manual:  TPDN YES Trigger Point Dry Needling  Subsequent Treatment: Instructions provided previously at initial dry needling treatment.   Patient Verbal Consent Given: Yes Education Handout Provided: Previously Provided Muscles Treated: bilateral upper traps Electrical Stimulation Performed: No Treatment Response/Outcome: twitch with decreased concordant spasm Prone bil first rib depression Gross rib mobility Rt scapular mobility with STM to rhomboids There-ex: Qped UE thread through bilateral- stretch and with horiz abd activation There-Act: Proper twisting for hay bail lifting.  Self Care:  Nuro-Re-ed:  Gait Training:      PATIENT EDUCATION:  Education details: Teacher, music of condition, POC, HEP, exercise form/rationale Person educated: Patient Education method: Explanation, Demonstration, Tactile cues, and Verbal cues Education comprehension: verbalized understanding,  returned demonstration, verbal cues required, tactile cues required, and needs further education  HOME EXERCISE PROGRAM: Long axis distraction to Lt LE BID [id number]    ASSESSMENT:  CLINICAL IMPRESSION: Pt continues to make progress in functional tolerance and progression of strengthening. ROM improved with manual therapy and important to continue as anatomy presents risk of further injury. Core stability added to HEP today and will progress to CKC UE core as she gets stronger. ROM equal in cervical rotation today with 50% Rt sidebend vs Lt.     OBJECTIVE IMPAIRMENTS: decreased activity tolerance, decreased  mobility, difficulty walking, decreased strength, increased muscle spasms, improper body mechanics, postural dysfunction, and pain.   ACTIVITY LIMITATIONS: carrying, lifting, bending, sitting, standing, squatting, sleeping, stairs, transfers, bed mobility, bathing, dressing, locomotion level, and caring for others  PERSONAL FACTORS: Time since onset of injury/illness/exacerbation and osteopenia  are also affecting patient's functional outcome.   REHAB POTENTIAL: Good  CLINICAL DECISION MAKING: Evolving/moderate complexity  EVALUATION COMPLEXITY: Moderate   GOALS: Goals reviewed with patient? Yes  SHORT TERM GOALS: Target date: 12/30  Perform HEP to manage pain through car trip Baseline: Goal status: achieved    LONG TERM GOALS: Target date:  POC date  Able to demo proper hip hinge to ankles Baseline: now able to reach distal 1/3 of tibia Goal status: partially met  2.  Bil hip abd strength 5/5 Baseline: <10lb difference Rt to Lt Goal status: achieved  3.  Progressing appropriate core HEP Baseline: requires progression Goal status: ongoing   4.  Meet FOTO goal Baseline: see obj Goal status: deferred- FOTO ended  5. Pt will be able to perform light jogging for short distances to play with grandson  Goal status: has done well with it so far  6. Pt will obtain strength and awareness for perform necessary rotational control on the Z axis for return to exercise in golf and bowling.   Goal status: has not tried at this point    PLAN:  PT FREQUENCY: 1-2x/week  PT DURATION: 6 weeks  PLANNED INTERVENTIONS: 97164- PT Re-evaluation, 97110-Therapeutic exercises, 97530- Therapeutic activity, 97112- Neuromuscular re-education, 97535- Self Care, 54098- Manual therapy, [phone]- Gait training, [phone]- Aquatic Therapy, Patient/Family education, Balance training, Stair training, Taping, Dry Needling, Joint mobilization, Spinal mobilization, Cryotherapy, and Moist heat.  PLAN FOR  NEXT SESSION: TrX  Xuan Mateus C. Leiyah Maultsby PT, DPT 06/11/23 12:59 PM    Referring diagnosis? M54.16 Treatment diagnosis? (if different than referring diagnosis) M54.59  R29.3 What was this (referring dx) caused by? []  Surgery []  Fall [x]  Ongoing issue []  Arthritis []  Other: ____________  Laterality: []  Rt []  Lt [x]  Both  Check all possible CPT codes:  *CHOOSE 10 OR LESS*    See Planned Interventions listed in the Plan section of the Evaluation.

## 2023-07-02 ENCOUNTER — Ambulatory Visit (HOSPITAL_BASED_OUTPATIENT_CLINIC_OR_DEPARTMENT_OTHER): Payer: Medicare PPO | Attending: Family Medicine | Admitting: Physical Therapy

## 2023-07-02 ENCOUNTER — Encounter (HOSPITAL_BASED_OUTPATIENT_CLINIC_OR_DEPARTMENT_OTHER): Payer: Self-pay | Admitting: Physical Therapy

## 2023-07-02 DIAGNOSIS — M5459 Other low back pain: Secondary | ICD-10-CM | POA: Insufficient documentation

## 2023-07-02 DIAGNOSIS — M542 Cervicalgia: Secondary | ICD-10-CM | POA: Insufficient documentation

## 2023-07-02 DIAGNOSIS — M5442 Lumbago with sciatica, left side: Secondary | ICD-10-CM | POA: Insufficient documentation

## 2023-07-02 DIAGNOSIS — R293 Abnormal posture: Secondary | ICD-10-CM | POA: Diagnosis not present

## 2023-07-02 NOTE — Therapy (Signed)
 OUTPATIENT PHYSICAL THERAPY TREATMENT   Patient Name: Dominique Harrell MRN: [id number] DOB:[birth year], 68 y.o., female Today's Date: 07/02/2023  END OF SESSION:  PT End of Session - 07/02/23 1143     Visit Number 13    Number of Visits 16    Date for PT Re-Evaluation 10/13/23    Authorization Type [employer] MCR    Authorization Time Period 3.3.25  -8.30.25    Authorization - Visit Number 3    Authorization - Number of Visits 6    PT Start Time 1144    PT Stop Time 1215    PT Time Calculation (min) 31 min    Activity Tolerance Patient tolerated treatment well    Behavior During Therapy WFL for tasks assessed/performed                   Past Medical History:  Diagnosis Date   Hyperlipidemia    Insomnia    Migraine    Osteopenia    Past Surgical History:  Procedure Laterality Date   COLONOSCOPY  08/14/2007   w/Dr.Stark   excision of liposarcoma thigh Left    removed at [facility]; drop in Blood pressure per pt   OOPHORECTOMY  1999   left, dermoid cyst   Patient Active Problem List   Diagnosis Date Noted   Lumbar back pain with radiculopathy affecting right lower extremity 04/07/2015   Osteoporosis, 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   General medical examination 03/02/2011   SHOULDER IMPINGEMENT SYNDROME, LEFT 02/15/2010   Hyperlipidemia 06/08/2006   Depression 06/08/2006   MIGRAINE HEADACHE 06/08/2006     REFERRING PROVIDER:  Jess Morita, MD     REFERRING DIAG:  M54.16 (ICD-10-CM) - Lumbar back pain with radiculopathy affecting right lower extremity     Rationale for Evaluation and Treatment: Rehabilitation  THERAPY DIAG:  Other low back pain  Abnormal posture  Cervicalgia  Acute left-sided low back pain with left-sided sciatica  ONSET DATE: approx 12/13/22   SUBJECTIVE:                                                                                                                                                                                            SUBJECTIVE STATEMENT:  I feel like I am making progress well. Did a lot of yard work this weekend. Chose to avoid lifting really heavy objects. Noticed recent HA from Lt cervical region that I was able to make go away.   PERTINENT HISTORY:  Chronic spinal pain, osteopenia  PAIN:  Are you having pain? Yes: NPRS scale: 0/10 Pain location: across lower back Pain description: stiff Aggravating factors: sitting too long  Relieving factors: move around  PRECAUTIONS:  None  RED FLAGS: None   WEIGHT BEARING RESTRICTIONS:  No  FALLS:  Has patient fallen in last 6 months? No    PATIENT GOALS:  Decrease pain, move easily   OBJECTIVE:  Note: Objective measures were completed at Evaluation unless otherwise noted.  PATIENT SURVEYS:  FOTO 51 1/6: 56 - Foto ended 5/19: NDI 11/50  COGNITIVE STATUS: Within functional limits for tasks assessed   SENSATION: Eval: some N/T down legs 12/20: no s/s of nerve in last week  POSTURE:  Eval: Lt post /Rt ant innom rotation.   HAND DOMINANCE:  Left   Body Part #1 Lumbar  PALPATION: Lt post/Rt ant inom rotation at eval  Sidelying hip strength: Lt 28.5 lb  Rt 33.8 lb  2/25 Lt  30.1     Rt  28.0 lb  Cervical ROM: 2/25: side bend Lt 28, Rt 22; flx 50, ext 24; rotation Rt 46, Lt 44  Cervical ROM: 3/29: side bend Lt 22, Rt 25; flx 48, ext 34; rotation Rt 50, Lt 42- pre tx; 50/52 post tx  Cervical ROM: 5/19: side bend Lt 28, Rt 24; flx 42, ext 30; rotation Rt 48, Lt 48    TREATMENT:                                                                                                                               Treatment                            5/19: Blank lines following charge title = not provided on this treatment date.   Manual:  TPDN YES Trigger Point Dry Needling  Subsequent Treatment: Instructions provided previously at initial dry needling treatment.   Patient Verbal Consent Given: Yes Education  Handout Provided: Previously Provided Muscles Treated: bilateral upper traps & levator scap Electrical Stimulation Performed: No Treatment Response/Outcome: twitch with decreased concordant tension Prone rib mobs- most notable limitations on left side, mid-thoracic There-ex:  There-Act:  Self Care: Reviewed importance of recognizing limitations and respecting pain Nuro-Re-ed:  Gait Training:    Treatment                            06/11/23: Blank lines following charge title = not provided on this treatment date.   Manual:  TPDN YES Trigger Point Dry Needling  Subsequent Treatment: Instructions provided previously at initial dry needling treatment.   Patient Verbal Consent Given: Yes Education Handout Provided: Previously Provided Muscles Treated: bilateral upper traps Electrical Stimulation Performed: No Treatment Response/Outcome: twitch with decreased concordant tension Gross rib mobs in prone Bilat first rib depression Suboccipital STM Cervical traction +sidebend There-ex: Supine core progression- 90/90, toe taps, dead bug, bilat frog press, reverse crunch Supine chin tuck- also added to core There-Act:  Self Care:  Nuro-Re-ed:  Gait Training:  PATIENT EDUCATION:  Education details: Teacher, music of condition, POC, HEP, exercise form/rationale Person educated: Patient Education method: Explanation, Demonstration, Tactile cues, and Verbal cues Education comprehension: verbalized understanding, returned demonstration, verbal cues required, tactile cues required, and needs further education  HOME EXERCISE PROGRAM: Long axis distraction to Lt LE BID [id number]    ASSESSMENT:  CLINICAL IMPRESSION: Pt has done very well maintaining ROM and mobility with tension and limitations presenting as expected with active lifestyle. 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     OBJECTIVE IMPAIRMENTS: decreased activity tolerance, decreased mobility, difficulty walking, decreased strength, increased muscle spasms, improper body mechanics, postural dysfunction, and pain.   ACTIVITY LIMITATIONS: carrying, lifting, bending, sitting, standing, squatting, sleeping, stairs, transfers, bed mobility, bathing, dressing, locomotion level, and caring for others  PERSONAL FACTORS: Time since onset of injury/illness/exacerbation and osteopenia are also affecting patient's functional outcome.   REHAB POTENTIAL: Good  CLINICAL DECISION MAKING: Evolving/moderate complexity  EVALUATION COMPLEXITY: Moderate   GOALS: Goals reviewed with patient? Yes  SHORT TERM GOALS: Target date: 12/30  Perform HEP to manage pain through car trip Baseline: Goal status: achieved    LONG TERM GOALS: Target date:  POC date  Able to demo proper hip hinge to ankles Baseline: now able to reach distal 1/3 of tibia Goal status: partially met  2.  Bil hip abd strength 5/5 Baseline: <10lb difference Rt to Lt Goal status: achieved  3.  Progressing appropriate core HEP Baseline: requires progression Goal status: ongoing   4.  Meet FOTO goal Baseline: see obj Goal status: deferred- FOTO ended  5. Pt will be able to perform light jogging for short distances to play with grandson  Goal status: has done well with it so far  6. Pt will obtain strength and awareness for perform necessary rotational control on the Z axis for return to exercise in golf and bowling.   Goal status: has not tried at this point    PLAN:  PT FREQUENCY: 1-2x/week  PT DURATION: 6 weeks  PLANNED INTERVENTIONS: 97164- PT Re-evaluation, 97110-Therapeutic exercises, 97530- Therapeutic activity, 97112- Neuromuscular re-education, 97535- Self Care, 19147- Manual therapy, [phone]- Gait training, [phone]- Aquatic Therapy, Patient/Family  education, Balance training, Stair training, Taping, Dry Needling, Joint mobilization, Spinal mobilization, Cryotherapy, and Moist heat.  PLAN FOR NEXT SESSION: TrX  Latifah Padin C. Adamary Savary PT, DPT 07/02/23 12:26 PM    Referring diagnosis? M54.16 Treatment diagnosis? (if different than referring diagnosis) M54.59  R29.3 What was this (referring dx) caused by? []  Surgery []  Fall [x]  Ongoing issue []  Arthritis []  Other: ____________  Laterality: []  Rt []  Lt [x]  Both  Check all possible CPT codes:  *CHOOSE 10 OR LESS*    See Planned Interventions listed in the Plan section of the Evaluation.

## 2023-08-06 ENCOUNTER — Encounter (HOSPITAL_BASED_OUTPATIENT_CLINIC_OR_DEPARTMENT_OTHER): Payer: Self-pay | Admitting: Physical Therapy

## 2023-08-06 ENCOUNTER — Ambulatory Visit (HOSPITAL_BASED_OUTPATIENT_CLINIC_OR_DEPARTMENT_OTHER): Payer: Medicare PPO | Attending: Family Medicine | Admitting: Physical Therapy

## 2023-08-06 DIAGNOSIS — M5459 Other low back pain: Secondary | ICD-10-CM | POA: Insufficient documentation

## 2023-08-06 DIAGNOSIS — M542 Cervicalgia: Secondary | ICD-10-CM | POA: Diagnosis not present

## 2023-08-06 DIAGNOSIS — M5442 Lumbago with sciatica, left side: Secondary | ICD-10-CM | POA: Insufficient documentation

## 2023-08-06 DIAGNOSIS — R293 Abnormal posture: Secondary | ICD-10-CM | POA: Insufficient documentation

## 2023-08-06 NOTE — Therapy (Signed)
 OUTPATIENT PHYSICAL THERAPY TREATMENT   Patient Name: Dominique Harrell MRN: [id number] DOB:1955/05/30, 68 y.o., female Today's Date: 08/06/2023  END OF SESSION:  PT End of Session - 08/06/23 1144     Visit Number 14    Number of Visits 16    Date for PT Re-Evaluation 10/13/23    Authorization Type [employer] M[employer]    Authorization Time Period 3.3.25  -8.30.25    Authorization - Visit Number 4    Authorization - Number of Visits 6    PT Start Time 1144    PT Stop Time 1222    PT Time Calculation (min) 38 min    Activity Tolerance Patient tolerated treatment well    Behavior During Therapy WFL for tasks assessed/performed                Past Medical History:  Diagnosis Date   Hyperlipidemia    Insomnia    Migraine    Osteopenia    Past Surgical History:  Procedure Laterality Date   COLONOSCOPY  08/14/2007   w/Dr.Stark   excision of liposarcoma thigh Left    removed at [facility]; drop in Blood pressure per pt   OOPHORECTOMY  1999   left, dermoid cyst   Patient Active Problem List   Diagnosis Date Noted   Lumbar back pain with radiculopathy affecting right lower extremity 04/07/2015   Osteoporosis, 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   General medical examination 03/02/2011   SHOULDER IMPINGEMENT SYNDROME, LEFT 02/15/2010   Hyperlipidemia 06/08/2006   Depression 06/08/2006   MIGRAINE HEADACHE 06/08/2006     REFERRING PROVIDER:  Mahlon Comer BRAVO, MD     REFERRING DIAG:  M54.16 (ICD-10-CM) - Lumbar back pain with radiculopathy affecting right lower extremity     Rationale for Evaluation and Treatment: Rehabilitation  THERAPY DIAG:  Other low back pain  Abnormal posture  Cervicalgia  Acute left-sided low back pain with left-sided sciatica  ONSET DATE: approx 12/13/22   SUBJECTIVE:                                                                                                                                                                                            SUBJECTIVE STATEMENT:  Doing more at the barn and being careful. Back is not hurting, I can feel the same area in the neck but pretty good. Able to keep up with littlest grand children.   PERTINENT HISTORY:  Chronic spinal pain, osteopenia  PAIN:  Are you having pain? Yes: NPRS scale: 0/10 Pain location: across lower back Pain description: stiff Aggravating factors: sitting too long Relieving factors: move around  PRECAUTIONS:  None  RED FLAGS: None   WEIGHT BEARING RESTRICTIONS:  No  FALLS:  Has patient fallen in last 6 months? No    PATIENT GOALS:  Decrease pain, move easily   OBJECTIVE:  Note: Objective measures were completed at Evaluation unless otherwise noted.  PATIENT SURVEYS:  FOTO 51 1/6: 56 - Foto ended 5/19: NDI 11/50 6/23: NDI 14/50  COGNITIVE STATUS: Within functional limits for tasks assessed   SENSATION: Eval: some N/T down legs 12/20: no s/s of nerve in last week  POSTURE:  Eval: Lt post /Rt ant innom rotation.   HAND DOMINANCE:  Left   Body Part #1 Lumbar  PALPATION: Lt post/Rt ant inom rotation at eval  Sidelying hip strength: Lt 28.5 lb  Rt 33.8 lb  2/25 Lt  30.1     Rt  28.0 lb  Cervical ROM: 2/25: side bend Lt 28, Rt 22; flx 50, ext 24; rotation Rt 46, Lt 44  Cervical ROM: 3/29: side bend Lt 22, Rt 25; flx 48, ext 34; rotation Rt 50, Lt 42- pre tx; 50/52 post tx  Cervical ROM: 5/19: side bend Lt 28, Rt 24; flx 42, ext 30; rotation Rt 48, Lt 48  Cervical ROM: 6/23: side bend Lt 30, Rt 28; flx 36, ext 28; rotation Rt 46, Lt 50    TREATMENT:                                                                                                                               Treatment                            6/23: Blank lines following charge title = not provided on this treatment date.   Manual:  TPDN No Prone STM bilateral upper traps, levator scap, rhomboids, subscap, thoracic paraspinals Gross rib  mobilizations, scapular glides- paired with STM Self Care: Discussion of posture- tends to lean to the right when seated on the couch Stiffness and muscle spasm    Treatment                            5/19: Blank lines following charge title = not provided on this treatment date.   Manual:  TPDN YES Trigger Point Dry Needling  Subsequent Treatment: Instructions provided previously at initial dry needling treatment.   Patient Verbal Consent Given: Yes Education Handout Provided: Previously Provided Muscles Treated: bilateral upper traps & levator scap Electrical Stimulation Performed: No Treatment Response/Outcome: twitch with decreased concordant tension Prone rib mobs- most notable limitations on left side, mid-thoracic There-ex:  There-Act:  Self Care: Reviewed importance of recognizing limitations and respecting pain Nuro-Re-ed:  Gait Training:      PATIENT EDUCATION:  Education details: Anatomy of condition, POC, HEP, exercise form/rationale Person educated: Patient Education method: Explanation, Demonstration, Tactile cues, and Verbal cues Education comprehension: verbalized understanding, returned demonstration, verbal  cues required, tactile cues required, and needs further education  HOME EXERCISE PROGRAM: Long axis distraction to Lt LE BID [id number]    ASSESSMENT:  CLINICAL IMPRESSION: Continues to maintain ROM with tightness with cervical rotation to the left. Less muscle spasm in cervical region today but had notable stiffness through left rib cage and tightness through periscapular/thoracic paraspinal musculature. Doing well with HEP and is progressing lifting in daily activities. Going to [employer] and plans to be careful about rides and activities.     OBJECTIVE IMPAIRMENTS: decreased activity tolerance, decreased mobility, difficulty walking, decreased strength, increased muscle spasms, improper body mechanics, postural dysfunction, and  pain.   ACTIVITY LIMITATIONS: carrying, lifting, bending, sitting, standing, squatting, sleeping, stairs, transfers, bed mobility, bathing, dressing, locomotion level, and caring for others  PERSONAL FACTORS: Time since onset of injury/illness/exacerbation and osteopenia are also affecting patient's functional outcome.   REHAB POTENTIAL: Good  CLINICAL DECISION MAKING: Evolving/moderate complexity  EVALUATION COMPLEXITY: Moderate   GOALS: Goals reviewed with patient? Yes  SHORT TERM GOALS: Target date: 12/30  Perform HEP to manage pain through car trip Baseline: Goal status: achieved    LONG TERM GOALS: Target date:  POC date  Able to demo proper hip hinge to ankles Baseline: now able to reach distal 1/3 of tibia Goal status: partially met  2.  Bil hip abd strength 5/5 Baseline: <10lb difference Rt to Lt Goal status: achieved  3.  Progressing appropriate core HEP Baseline: requires progression Goal status: ongoing   4.  Meet FOTO goal Baseline: see obj Goal status: deferred- FOTO ended  5. Pt will be able to perform light jogging for short distances to play with grandson  Goal status: has done well with it so far  6. Pt will obtain strength and awareness for perform necessary rotational control on the Z axis for return to exercise in golf and bowling.   Goal status: has not tried at this point    PLAN:  PT FREQUENCY: 1-2x/week  PT DURATION: 6 weeks  PLANNED INTERVENTIONS: 97164- PT Re-evaluation, 97110-Therapeutic exercises, 97530- Therapeutic activity, 97112- Neuromuscular re-education, 97535- Self Care, [id number]- Manual therapy, [phone]- Gait training, [phone]- Aquatic Therapy, Patient/Family education, Balance training, Stair training, Taping, Dry Needling, Joint mobilization, Spinal mobilization, Cryotherapy, and Moist heat.  PLAN FOR NEXT SESSION: TrX  Ray Gervasi C. Labrea Eccleston PT, DPT 08/06/23 12:36 PM    Referring diagnosis? M54.16 Treatment diagnosis? (if  different than referring diagnosis) M54.59  R29.3 What was this (referring dx) caused by? []  Surgery []  Fall [x]  Ongoing issue []  Arthritis []  Other: ____________  Laterality: []  Rt []  Lt [x]  Both  Check all possible CPT codes:  *CHOOSE 10 OR LESS*    See Planned Interventions listed in the Plan section of the Evaluation.

## 2023-08-20 ENCOUNTER — Telehealth: Payer: Self-pay

## 2023-08-20 NOTE — Telephone Encounter (Signed)
 Pt was notified that all medications have refills

## 2023-08-20 NOTE — Telephone Encounter (Signed)
 Copied from CRM [id number]. Topic: Clinical - Medication Question >> Aug 20, 2023  8:28 AM Revonda D wrote: Reason for CRM: Pt had to reschedule her appt due to provider not being in office until August and is wanting to know if her medications will still be approved and refilled until the visit. Pt would like a callback regarding this concern.

## 2023-08-20 NOTE — Telephone Encounter (Signed)
 Yes.  All medications should be refilled during my absence since people need to reschedule appts.

## 2023-08-28 ENCOUNTER — Ambulatory Visit: Payer: Medicare PPO | Admitting: Family Medicine

## 2023-09-10 ENCOUNTER — Ambulatory Visit (HOSPITAL_BASED_OUTPATIENT_CLINIC_OR_DEPARTMENT_OTHER): Payer: Medicare PPO | Attending: Family Medicine | Admitting: Physical Therapy

## 2023-09-10 ENCOUNTER — Encounter (HOSPITAL_BASED_OUTPATIENT_CLINIC_OR_DEPARTMENT_OTHER): Payer: Self-pay | Admitting: Physical Therapy

## 2023-09-10 DIAGNOSIS — M5442 Lumbago with sciatica, left side: Secondary | ICD-10-CM | POA: Insufficient documentation

## 2023-09-10 DIAGNOSIS — M5459 Other low back pain: Secondary | ICD-10-CM | POA: Diagnosis not present

## 2023-09-10 DIAGNOSIS — M542 Cervicalgia: Secondary | ICD-10-CM | POA: Insufficient documentation

## 2023-09-10 DIAGNOSIS — R293 Abnormal posture: Secondary | ICD-10-CM | POA: Insufficient documentation

## 2023-09-10 NOTE — Therapy (Signed)
 OUTPATIENT PHYSICAL THERAPY TREATMENT   Patient Name: Dominique Harrell MRN: [id number] DOB:1955/04/18, 68 y.o., female Today's Date: 09/10/2023  END OF SESSION:  PT End of Session - 09/10/23 1128     Visit Number 15    Number of Visits 16    Date for PT Re-Evaluation 10/13/23    Authorization Type [employer] MCR    Authorization Time Period 3.3.25  -8.30.25    Authorization - Visit Number 5    Authorization - Number of Visits 6    PT Start Time 1128    PT Stop Time 1208    PT Time Calculation (min) 40 min    Activity Tolerance Patient tolerated treatment well    Behavior During Therapy WFL for tasks assessed/performed                Past Medical History:  Diagnosis Date   Hyperlipidemia    Insomnia    Migraine    Osteopenia    Past Surgical History:  Procedure Laterality Date   COLONOSCOPY  08/14/2007   w/Dr.Stark   excision of liposarcoma thigh Left    removed at [facility]; drop in Blood pressure per pt   OOPHORECTOMY  1999   left, dermoid cyst   Patient Active Problem List   Diagnosis Date Noted   Lumbar back pain with radiculopathy affecting right lower extremity 04/07/2015   Osteoporosis, 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   General medical examination 03/02/2011   SHOULDER IMPINGEMENT SYNDROME, LEFT 02/15/2010   Hyperlipidemia 06/08/2006   Depression 06/08/2006   MIGRAINE HEADACHE 06/08/2006     REFERRING PROVIDER:  Mahlon Comer BRAVO, MD     REFERRING DIAG:  M54.16 (ICD-10-CM) - Lumbar back pain with radiculopathy affecting right lower extremity     Rationale for Evaluation and Treatment: Rehabilitation  THERAPY DIAG:  Other low back pain  Abnormal posture  Cervicalgia  Acute left-sided low back pain with left-sided sciatica  ONSET DATE: approx 12/13/22   SUBJECTIVE:                                                                                                                                                                                            SUBJECTIVE STATEMENT:  Did well at [facility] and went to the beach for a week. I did really well until the last day with the beach chair and I felt it when I tried to stand up. I feel like I am overall in a good place. I can tell my right arm is weaker but overall I feel stronger. Denies HA & N/T. I don't notice back pain when I am in the car but  I know it could go out at any second.   PERTINENT HISTORY:  Chronic spinal pain, osteopenia  PAIN:  Are you having pain? Yes: NPRS scale: 0/10 Pain location: across lower back Pain description: stiff Aggravating factors: sitting too long Relieving factors: move around  PRECAUTIONS:  None  RED FLAGS: None   WEIGHT BEARING RESTRICTIONS:  No  FALLS:  Has patient fallen in last 6 months? No    PATIENT GOALS:  Decrease pain, move easily   OBJECTIVE:  Note: Objective measures were completed at Evaluation unless otherwise noted.  PATIENT SURVEYS:  FOTO 51 1/6: 56 - Foto ended 5/19: NDI 11/50 6/23: NDI 14/50 7/28: NDI 11/50  COGNITIVE STATUS: Within functional limits for tasks assessed   SENSATION: Eval: some N/T down legs 12/20: no s/s of nerve in last week  POSTURE:  Eval: Lt post /Rt ant innom rotation.   HAND DOMINANCE:  Left   Body Part #1 Lumbar  PALPATION: Lt post/Rt ant inom rotation at eval  Sidelying hip strength: Lt 28.5 lb  Rt 33.8 lb  2/25 Lt  30.1     Rt  28.0 lb  Cervical ROM: 2/25: side bend Lt 28, Rt 22; flx 50, ext 24; rotation Rt 46, Lt 44  Cervical ROM: 3/29: side bend Lt 22, Rt 25; flx 48, ext 34; rotation Rt 50, Lt 42- pre tx; 50/52 post tx  Cervical ROM: 5/19: side bend Lt 28, Rt 24; flx 42, ext 30; rotation Rt 48, Lt 48  Cervical ROM: 6/23: side bend Lt 30, Rt 28; flx 36, ext 28; rotation Rt 46, Lt 50  Cervical ROM: 7/28: side bend Lt 22, Rt 20; flx 36, ext 32; rotation Rt 48, Lt 48     After manual SB: Lt 30 Rt 30    TREATMENT:                                                                                                                                Treatment                            09/10/23: Blank lines following charge title = not provided on this treatment date.   Manual:  TPDN No STM Rt suboccipitals, Lt upper trap, Lt levator There-ex: Discussed yoga vs tai chi, running/jogging all to address OP There-Act:  Self Care: Discussed posture while at beach, amusement parks Nuro-Re-ed:  Gait Training:    Treatment                            6/23: Blank lines following charge title = not provided on this treatment date.   Manual:  TPDN No Prone STM bilateral upper traps, levator scap, rhomboids, subscap, thoracic paraspinals Gross rib mobilizations, scapular glides- paired with STM Self Care: Discussion of posture- tends to lean to the right when  seated on the couch Stiffness and muscle spasm    Treatment                            5/19: Blank lines following charge title = not provided on this treatment date.   Manual:  TPDN YES Trigger Point Dry Needling  Subsequent Treatment: Instructions provided previously at initial dry needling treatment.   Patient Verbal Consent Given: Yes Education Handout Provided: Previously Provided Muscles Treated: bilateral upper traps & levator scap Electrical Stimulation Performed: No Treatment Response/Outcome: twitch with decreased concordant tension Prone rib mobs- most notable limitations on left side, mid-thoracic There-ex:  There-Act:  Self Care: Reviewed importance of recognizing limitations and respecting pain Nuro-Re-ed:  Gait Training:      PATIENT EDUCATION:  Education details: Anatomy of condition, POC, HEP, exercise form/rationale Person educated: Patient Education method: Explanation, Demonstration, Tactile cues, and Verbal cues Education comprehension: verbalized understanding, returned demonstration, verbal cues required, tactile cues required, and needs  further education  HOME EXERCISE PROGRAM: Long axis distraction to Lt LE BID [id number]    ASSESSMENT:  CLINICAL IMPRESSION: Pt did not require TPDN today. Arrived with ROM that was good in equality Rt to Lt but very tight in sidebend. Discussed gentle thoracic dextroscoliosis is going to result in increased tension of levator scapula and will require long-term attention to ensure compensatory patterns to do not set in. Planning to go to an amusement park tomorrow and will continue to do her routine of stretches. Encouraged pt to begin more resistance exercise and is a member at [employer]. Pt has 1 more authorized visit allowed so I would like to f/u in 1 month after she has a chance to try activities and ensure that she was able to be successful with anatomical anomalies present that place her at higher risk for injury.     OBJECTIVE IMPAIRMENTS: decreased activity tolerance, decreased mobility, difficulty walking, decreased strength, increased muscle spasms, improper body mechanics, postural dysfunction, and pain.   ACTIVITY LIMITATIONS: carrying, lifting, bending, sitting, standing, squatting, sleeping, stairs, transfers, bed mobility, bathing, dressing, locomotion level, and caring for others  PERSONAL FACTORS: Time since onset of injury/illness/exacerbation and osteopenia are also affecting patient's functional outcome.   REHAB POTENTIAL: Good  CLINICAL DECISION MAKING: Evolving/moderate complexity  EVALUATION COMPLEXITY: Moderate   GOALS: Goals reviewed with patient? Yes  SHORT TERM GOALS: Target date: 12/30  Perform HEP to manage pain through car trip Baseline: Goal status: achieved    LONG TERM GOALS: Target date:  POC date  Able to demo proper hip hinge to ankles Baseline: now able to reach distal 1/3 of tibia Goal status: partially met  2.  Bil hip abd strength 5/5 Baseline: <10lb difference Rt to Lt Goal status: achieved  3.  Progressing appropriate core  HEP Baseline: requires progression Goal status: ongoing   4.  Meet FOTO goal Baseline: see obj Goal status: deferred- FOTO ended, using NDI- see OBJ  5. Pt will be able to perform light jogging for short distances to play with grandson  Goal status: has done well with it so far  6. Pt will obtain strength and awareness for perform necessary rotational control on the Z axis for return to exercise in golf and bowling.   Goal status: has not tried at this point    PLAN:  PT FREQUENCY: 1-2x/week  PT DURATION: 6 weeks  PLANNED INTERVENTIONS: 97164- PT Re-evaluation, 97110-Therapeutic exercises, 97530- Therapeutic activity, [id number]- Neuromuscular  re-education, [phone]- Self Care, 02859- Manual therapy, [phone]- Gait training, [phone]- Aquatic Therapy, Patient/Family education, Balance training, Stair training, Taping, Dry Needling, Joint mobilization, Spinal mobilization, Cryotherapy, and Moist heat.  PLAN FOR NEXT SESSION: TrX  Ryun Velez C. Ashanti Littles PT, DPT 09/10/23 12:19 PM    Referring diagnosis? M54.16 Treatment diagnosis? (if different than referring diagnosis) M54.59  R29.3 What was this (referring dx) caused by? []  Surgery []  Fall [x]  Ongoing issue []  Arthritis []  Other: ____________  Laterality: []  Rt []  Lt [x]  Both  Check all possible CPT codes:  *CHOOSE 10 OR LESS*    See Planned Interventions listed in the Plan section of the Evaluation.

## 2023-09-27 ENCOUNTER — Ambulatory Visit: Admitting: Family Medicine

## 2023-09-27 ENCOUNTER — Encounter: Payer: Self-pay | Admitting: Family Medicine

## 2023-09-27 VITALS — BP 110/70 | HR 57 | Temp 98.0°F | Wt 115.8 lb

## 2023-09-27 DIAGNOSIS — E785 Hyperlipidemia, unspecified: Secondary | ICD-10-CM

## 2023-09-27 LAB — LIPID PANEL
Cholesterol: 163 mg/dL (ref 0–200)
HDL: 68.2 mg/dL (ref >39.00)
LDL Cholesterol: 82 mg/dL (ref 0–99)
NonHDL: 95.08
Total CHOL/HDL Ratio: 2
Triglycerides: 67 mg/dL (ref 0.0–149.0)
VLDL: 13.4 mg/dL (ref 0.0–40.0)

## 2023-09-27 LAB — HEPATIC FUNCTION PANEL
ALT: 19 U/L (ref 0–35)
AST: 19 U/L (ref 0–37)
Albumin: 4.6 g/dL (ref 3.5–5.2)
Alkaline Phosphatase: 39 U/L (ref 39–117)
Bilirubin, Direct: 0.2 mg/dL (ref 0.0–0.3)
Total Bilirubin: 0.8 mg/dL (ref 0.2–1.2)
Total Protein: 7.1 g/dL (ref 6.0–8.3)

## 2023-09-27 LAB — BASIC METABOLIC PANEL WITH GFR
BUN: 15 mg/dL (ref 6–23)
CO2: 32 meq/L (ref 19–32)
Calcium: 9.2 mg/dL (ref 8.4–10.5)
Chloride: 104 meq/L (ref 96–112)
Creatinine, Ser: 0.68 mg/dL (ref 0.40–1.20)
GFR: 89.39 mL/min (ref >60.00)
Glucose, Bld: 85 mg/dL (ref 70–99)
Potassium: 4 meq/L (ref 3.5–5.1)
Sodium: 142 meq/L (ref 135–145)

## 2023-09-27 LAB — TSH: TSH: 1.67 u[IU]/mL (ref 0.35–5.50)

## 2023-09-27 NOTE — Patient Instructions (Signed)
 Schedule your complete physical in 6 months We'll notify you of your lab results and make any changes if needed Keep up the good work on healthy diet and regular exercise- you look AMAZING!! Call with any questions or concerns Stay Safe!  Stay Healthy! ENJOY [place] !!!

## 2023-09-27 NOTE — Progress Notes (Signed)
   Subjective:    Patient ID: Dominique Harrell, female    DOB: 11/14/1955, 68 y.o.   MRN: [id number]  HPI Hyperlipidemia- chronic problem, on Simvastatin  40mg  daily.  Denies CP, SOB, abd pain, N/V.  Continues to walk regularly.   Review of Systems For ROS see HPI     Objective:   Physical Exam Vitals reviewed.  Constitutional:      General: She is not in acute distress.    Appearance: Normal appearance. She is well-developed. She is not ill-appearing.  HENT:     Head: Normocephalic and atraumatic.  Eyes:     Conjunctiva/sclera: Conjunctivae normal.     Pupils: Pupils are equal, round, and reactive to light.  Neck:     Thyroid : No thyromegaly.  Cardiovascular:     Rate and Rhythm: Normal rate and regular rhythm.     Pulses: Normal pulses.     Heart sounds: Normal heart sounds. No murmur heard. Pulmonary:     Effort: Pulmonary effort is normal. No respiratory distress.     Breath sounds: Normal breath sounds.  Abdominal:     General: There is no distension.     Palpations: Abdomen is soft.     Tenderness: There is no abdominal tenderness.  Musculoskeletal:     Cervical back: Normal range of motion and neck supple.  Lymphadenopathy:     Cervical: No cervical adenopathy.  Skin:    General: Skin is warm and dry.  Neurological:     General: No focal deficit present.     Mental Status: She is alert and oriented to person, place, and time.  Psychiatric:        Mood and Affect: Mood normal.        Behavior: Behavior normal.        Thought Content: Thought content normal.           Assessment & Plan:

## 2023-09-27 NOTE — Assessment & Plan Note (Signed)
 Chronic problem.  On Simvastatin  40mg  daily w/o difficulty.  Check labs.  Adjust meds prn

## 2023-09-28 ENCOUNTER — Ambulatory Visit: Payer: Self-pay | Admitting: Family Medicine

## 2023-10-08 ENCOUNTER — Encounter (HOSPITAL_BASED_OUTPATIENT_CLINIC_OR_DEPARTMENT_OTHER): Payer: Self-pay | Admitting: Physical Therapy

## 2023-10-08 ENCOUNTER — Ambulatory Visit (HOSPITAL_BASED_OUTPATIENT_CLINIC_OR_DEPARTMENT_OTHER): Payer: Medicare PPO | Attending: Family Medicine | Admitting: Physical Therapy

## 2023-10-08 DIAGNOSIS — M542 Cervicalgia: Secondary | ICD-10-CM | POA: Insufficient documentation

## 2023-10-08 DIAGNOSIS — M5459 Other low back pain: Secondary | ICD-10-CM | POA: Diagnosis not present

## 2023-10-08 DIAGNOSIS — R293 Abnormal posture: Secondary | ICD-10-CM | POA: Diagnosis not present

## 2023-10-08 NOTE — Therapy (Unsigned)
 OUTPATIENT PHYSICAL THERAPY TREATMENT   Patient Name: Dominique Harrell MRN: [id number] DOB:[birth year], 68 y.o., female Today's Date: 10/09/2023  END OF SESSION:  PT End of Session - 10/08/23 1144     Visit Number 16    Number of Visits 22    Date for PT Re-Evaluation 04/10/24    Authorization Type [employer] MCR    Authorization Time Period 3.3.25  -8.30.25    Authorization - Visit Number 6    Authorization - Number of Visits 6    PT Start Time 1144    PT Stop Time 1215    PT Time Calculation (min) 31 min    Activity Tolerance Patient tolerated treatment well    Behavior During Therapy WFL for tasks assessed/performed                Past Medical History:  Diagnosis Date   Hyperlipidemia    Insomnia    Migraine    Osteopenia    Past Surgical History:  Procedure Laterality Date   COLONOSCOPY  08/14/2007   w/Dr.Stark   excision of liposarcoma thigh Left    removed at [facility]; drop in Blood pressure per pt   OOPHORECTOMY  1999   left, dermoid cyst   Patient Active Problem List   Diagnosis Date Noted   Lumbar back pain with radiculopathy affecting right lower extremity 04/07/2015   Osteoporosis, 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   General medical examination 03/02/2011   SHOULDER IMPINGEMENT SYNDROME, LEFT 02/15/2010   Hyperlipidemia 06/08/2006   Depression 06/08/2006   MIGRAINE HEADACHE 06/08/2006     REFERRING PROVIDER:  Mahlon Comer BRAVO, MD     REFERRING DIAG:  M54.16 (ICD-10-CM) - Lumbar back pain with radiculopathy affecting right lower extremity     Rationale for Evaluation and Treatment: Rehabilitation  THERAPY DIAG:  Other low back pain  Abnormal posture  Cervicalgia  ONSET DATE: approx 12/13/22   SUBJECTIVE:                                                                                                                                                                                           SUBJECTIVE STATEMENT: Same ache in Lt upper  trap, mid-thoracic spine and lumbar but not debilitating. Making good choices about my activities. Denies HA, N/T.  I have enjoyed the monthly check ins as I start feeling the effects of the pain but it does not have a chance to be debilitating.    PERTINENT HISTORY:  Chronic spinal pain, osteopenia  PAIN:  Are you having pain? Yes: NPRS scale: 0/10 Pain location: across lower back Pain description: stiff Aggravating factors: sitting too long Relieving  factors: move around  PRECAUTIONS:  None  RED FLAGS: None   WEIGHT BEARING RESTRICTIONS:  No  FALLS:  Has patient fallen in last 6 months? No    PATIENT GOALS:  Decrease pain, move easily   OBJECTIVE:  Note: Objective measures were completed at Evaluation unless otherwise noted.  PATIENT SURVEYS:  FOTO 51 1/6: 56 - Foto ended 5/19: NDI 11/50 6/23: NDI 14/50 7/28: NDI 11/50 8/25: NDI 15/50  COGNITIVE STATUS: Within functional limits for tasks assessed   SENSATION: Eval: some N/T down legs 12/20: no s/s of nerve in last week  POSTURE:  Eval: Lt post /Rt ant innom rotation.   HAND DOMINANCE:  Left   Body Part #1 Lumbar  PALPATION: Lt post/Rt ant inom rotation at eval  Sidelying hip strength: Lt 28.5 lb  Rt 33.8 lb  2/25 Lt  30.1     Rt  28.0 lb  Cervical ROM: 2/25: side bend Lt 28, Rt 22; flx 50, ext 24; rotation Rt 46, Lt 44  Cervical ROM: 3/29: side bend Lt 22, Rt 25; flx 48, ext 34; rotation Rt 50, Lt 42- pre tx; 50/52 post tx  Cervical ROM: 5/19: side bend Lt 28, Rt 24; flx 42, ext 30; rotation Rt 48, Lt 48  Cervical ROM: 6/23: side bend Lt 30, Rt 28; flx 36, ext 28; rotation Rt 46, Lt 50  Cervical ROM: 7/28: side bend Lt 22, Rt 20; flx 36, ext 32; rotation Rt 48, Lt 48     After manual SB: Lt 30 Rt 30  Cervical ROM: 8/25: side bend Lt 25, Rt 26; flx 26, ext 28; rotation Rt 44, Lt 48     After manual rotation 46 & 48   TREATMENT:                                                                                                                                Treatment                            8/25: Blank lines following charge title = not provided on this treatment date.   Manual:  TPDN No Prone gross rib mobilization- most stiffness noted in Rt upper and Lt lower posterior cage STM Rt upper trap, Lt rhomboids There-ex:  There-Act:  Self Care: Discussed current activities and modifications with awareness of pain/limitation Nuro-Re-ed:  Gait Training:    Treatment                            09/10/23: Blank lines following charge title = not provided on this treatment date.   Manual:  TPDN No STM Rt suboccipitals, Lt upper trap, Lt levator There-ex: Discussed yoga vs tai chi, running/jogging all to address OP There-Act:  Self Care: Discussed posture while at beach, amusement parks Nuro-Re-ed:  Gait Training:  Treatment                            6/23: Blank lines following charge title = not provided on this treatment date.   Manual:  TPDN No Prone STM bilateral upper traps, levator scap, rhomboids, subscap, thoracic paraspinals Gross rib mobilizations, scapular glides- paired with STM Self Care: Discussion of posture- tends to lean to the right when seated on the couch Stiffness and muscle spasm    Treatment                            5/19: Blank lines following charge title = not provided on this treatment date.   Manual:  TPDN YES Trigger Point Dry Needling  Subsequent Treatment: Instructions provided previously at initial dry needling treatment.   Patient Verbal Consent Given: Yes Education Handout Provided: Previously Provided Muscles Treated: bilateral upper traps & levator scap Electrical Stimulation Performed: No Treatment Response/Outcome: twitch with decreased concordant tension Prone rib mobs- most notable limitations on left side, mid-thoracic There-ex:  There-Act:  Self Care: Reviewed importance of recognizing  limitations and respecting pain Nuro-Re-ed:  Gait Training:      PATIENT EDUCATION:  Education details: Anatomy of condition, POC, HEP, exercise form/rationale Person educated: Patient Education method: Explanation, Demonstration, Tactile cues, and Verbal cues Education comprehension: verbalized understanding, returned demonstration, verbal cues required, tactile cues required, and needs further education  HOME EXERCISE PROGRAM: Long axis distraction to Lt LE BID [id number]    ASSESSMENT:  CLINICAL IMPRESSION: Pt continues to do well being very aware of her required limitations to avoid further spasm. Pt has maintained a lot of her ROM and functional strength/endurance is doing well. Resting discomfort making sleep uncomfortable and could easily move into spasm if she is not careful during her activities. I do believe that monthly follow ups are very beneficial to check mechanics and discuss HEP as she lives a very active lifestyle. Will extend plan of care to request continued monthly check ups to avoid injury or need for more significant medical intervention.     OBJECTIVE IMPAIRMENTS: decreased activity tolerance, decreased mobility, difficulty walking, decreased strength, increased muscle spasms, improper body mechanics, postural dysfunction, and pain.   ACTIVITY LIMITATIONS: carrying, lifting, bending, sitting, standing, squatting, sleeping, stairs, transfers, bed mobility, bathing, dressing, locomotion level, and caring for others  PERSONAL FACTORS: Time since onset of injury/illness/exacerbation and osteopenia are also affecting patient's functional outcome.   REHAB POTENTIAL: Good  CLINICAL DECISION MAKING: Evolving/moderate complexity  EVALUATION COMPLEXITY: Moderate   GOALS: Goals reviewed with patient? Yes  SHORT TERM GOALS: Target date: 12/30  Perform HEP to manage pain through car trip Baseline: Goal status: achieved    LONG TERM GOALS: Target date:  POC  date  Able to demo proper hip hinge to ankles Baseline: now able to reach distal 1/3 of tibia Goal status: partially met  2.  Bil hip abd strength 5/5 Baseline: <10lb difference Rt to Lt Goal status: achieved  3.  Progressing appropriate core HEP Baseline: requires progression Goal status: ongoing   4.  Meet FOTO goal Baseline: see obj Goal status: deferred- FOTO ended, using NDI- see OBJ  5. Pt will be able to perform light jogging for short distances to play with grandson  Goal status: doing fine, I do not do the trampoline 8/25  6. Pt will obtain strength and awareness for perform necessary rotational control  on the Z axis for return to exercise in golf and bowling.   Goal status: has not tried at this point 8/25    PLAN:  PT FREQUENCY: 1-2x/week  PT DURATION: 6 weeks  PLANNED INTERVENTIONS: 97164- PT Re-evaluation, 97110-Therapeutic exercises, 97530- Therapeutic activity, 97112- Neuromuscular re-education, 97535- Self Care, 02859- Manual therapy, [phone]- Gait training, [phone]- Aquatic Therapy, Patient/Family education, Balance training, Stair training, Taping, Dry Needling, Joint mobilization, Spinal mobilization, Cryotherapy, and Moist heat.  PLAN FOR NEXT SESSION: TrX  Naoko Diperna C. Hien Cunliffe PT, DPT 10/09/23 1:33 PM    Referring diagnosis? M54.16 Treatment diagnosis? (if different than referring diagnosis) M54.59  R29.3 What was this (referring dx) caused by? []  Surgery []  Fall [x]  Ongoing issue []  Arthritis []  Other: ____________  Laterality: []  Rt []  Lt [x]  Both  Check all possible CPT codes:  *CHOOSE 10 OR LESS*    See Planned Interventions listed in the Plan section of the Evaluation.

## 2023-11-05 ENCOUNTER — Encounter (HOSPITAL_BASED_OUTPATIENT_CLINIC_OR_DEPARTMENT_OTHER): Admitting: Physical Therapy

## 2023-12-03 ENCOUNTER — Ambulatory Visit (HOSPITAL_BASED_OUTPATIENT_CLINIC_OR_DEPARTMENT_OTHER): Attending: Family Medicine | Admitting: Physical Therapy

## 2023-12-03 ENCOUNTER — Encounter (HOSPITAL_BASED_OUTPATIENT_CLINIC_OR_DEPARTMENT_OTHER): Payer: Self-pay | Admitting: Physical Therapy

## 2023-12-03 DIAGNOSIS — R293 Abnormal posture: Secondary | ICD-10-CM | POA: Diagnosis not present

## 2023-12-03 DIAGNOSIS — M542 Cervicalgia: Secondary | ICD-10-CM | POA: Diagnosis not present

## 2023-12-03 DIAGNOSIS — M5459 Other low back pain: Secondary | ICD-10-CM | POA: Insufficient documentation

## 2023-12-03 NOTE — Therapy (Signed)
 OUTPATIENT PHYSICAL THERAPY TREATMENT   Patient Name: Dominique Harrell MRN: [id number] DOB:Feb 21, 1955, 68 y.o., female Today's Date: 12/03/2023  END OF SESSION:  PT End of Session - 12/03/23 1055     Visit Number 17    Number of Visits 22    Date for Recertification  04/10/24    Authorization Type [employer] [facility]    Authorization Time Period 9.1.25 - 2.28.26    Authorization - Visit Number 2    Authorization - Number of Visits 6    PT Start Time 1100    PT Stop Time 1145    PT Time Calculation (min) 45 min    Activity Tolerance Patient tolerated treatment well    Behavior During Therapy WFL for tasks assessed/performed                Past Medical History:  Diagnosis Date   Hyperlipidemia    Insomnia    Migraine    Osteopenia    Past Surgical History:  Procedure Laterality Date   COLONOSCOPY  08/14/2007   w/Dr.Stark   excision of liposarcoma thigh Left    removed at [facility]; drop in Blood pressure per pt   OOPHORECTOMY  1999   left, dermoid cyst   Patient Active Problem List   Diagnosis Date Noted   Lumbar back pain with radiculopathy affecting right lower extremity 04/07/2015   Osteoporosis, 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   General medical examination 03/02/2011   SHOULDER IMPINGEMENT SYNDROME, LEFT 02/15/2010   Hyperlipidemia 06/08/2006   Depression 06/08/2006   MIGRAINE HEADACHE 06/08/2006     REFERRING PROVIDER:  Mahlon Comer BRAVO, MD     REFERRING DIAG:  M54.16 (ICD-10-CM) - Lumbar back pain with radiculopathy affecting right lower extremity     Rationale for Evaluation and Treatment: Rehabilitation  THERAPY DIAG:  Other low back pain  Abnormal posture  Cervicalgia  ONSET DATE: approx 12/13/22  SUBJECTIVE:                                                                                                                                                                                           SUBJECTIVE STATEMENT: A lot of walking and a  lot of steps on the cruise. My low back did go out when I went to [place] for a class reunion- it didn't go out all the way and it only took me about 3 days to get back on track. All I was doing was getting up off of the couch. I can tell my neck is a little tight.    PERTINENT HISTORY:  Chronic spinal pain, osteopenia  PAIN:  Are you having pain? Yes:  NPRS scale: 0/10 Pain location: across lower back Pain description: stiff Aggravating factors: sitting too long Relieving factors: move around  PRECAUTIONS:  None  RED FLAGS: None   WEIGHT BEARING RESTRICTIONS:  No  FALLS:  Has patient fallen in last 6 months? No    PATIENT GOALS:  Decrease pain, move easily   OBJECTIVE:  Note: Objective measures were completed at Evaluation unless otherwise noted.  PATIENT SURVEYS:  FOTO 51 1/6: 56 - Foto ended 5/19: NDI 11/50 6/23: NDI 14/50 7/28: NDI 11/50 8/25: NDI 15/50 10/20: NDI 16/50  COGNITIVE STATUS: Within functional limits for tasks assessed   SENSATION: Eval: some N/T down legs 12/20: no s/s of nerve in last week 10/20: nerve symptoms down Lt leg when back when out most recently  POSTURE:  Eval: Lt post /Rt ant innom rotation.   HAND DOMINANCE:  Left   Body Part #1 Lumbar  PALPATION: Lt post/Rt ant inom rotation at eval  Sidelying hip strength: Lt 28.5 lb  Rt 33.8 lb  2/25 Lt  30.1     Rt  28.0 lb  Cervical ROM: 2/25: side bend Lt 28, Rt 22; flx 50, ext 24; rotation Rt 46, Lt 44  Cervical ROM: 3/29: side bend Lt 22, Rt 25; flx 48, ext 34; rotation Rt 50, Lt 42- pre tx; 50/52 post tx  Cervical ROM: 5/19: side bend Lt 28, Rt 24; flx 42, ext 30; rotation Rt 48, Lt 48  Cervical ROM: 6/23: side bend Lt 30, Rt 28; flx 36, ext 28; rotation Rt 46, Lt 50  Cervical ROM: 7/28: side bend Lt 22, Rt 20; flx 36, ext 32; rotation Rt 48, Lt 48     After manual SB: Lt 30 Rt 30  Cervical ROM: 8/25: side bend Lt 25, Rt 26; flx 26, ext 28; rotation Rt 44, Lt 48      After manual rotation 46 & 48  Cervical ROM: 12/03/23: sidebend Lt 30 Rt 22; Flx 28  Ext 30  ;Rotation Rt 46  Lt 50  After Manual sidebend: Lt 30  Rt 30    TREATMENT:                                                                                                                               Treatment                            10/20: Blank lines following charge title = not provided on this treatment date.   Manual:  TPDN YES Trigger Point Dry Needling  Subsequent Treatment: Instructions provided previously at initial dry needling treatment.   Patient Verbal Consent Given: Yes Education Handout Provided: Previously Provided Muscles Treated: bilateral upper traps Electrical Stimulation Performed: No Treatment Response/Outcome: twitch with decreased concordant tension Prone rib mobs  Supine STM, suboccipital release Rt cervical sidebend with Rt-sided closing mobs grade 4 There-ex:  There-Act: Discussion  regarding ADLs such as travelling, exercise while away, sleeping & pillows Self Care:  Nuro-Re-ed:  Gait Training:    Treatment                            8/25: Blank lines following charge title = not provided on this treatment date.   Manual:  TPDN No Prone gross rib mobilization- most stiffness noted in Rt upper and Lt lower posterior cage STM Rt upper trap, Lt rhomboids There-ex:  There-Act:  Self Care: Discussed current activities and modifications with awareness of pain/limitation Nuro-Re-ed:  Gait Training:    Treatment                            09/10/23: Blank lines following charge title = not provided on this treatment date.   Manual:  TPDN No STM Rt suboccipitals, Lt upper trap, Lt levator There-ex: Discussed yoga vs tai chi, running/jogging all to address OP There-Act:  Self Care: Discussed posture while at beach, amusement parks Nuro-Re-ed:  Gait Training:    Treatment                            6/23: Blank lines following charge  title = not provided on this treatment date.   Manual:  TPDN No Prone STM bilateral upper traps, levator scap, rhomboids, subscap, thoracic paraspinals Gross rib mobilizations, scapular glides- paired with STM Self Care: Discussion of posture- tends to lean to the right when seated on the couch Stiffness and muscle spasm     PATIENT EDUCATION:  Education details: Anatomy of condition, POC, HEP, exercise form/rationale Person educated: Patient Education method: Explanation, Demonstration, Tactile cues, and Verbal cues Education comprehension: verbalized understanding, returned demonstration, verbal cues required, tactile cues required, and needs further education  HOME EXERCISE PROGRAM: Long axis distraction to Lt LE BID [id number]    ASSESSMENT:  CLINICAL IMPRESSION: Resolution of Rt scapular winging following manual therapy. Pt did have an episode of pain in her back since her last visit. She was successful in decreasing pain/spasm in a matter of days vs weeks of severe pain that previously would have limited her ability to travel. Pt was able to maintain a neutral pelvic alignment but experienced pain into cervical spine as expected with biomechanical chain interaction. Pt is consistent with doing exercises when she is not travelling and trying to do little things and postural adaptions when she is travelling. Will continue to benefit from monthly appointments to address limitations as they pop up while returning to PLOF.     OBJECTIVE IMPAIRMENTS: decreased activity tolerance, decreased mobility, difficulty walking, decreased strength, increased muscle spasms, improper body mechanics, postural dysfunction, and pain.   ACTIVITY LIMITATIONS: carrying, lifting, bending, sitting, standing, squatting, sleeping, stairs, transfers, bed mobility, bathing, dressing, locomotion level, and caring for others  PERSONAL FACTORS: Time since onset of injury/illness/exacerbation and osteopenia are  also affecting patient's functional outcome.   REHAB POTENTIAL: Good  CLINICAL DECISION MAKING: Evolving/moderate complexity  EVALUATION COMPLEXITY: Moderate   GOALS: Goals reviewed with patient? Yes  SHORT TERM GOALS: Target date: 12/30  Perform HEP to manage pain through car trip Baseline: Goal status: achieved    LONG TERM GOALS: Target date:  POC date  Able to demo proper hip hinge to ankles Baseline: now able to reach distal 1/3 of tibia Goal status: partially met  2.  Bil  hip abd strength 5/5 Baseline: <10lb difference Rt to Lt Goal status: achieved  3.  Progressing appropriate core HEP Baseline: requires progression Goal status: ongoing   4.  Meet FOTO goal Baseline: see obj Goal status: deferred- FOTO ended, using NDI- see OBJ  5. Pt will be able to perform light jogging for short distances to play with grandson  Goal status: doing fine, I do not do the trampoline 8/25  6. Pt will obtain strength and awareness for perform necessary rotational control on the Z axis for return to exercise in golf and bowling.   Goal status: has not tried at this point 8/25    PLAN:  PT FREQUENCY: 1-2x/week  PT DURATION: 6 weeks  PLANNED INTERVENTIONS: 97164- PT Re-evaluation, 97110-Therapeutic exercises, 97530- Therapeutic activity, 97112- Neuromuscular re-education, 97535- Self Care, 02859- Manual therapy, [phone]- Gait training, [phone]- Aquatic Therapy, Patient/Family education, Balance training, Stair training, Taping, Dry Needling, Joint mobilization, Spinal mobilization, Cryotherapy, and Moist heat.  PLAN FOR NEXT SESSION: TrX  Sasan Wilkie C. Harlynn Kimbell PT, DPT 12/03/23 12:50 PM    Referring diagnosis? M54.16 Treatment diagnosis? (if different than referring diagnosis) M54.59  R29.3 What was this (referring dx) caused by? []  Surgery []  Fall [x]  Ongoing issue []  Arthritis []  Other: ____________  Laterality: []  Rt []  Lt [x]  Both  Check all possible CPT  codes:  *CHOOSE 10 OR LESS*    See Planned Interventions listed in the Plan section of the Evaluation.

## 2023-12-05 ENCOUNTER — Other Ambulatory Visit: Payer: Self-pay | Admitting: Family Medicine

## 2023-12-05 DIAGNOSIS — E785 Hyperlipidemia, unspecified: Secondary | ICD-10-CM

## 2023-12-31 DIAGNOSIS — L720 Epidermal cyst: Secondary | ICD-10-CM | POA: Diagnosis not present

## 2023-12-31 DIAGNOSIS — L570 Actinic keratosis: Secondary | ICD-10-CM | POA: Diagnosis not present

## 2023-12-31 DIAGNOSIS — L821 Other seborrheic keratosis: Secondary | ICD-10-CM | POA: Diagnosis not present

## 2024-01-07 ENCOUNTER — Encounter (HOSPITAL_BASED_OUTPATIENT_CLINIC_OR_DEPARTMENT_OTHER): Payer: Self-pay | Admitting: Physical Therapy

## 2024-01-07 ENCOUNTER — Ambulatory Visit (HOSPITAL_BASED_OUTPATIENT_CLINIC_OR_DEPARTMENT_OTHER): Attending: Family Medicine | Admitting: Physical Therapy

## 2024-01-07 DIAGNOSIS — M542 Cervicalgia: Secondary | ICD-10-CM | POA: Insufficient documentation

## 2024-01-07 DIAGNOSIS — R293 Abnormal posture: Secondary | ICD-10-CM | POA: Diagnosis not present

## 2024-01-07 DIAGNOSIS — M5459 Other low back pain: Secondary | ICD-10-CM | POA: Insufficient documentation

## 2024-01-07 NOTE — Therapy (Signed)
 OUTPATIENT PHYSICAL THERAPY TREATMENT   Patient Name: Dominique Harrell MRN: [id number] DOB:05-Jul-1955, 68 y.o., female Today's Date: 01/07/2024  END OF SESSION:  PT End of Session - 01/07/24 1101     Visit Number 18    Number of Visits 22    Date for Recertification  04/10/24    Authorization Type [employer] [facility]    Authorization Time Period 9.1.25 - 2.28.26    Authorization - Visit Number 3    Authorization - Number of Visits 6    PT Start Time 1101    PT Stop Time 1129    PT Time Calculation (min) 28 min    Activity Tolerance Patient tolerated treatment well    Behavior During Therapy WFL for tasks assessed/performed                Past Medical History:  Diagnosis Date   Hyperlipidemia    Insomnia    Migraine    Osteopenia    Past Surgical History:  Procedure Laterality Date   COLONOSCOPY  08/14/2007   w/Dr.Stark   excision of liposarcoma thigh Left    removed at [facility]; drop in Blood pressure per pt   OOPHORECTOMY  1999   left, dermoid cyst   Patient Active Problem List   Diagnosis Date Noted   Lumbar back pain with radiculopathy affecting right lower extremity 04/07/2015   Osteoporosis, 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   General medical examination 03/02/2011   SHOULDER IMPINGEMENT SYNDROME, LEFT 02/15/2010   Hyperlipidemia 06/08/2006   Depression 06/08/2006   MIGRAINE HEADACHE 06/08/2006     REFERRING PROVIDER:  Mahlon Comer BRAVO, MD     REFERRING DIAG:  M54.16 (ICD-10-CM) - Lumbar back pain with radiculopathy affecting right lower extremity     Rationale for Evaluation and Treatment: Rehabilitation  THERAPY DIAG:  Abnormal posture  Cervicalgia  Other low back pain  ONSET DATE: approx 12/13/22  SUBJECTIVE:                                                                                                                                                                                           SUBJECTIVE STATEMENT: Was on a retreat for  confirmation class and did well. Did not have return of pain after last time. Denies HA.   PERTINENT HISTORY:  Chronic spinal pain, osteopenia  PAIN:  Are you having pain? Yes: NPRS scale: 0/10 Pain location: across lower back Pain description: stiff Aggravating factors: sitting too long Relieving factors: move around  PRECAUTIONS:  None  RED FLAGS: None   WEIGHT BEARING RESTRICTIONS:  No  FALLS:  Has patient fallen in last 6 months? No  PATIENT GOALS:  Decrease pain, move easily   OBJECTIVE:  Note: Objective measures were completed at Evaluation unless otherwise noted.  PATIENT SURVEYS:  FOTO 51 1/6: 56 - Foto ended 5/19: NDI 11/50 6/23: NDI 14/50 7/28: NDI 11/50 8/25: NDI 15/50 10/20: NDI 16/50  COGNITIVE STATUS: Within functional limits for tasks assessed   SENSATION: Eval: some N/T down legs 12/20: no s/s of nerve in last week 10/20: nerve symptoms down Lt leg when back when out most recently  POSTURE:  Eval: Lt post /Rt ant innom rotation.   HAND DOMINANCE:  Left   Body Part #1 Lumbar  PALPATION: Lt post/Rt ant inom rotation at eval  Sidelying hip strength: Lt 28.5 lb  Rt 33.8 lb  2/25 Lt  30.1     Rt  28.0 lb  Cervical ROM: 2/25: side bend Lt 28, Rt 22; flx 50, ext 24; rotation Rt 46, Lt 44  Cervical ROM: 3/29: side bend Lt 22, Rt 25; flx 48, ext 34; rotation Rt 50, Lt 42- pre tx; 50/52 post tx  Cervical ROM: 5/19: side bend Lt 28, Rt 24; flx 42, ext 30; rotation Rt 48, Lt 48  Cervical ROM: 6/23: side bend Lt 30, Rt 28; flx 36, ext 28; rotation Rt 46, Lt 50  Cervical ROM: 7/28: side bend Lt 22, Rt 20; flx 36, ext 32; rotation Rt 48, Lt 48     After manual SB: Lt 30 Rt 30  Cervical ROM: 8/25: side bend Lt 25, Rt 26; flx 26, ext 28; rotation Rt 44, Lt 48     After manual rotation 46 & 48  Cervical ROM: 12/03/23: sidebend Lt 30 Rt 22; Flx 28  Ext 30  ;Rotation Rt 46  Lt 50  After Manual sidebend: Lt 30  Rt 30  Cervical  ROM: 01/07/24: sidebend Lt 18 Rt 18;  Flx 43  Ext 38  ;Rotation Rt 40  Lt 40  After Manual sidebend: Lt 26  Rt 24    TREATMENT:                                                                                                                               Treatment                            11/24: Blank lines following charge title = not provided on this treatment date.   Manual:  TPDN No STM bilateral upper trap & levator Passive cervical sidebend Bend & stretch, contract/relax to decrease upper trap spasm There-ex: Contract relax with sheet over shoulder to upper trap There-Act:  Self Care:  Nuro-Re-ed:  Gait Training:    Treatment                            10/20: Blank lines following charge title = not provided on this treatment date.  Manual:  TPDN YES Trigger Point Dry Needling  Subsequent Treatment: Instructions provided previously at initial dry needling treatment.   Patient Verbal Consent Given: Yes Education Handout Provided: Previously Provided Muscles Treated: bilateral upper traps Electrical Stimulation Performed: No Treatment Response/Outcome: twitch with decreased concordant tension Prone rib mobs  Supine STM, suboccipital release Rt cervical sidebend with Rt-sided closing mobs grade 4 There-ex:  There-Act: Discussion regarding ADLs such as travelling, exercise while away, sleeping & pillows Self Care:  Nuro-Re-ed:  Gait Training:    Treatment                            8/25: Blank lines following charge title = not provided on this treatment date.   Manual:  TPDN No Prone gross rib mobilization- most stiffness noted in Rt upper and Lt lower posterior cage STM Rt upper trap, Lt rhomboids There-ex:  There-Act:  Self Care: Discussed current activities and modifications with awareness of pain/limitation Nuro-Re-ed:  Gait Training:    Treatment                            09/10/23: Blank lines following charge title = not provided  on this treatment date.   Manual:  TPDN No STM Rt suboccipitals, Lt upper trap, Lt levator There-ex: Discussed yoga vs tai chi, running/jogging all to address OP There-Act:  Self Care: Discussed posture while at beach, amusement parks Nuro-Re-ed:  Gait Training:    Treatment                            6/23: Blank lines following charge title = not provided on this treatment date.   Manual:  TPDN No Prone STM bilateral upper traps, levator scap, rhomboids, subscap, thoracic paraspinals Gross rib mobilizations, scapular glides- paired with STM Self Care: Discussion of posture- tends to lean to the right when seated on the couch Stiffness and muscle spasm     PATIENT EDUCATION:  Education details: Anatomy of condition, POC, HEP, exercise form/rationale Person educated: Patient Education method: Explanation, Demonstration, Tactile cues, and Verbal cues Education comprehension: verbalized understanding, returned demonstration, verbal cues required, tactile cues required, and needs further education  HOME EXERCISE PROGRAM: Long axis distraction to Lt LE BID [id number]    ASSESSMENT:  CLINICAL IMPRESSION: Able to improve sidebend by treating muscular tension- joints moving well indicated by allowance of rotation and flx/ext prior to treatment.     OBJECTIVE IMPAIRMENTS: decreased activity tolerance, decreased mobility, difficulty walking, decreased strength, increased muscle spasms, improper body mechanics, postural dysfunction, and pain.   ACTIVITY LIMITATIONS: carrying, lifting, bending, sitting, standing, squatting, sleeping, stairs, transfers, bed mobility, bathing, dressing, locomotion level, and caring for others  PERSONAL FACTORS: Time since onset of injury/illness/exacerbation and osteopenia are also affecting patient's functional outcome.   REHAB POTENTIAL: Good  CLINICAL DECISION MAKING: Evolving/moderate complexity  EVALUATION COMPLEXITY:  Moderate   GOALS: Goals reviewed with patient? Yes  SHORT TERM GOALS: Target date: 12/30  Perform HEP to manage pain through car trip Baseline: Goal status: achieved    LONG TERM GOALS: Target date:  POC date  Able to demo proper hip hinge to ankles Baseline: now able to reach distal 1/3 of tibia Goal status: partially met  2.  Bil hip abd strength 5/5 Baseline: <10lb difference Rt to Lt Goal status: achieved  3.  Progressing appropriate core HEP Baseline:  requires progression Goal status: ongoing   4.  Meet FOTO goal Baseline: see obj Goal status: deferred- FOTO ended, using NDI- see OBJ  5. Pt will be able to perform light jogging for short distances to play with grandson  Goal status: doing fine, I do not do the trampoline 8/25  6. Pt will obtain strength and awareness for perform necessary rotational control on the Z axis for return to exercise in golf and bowling.   Goal status: has not tried at this point 8/25    PLAN:  PT FREQUENCY: 1-2x/week  PT DURATION: 6 weeks  PLANNED INTERVENTIONS: 97164- PT Re-evaluation, 97110-Therapeutic exercises, 97530- Therapeutic activity, 97112- Neuromuscular re-education, 97535- Self Care, 02859- Manual therapy, [phone]- Gait training, [phone]- Aquatic Therapy, Patient/Family education, Balance training, Stair training, Taping, Dry Needling, Joint mobilization, Spinal mobilization, Cryotherapy, and Moist heat.  PLAN FOR NEXT SESSION: TrX  Elenie Coven C. Teancum Brule PT, DPT 01/07/24 11:33 AM    Referring diagnosis? M54.16 Treatment diagnosis? (if different than referring diagnosis) M54.59  R29.3 What was this (referring dx) caused by? []  Surgery []  Fall [x]  Ongoing issue []  Arthritis []  Other: ____________  Laterality: []  Rt []  Lt [x]  Both  Check all possible CPT codes:  *CHOOSE 10 OR LESS*    See Planned Interventions listed in the Plan section of the Evaluation.

## 2024-02-04 ENCOUNTER — Encounter (HOSPITAL_BASED_OUTPATIENT_CLINIC_OR_DEPARTMENT_OTHER): Admitting: Physical Therapy

## 2024-02-05 ENCOUNTER — Encounter (HOSPITAL_BASED_OUTPATIENT_CLINIC_OR_DEPARTMENT_OTHER): Payer: Self-pay | Admitting: Physical Therapy

## 2024-02-05 ENCOUNTER — Ambulatory Visit (HOSPITAL_BASED_OUTPATIENT_CLINIC_OR_DEPARTMENT_OTHER): Payer: Self-pay | Attending: Family Medicine | Admitting: Physical Therapy

## 2024-02-05 DIAGNOSIS — R293 Abnormal posture: Secondary | ICD-10-CM | POA: Insufficient documentation

## 2024-02-05 DIAGNOSIS — M542 Cervicalgia: Secondary | ICD-10-CM | POA: Insufficient documentation

## 2024-02-05 NOTE — Therapy (Signed)
 " OUTPATIENT PHYSICAL THERAPY TREATMENT   Patient Name: Dominique Harrell MRN: [id number] DOB:June 08, 1955, 68 y.o., female Today's Date: 02/05/2024  END OF SESSION:  PT End of Session - 02/05/24 1102     Visit Number 19    Number of Visits 22    Date for Recertification  04/10/24    Authorization Type [employer] [facility]    Authorization Time Period 9.1.25 - 2.28.26    Authorization - Visit Number 4    Authorization - Number of Visits 6    PT Start Time 1100    PT Stop Time 1124    PT Time Calculation (min) 24 min    Activity Tolerance Patient tolerated treatment well    Behavior During Therapy WFL for tasks assessed/performed                Past Medical History:  Diagnosis Date   Hyperlipidemia    Insomnia    Migraine    Osteopenia    Past Surgical History:  Procedure Laterality Date   COLONOSCOPY  08/14/2007   w/Dr.Stark   excision of liposarcoma thigh Left    removed at [facility]; drop in Blood pressure per pt   OOPHORECTOMY  1999   left, dermoid cyst   Patient Active Problem List   Diagnosis Date Noted   Lumbar back pain with radiculopathy affecting right lower extremity 04/07/2015   Osteoporosis, 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   General medical examination 03/02/2011   SHOULDER IMPINGEMENT SYNDROME, LEFT 02/15/2010   Hyperlipidemia 06/08/2006   Depression 06/08/2006   MIGRAINE HEADACHE 06/08/2006     REFERRING PROVIDER:  Mahlon Comer BRAVO, MD     REFERRING DIAG:  M54.16 (ICD-10-CM) - Lumbar back pain with radiculopathy affecting right lower extremity     Rationale for Evaluation and Treatment: Rehabilitation  THERAPY DIAG:  Abnormal posture  Cervicalgia  ONSET DATE: approx 12/13/22  SUBJECTIVE:                                                                                                                                                                                           SUBJECTIVE STATEMENT: Have been doing a lot of holiday stuff. Mid back  get sore doing the baking, wrapping etc.   PERTINENT HISTORY:  Chronic spinal pain, osteopenia  PAIN:  Are you having pain? Yes: NPRS scale: 0/10 Pain location: across lower back Pain description: stiff Aggravating factors: sitting too long Relieving factors: move around  PRECAUTIONS:  None  RED FLAGS: None   WEIGHT BEARING RESTRICTIONS:  No  FALLS:  Has patient fallen in last 6 months? No    PATIENT GOALS:  Decrease pain, move  easily   OBJECTIVE:  Note: Objective measures were completed at Evaluation unless otherwise noted.  PATIENT SURVEYS:  FOTO 51 1/6: 56 - Foto ended 5/19: NDI 11/50 6/23: NDI 14/50 7/28: NDI 11/50 8/25: NDI 15/50 10/20: NDI 16/50  COGNITIVE STATUS: Within functional limits for tasks assessed   SENSATION: Eval: some N/T down legs 12/20: no s/s of nerve in last week 10/20: nerve symptoms down Lt leg when back when out most recently  POSTURE:  Eval: Lt post /Rt ant innom rotation.   HAND DOMINANCE:  Left   Body Part #1 Lumbar  PALPATION: Lt post/Rt ant inom rotation at eval  Sidelying hip strength: Lt 28.5 lb  Rt 33.8 lb  2/25 Lt  30.1     Rt  28.0 lb  Cervical ROM: 2/25: side bend Lt 28, Rt 22; flx 50, ext 24; rotation Rt 46, Lt 44  Cervical ROM: 3/29: side bend Lt 22, Rt 25; flx 48, ext 34; rotation Rt 50, Lt 42- pre tx; 50/52 post tx  Cervical ROM: 5/19: side bend Lt 28, Rt 24; flx 42, ext 30; rotation Rt 48, Lt 48  Cervical ROM: 6/23: side bend Lt 30, Rt 28; flx 36, ext 28; rotation Rt 46, Lt 50  Cervical ROM: 7/28: side bend Lt 22, Rt 20; flx 36, ext 32; rotation Rt 48, Lt 48     After manual SB: Lt 30 Rt 30  Cervical ROM: 8/25: side bend Lt 25, Rt 26; flx 26, ext 28; rotation Rt 44, Lt 48     After manual rotation 46 & 48  Cervical ROM: 12/03/23: sidebend Lt 30 Rt 22; Flx 28  Ext 30  ;Rotation Rt 46  Lt 50  After Manual sidebend: Lt 30  Rt 30  Cervical ROM: 01/07/24: sidebend Lt 18 Rt 18;  Flx 43  Ext  38  ;Rotation Rt 40  Lt 40  After Manual sidebend: Lt 26  Rt 24  Cervical ROM: 01/07/24: sidebend Lt 24 Rt 24; Rotation Rt 46  Lt 50  After Manual sidebend: Lt 28  Rt 28   TREATMENT:                                                                                                                               Treatment                            12/23: Blank lines following charge title = not provided on this treatment date.   Manual:  TPDN No STM Lt upper trap, levator scap Suboccipital release There-ex:  There-Act:  Self Care: Discussed continued movement & management Nuro-Re-ed:  Gait Training:    Treatment                            11/24: Blank lines following charge title = not provided on this  treatment date.   Manual:  TPDN No STM bilateral upper trap & levator Passive cervical sidebend Bend & stretch, contract/relax to decrease upper trap spasm There-ex: Contract relax with sheet over shoulder to upper trap There-Act:  Self Care:  Nuro-Re-ed:  Gait Training:    Treatment                            10/20: Blank lines following charge title = not provided on this treatment date.   Manual:  TPDN YES Trigger Point Dry Needling  Subsequent Treatment: Instructions provided previously at initial dry needling treatment.   Patient Verbal Consent Given: Yes Education Handout Provided: Previously Provided Muscles Treated: bilateral upper traps Electrical Stimulation Performed: No Treatment Response/Outcome: twitch with decreased concordant tension Prone rib mobs  Supine STM, suboccipital release Rt cervical sidebend with Rt-sided closing mobs grade 4 There-ex:  There-Act: Discussion regarding ADLs such as travelling, exercise while away, sleeping & pillows Self Care:  Nuro-Re-ed:  Gait Training:    Treatment                            8/25: Blank lines following charge title = not provided on this treatment date.   Manual:  TPDN No Prone  gross rib mobilization- most stiffness noted in Rt upper and Lt lower posterior cage STM Rt upper trap, Lt rhomboids There-ex:  There-Act:  Self Care: Discussed current activities and modifications with awareness of pain/limitation Nuro-Re-ed:  Gait Training:    Treatment                            09/10/23: Blank lines following charge title = not provided on this treatment date.   Manual:  TPDN No STM Rt suboccipitals, Lt upper trap, Lt levator There-ex: Discussed yoga vs tai chi, running/jogging all to address OP There-Act:  Self Care: Discussed posture while at beach, amusement parks Nuro-Re-ed:  Gait Training:    Treatment                            6/23: Blank lines following charge title = not provided on this treatment date.   Manual:  TPDN No Prone STM bilateral upper traps, levator scap, rhomboids, subscap, thoracic paraspinals Gross rib mobilizations, scapular glides- paired with STM Self Care: Discussion of posture- tends to lean to the right when seated on the couch Stiffness and muscle spasm     PATIENT EDUCATION:  Education details: Anatomy of condition, POC, HEP, exercise form/rationale Person educated: Patient Education method: Explanation, Demonstration, Tactile cues, and Verbal cues Education comprehension: verbalized understanding, returned demonstration, verbal cues required, tactile cues required, and needs further education  HOME EXERCISE PROGRAM: Long axis distraction to Lt LE BID [id number]    ASSESSMENT:  CLINICAL IMPRESSION: Pt continues to improve carryover month to month and has been able to avoid spasms through neck, thoracic and low back regions. Has been less diligent with exercises as the holidays are busy but she has done very well with habits to continue with carryover.     OBJECTIVE IMPAIRMENTS: decreased activity tolerance, decreased mobility, difficulty walking, decreased strength, increased muscle spasms, improper  body mechanics, postural dysfunction, and pain.   ACTIVITY LIMITATIONS: carrying, lifting, bending, sitting, standing, squatting, sleeping, stairs, transfers, bed mobility, bathing, dressing, locomotion level, and caring for others  PERSONAL  FACTORS: Time since onset of injury/illness/exacerbation and osteopenia are also affecting patient's functional outcome.   REHAB POTENTIAL: Good  CLINICAL DECISION MAKING: Evolving/moderate complexity  EVALUATION COMPLEXITY: Moderate   GOALS: Goals reviewed with patient? Yes  SHORT TERM GOALS: Target date: 12/30  Perform HEP to manage pain through car trip Baseline: Goal status: achieved    LONG TERM GOALS: Target date:  POC date  Able to demo proper hip hinge to ankles Baseline: now able to reach distal 1/3 of tibia Goal status: partially met  2.  Bil hip abd strength 5/5 Baseline: <10lb difference Rt to Lt Goal status: achieved  3.  Progressing appropriate core HEP Baseline: requires progression Goal status: ongoing   4.  Meet FOTO goal Baseline: see obj Goal status: deferred- FOTO ended, using NDI- see OBJ  5. Pt will be able to perform light jogging for short distances to play with grandson  Goal status: doing fine, I do not do the trampoline 8/25  6. Pt will obtain strength and awareness for perform necessary rotational control on the Z axis for return to exercise in golf and bowling.   Goal status: has not tried at this point 8/25    PLAN:  PT FREQUENCY: 1-2x/week  PT DURATION: 6 weeks  PLANNED INTERVENTIONS: 97164- PT Re-evaluation, 97110-Therapeutic exercises, 97530- Therapeutic activity, 97112- Neuromuscular re-education, 97535- Self Care, 02859- Manual therapy, [phone]- Gait training, [phone]- Aquatic Therapy, Patient/Family education, Balance training, Stair training, Taping, Dry Needling, Joint mobilization, Spinal mobilization, Cryotherapy, and Moist heat.  PLAN FOR NEXT SESSION: TrX  Harriette Tovey C. Lafe Clerk PT,  DPT 02/05/2024 11:40 AM    Referring diagnosis? M54.16 Treatment diagnosis? (if different than referring diagnosis) M54.59  R29.3 What was this (referring dx) caused by? []  Surgery []  Fall [x]  Ongoing issue []  Arthritis []  Other: ____________  Laterality: []  Rt []  Lt [x]  Both  Check all possible CPT codes:  *CHOOSE 10 OR LESS*    See Planned Interventions listed in the Plan section of the Evaluation.   "

## 2024-02-12 ENCOUNTER — Other Ambulatory Visit: Payer: Self-pay | Admitting: Family Medicine

## 2024-03-12 ENCOUNTER — Encounter (HOSPITAL_BASED_OUTPATIENT_CLINIC_OR_DEPARTMENT_OTHER): Payer: Self-pay | Admitting: Physical Therapy

## 2024-03-12 ENCOUNTER — Ambulatory Visit (HOSPITAL_BASED_OUTPATIENT_CLINIC_OR_DEPARTMENT_OTHER): Attending: Family Medicine | Admitting: Physical Therapy

## 2024-03-12 DIAGNOSIS — M5459 Other low back pain: Secondary | ICD-10-CM | POA: Diagnosis present

## 2024-03-12 DIAGNOSIS — R293 Abnormal posture: Secondary | ICD-10-CM | POA: Insufficient documentation

## 2024-03-12 DIAGNOSIS — M542 Cervicalgia: Secondary | ICD-10-CM | POA: Insufficient documentation

## 2024-03-12 NOTE — Therapy (Signed)
 " OUTPATIENT PHYSICAL THERAPY TREATMENT   Patient Name: Dominique Harrell MRN: [id number] DOB:1955-11-06, 69 y.o., female Today's Date: 03/12/2024  END OF SESSION:  PT End of Session - 03/12/24 0928     Visit Number 20    Number of Visits 22    Date for Recertification  04/10/24    Authorization Type [employer] [facility]    Authorization Time Period 9.1.25 - 2.28.26    Authorization - Visit Number 5    Authorization - Number of Visits 6    PT Start Time [phone]    PT Stop Time 0956    PT Time Calculation (min) 28 min    Activity Tolerance Patient tolerated treatment well    Behavior During Therapy [facility] for tasks assessed/performed                 Past Medical History:  Diagnosis Date   Hyperlipidemia    Insomnia    Migraine    Osteopenia    Past Surgical History:  Procedure Laterality Date   COLONOSCOPY  08/14/2007   w/Dr.Stark   excision of liposarcoma thigh Left    removed at [facility]; drop in Blood pressure per pt   OOPHORECTOMY  1999   left, dermoid cyst   Patient Active Problem List   Diagnosis Date Noted   Lumbar back pain with radiculopathy affecting right lower extremity 04/07/2015   Osteoporosis, 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   General medical examination 03/02/2011   SHOULDER IMPINGEMENT SYNDROME, LEFT 02/15/2010   Hyperlipidemia 06/08/2006   Depression 06/08/2006   MIGRAINE HEADACHE 06/08/2006     REFERRING PROVIDER:  Mahlon Comer BRAVO, MD     REFERRING DIAG:  M54.16 (ICD-10-CM) - Lumbar back pain with radiculopathy affecting right lower extremity     Rationale for Evaluation and Treatment: Rehabilitation  THERAPY DIAG:  Abnormal posture  Cervicalgia  Other low back pain  ONSET DATE: approx 12/13/22  SUBJECTIVE:                                                                                                                                                                                           SUBJECTIVE STATEMENT: I am just taking more  breaks when I am doing things. I had one time last week where the pain went right across the mid back but it feels fine today.   PERTINENT HISTORY:  Chronic spinal pain, osteopenia  PAIN:  Are you having pain? Yes: NPRS scale: 0/10 Pain location: across lower back Pain description: stiff Aggravating factors: sitting too long Relieving factors: move around  PRECAUTIONS:  None  RED FLAGS: None   WEIGHT BEARING RESTRICTIONS:  No  FALLS:  Has patient fallen in last 6 months? No    PATIENT GOALS:  Decrease pain, move easily   OBJECTIVE:  Note: Objective measures were completed at Evaluation unless otherwise noted.  PATIENT SURVEYS:  FOTO 51 1/6: 56 - Foto ended 5/19: NDI 11/50 6/23: NDI 14/50 7/28: NDI 11/50 8/25: NDI 15/50 10/20: NDI 16/50 03/12/24: NDI 18/50  COGNITIVE STATUS: Within functional limits for tasks assessed   SENSATION: Eval: some N/T down legs 12/20: no s/s of nerve in last week 10/20: nerve symptoms down Lt leg when back when out most recently  POSTURE:  Eval: Lt post /Rt ant innom rotation.   HAND DOMINANCE:  Left   Body Part #1 Lumbar  PALPATION: Lt post/Rt ant inom rotation at eval  Sidelying hip strength: Lt 28.5 lb  Rt 33.8 lb  2/25 Lt  30.1     Rt  28.0 lb  Cervical ROM: 2/25: side bend Lt 28, Rt 22; flx 50, ext 24; rotation Rt 46, Lt 44  Cervical ROM: 3/29: side bend Lt 22, Rt 25; flx 48, ext 34; rotation Rt 50, Lt 42- pre tx; 50/52 post tx  Cervical ROM: 5/19: side bend Lt 28, Rt 24; flx 42, ext 30; rotation Rt 48, Lt 48  Cervical ROM: 6/23: side bend Lt 30, Rt 28; flx 36, ext 28; rotation Rt 46, Lt 50  Cervical ROM: 7/28: side bend Lt 22, Rt 20; flx 36, ext 32; rotation Rt 48, Lt 48     After manual SB: Lt 30 Rt 30  Cervical ROM: 8/25: side bend Lt 25, Rt 26; flx 26, ext 28; rotation Rt 44, Lt 48     After manual rotation 46 & 48  Cervical ROM: 12/03/23: sidebend Lt 30 Rt 22; Flx 28  Ext 30  ;Rotation Rt 46  Lt  50  After Manual sidebend: Lt 30  Rt 30  Cervical ROM: 01/07/24: sidebend Lt 18 Rt 18;  Flx 43  Ext 38  ;Rotation Rt 40  Lt 40  After Manual sidebend: Lt 26  Rt 24  Cervical ROM: 01/07/24: sidebend Lt 24 Rt 24; Rotation Rt 46  Lt 50  After Manual sidebend: Lt 28  Rt 28  Cervical ROM: 03/12/24: sidebend Lt 26 Rt 26; Rotation Rt 48  Lt 48  After Manual sidebend: Lt 30  Rt 30; Rotation Rt 52  Lt 52   TREATMENT:                                                                                                                               Treatment                            1/28: Blank lines following charge title = not provided on this treatment date.   Manual:  TPDN No Prone rib mobs- Rt lower quadrant focus Lt first rib depression STM bilateral  upper trap There-ex:  There-Act:  Self Care:  Nuro-Re-ed: Prone glut+core+ shoulder flexion Review of exercise frequency Gait Training:    Treatment                            12/23: Blank lines following charge title = not provided on this treatment date.   Manual:  TPDN No STM Lt upper trap, levator scap Suboccipital release There-ex:  There-Act:  Self Care: Discussed continued movement & management Nuro-Re-ed:  Gait Training:    Treatment                            11/24: Blank lines following charge title = not provided on this treatment date.   Manual:  TPDN No STM bilateral upper trap & levator Passive cervical sidebend Bend & stretch, contract/relax to decrease upper trap spasm There-ex: Contract relax with sheet over shoulder to upper trap There-Act:  Self Care:  Nuro-Re-ed:  Gait Training:    Treatment                            10/20: Blank lines following charge title = not provided on this treatment date.   Manual:  TPDN YES Trigger Point Dry Needling  Subsequent Treatment: Instructions provided previously at initial dry needling treatment.   Patient Verbal Consent Given:  Yes Education Handout Provided: Previously Provided Muscles Treated: bilateral upper traps Electrical Stimulation Performed: No Treatment Response/Outcome: twitch with decreased concordant tension Prone rib mobs  Supine STM, suboccipital release Rt cervical sidebend with Rt-sided closing mobs grade 4 There-ex:  There-Act: Discussion regarding ADLs such as travelling, exercise while away, sleeping & pillows Self Care:  Nuro-Re-ed:  Gait Training:    PATIENT EDUCATION:  Education details: Teacher, Music of condition, POC, HEP, exercise form/rationale Person educated: Patient Education method: Explanation, Demonstration, Tactile cues, and Verbal cues Education comprehension: verbalized understanding, returned demonstration, verbal cues required, tactile cues required, and needs further education  HOME EXERCISE PROGRAM: Long axis distraction to Lt LE BID [id number]    ASSESSMENT:  CLINICAL IMPRESSION: Rt lower, post quadrant of rib cage continues to return to a stiff posture and improved with manual therapy. Added extension activation exercise to HEP without large range and cues to avoid cervical activation. ROM carryover and improvement notable and discussed the importance of exercise for stabilizing range.     OBJECTIVE IMPAIRMENTS: decreased activity tolerance, decreased mobility, difficulty walking, decreased strength, increased muscle spasms, improper body mechanics, postural dysfunction, and pain.   ACTIVITY LIMITATIONS: carrying, lifting, bending, sitting, standing, squatting, sleeping, stairs, transfers, bed mobility, bathing, dressing, locomotion level, and caring for others  PERSONAL FACTORS: Time since onset of injury/illness/exacerbation and osteopenia are also affecting patient's functional outcome.   REHAB POTENTIAL: Good  CLINICAL DECISION MAKING: Evolving/moderate complexity  EVALUATION COMPLEXITY: Moderate   GOALS: Goals reviewed with patient? Yes  SHORT TERM  GOALS: Target date: 12/30  Perform HEP to manage pain through car trip Baseline: Goal status: achieved    LONG TERM GOALS: Target date:  POC date  Able to demo proper hip hinge to ankles Baseline: now able to reach distal 1/3 of tibia Goal status: partially met  2.  Bil hip abd strength 5/5 Baseline: <10lb difference Rt to Lt Goal status: achieved  3.  Progressing appropriate core HEP Baseline: requires progression Goal status: ongoing   4.  Meet [employer]  goal Baseline: see obj Goal status: deferred- FOTO ended, using NDI- see OBJ  5. Pt will be able to perform light jogging for short distances to play with grandson  Goal status: doing fine, I do not do the trampoline 8/25  6. Pt will obtain strength and awareness for perform necessary rotational control on the Z axis for return to exercise in golf and bowling.   Goal status: has not tried at this point 8/25    PLAN:  PT FREQUENCY: 1-2x/week  PT DURATION: 6 weeks  PLANNED INTERVENTIONS: 97164- PT Re-evaluation, 97110-Therapeutic exercises, 97530- Therapeutic activity, 97112- Neuromuscular re-education, 97535- Self Care, 02859- Manual therapy, [phone]- Gait training, [phone]- Aquatic Therapy, Patient/Family education, Balance training, Stair training, Taping, Dry Needling, Joint mobilization, Spinal mobilization, Cryotherapy, and Moist heat.  PLAN FOR NEXT SESSION: TrX  Ramon Zanders C. Alexsa Flaum PT, DPT 03/12/24 10:05 AM    Referring diagnosis? M54.16 Treatment diagnosis? (if different than referring diagnosis) M54.59  R29.3 What was this (referring dx) caused by? []  Surgery []  Fall [x]  Ongoing issue []  Arthritis []  Other: ____________  Laterality: []  Rt []  Lt [x]  Both  Check all possible CPT codes:  *CHOOSE 10 OR LESS*    See Planned Interventions listed in the Plan section of the Evaluation.   "

## 2024-03-13 ENCOUNTER — Encounter (HOSPITAL_BASED_OUTPATIENT_CLINIC_OR_DEPARTMENT_OTHER): Admitting: Physical Therapy

## 2024-03-31 ENCOUNTER — Encounter (HOSPITAL_BASED_OUTPATIENT_CLINIC_OR_DEPARTMENT_OTHER): Admitting: Physical Therapy

## 2024-04-03 ENCOUNTER — Encounter: Admitting: Family Medicine

## 2024-05-05 ENCOUNTER — Encounter (HOSPITAL_BASED_OUTPATIENT_CLINIC_OR_DEPARTMENT_OTHER): Admitting: Physical Therapy
